# Patient Record
Sex: Female | Born: 1985 | Race: White | Hispanic: No | Marital: Married | Smoking: Never smoker
Health system: Southern US, Community
[De-identification: ages and names within clinical notes are randomized; demographics above are authoritative.]

## PROBLEM LIST (undated history)

## (undated) ENCOUNTER — Inpatient Hospital Stay (HOSPITAL_COMMUNITY): Payer: Self-pay

## (undated) ENCOUNTER — Inpatient Hospital Stay (HOSPITAL_COMMUNITY): Payer: Managed Care, Other (non HMO)

## (undated) DIAGNOSIS — IMO0002 Reserved for concepts with insufficient information to code with codable children: Secondary | ICD-10-CM

## (undated) DIAGNOSIS — O24419 Gestational diabetes mellitus in pregnancy, unspecified control: Secondary | ICD-10-CM

## (undated) DIAGNOSIS — R51 Headache: Secondary | ICD-10-CM

## (undated) DIAGNOSIS — F419 Anxiety disorder, unspecified: Secondary | ICD-10-CM

## (undated) DIAGNOSIS — T4145XA Adverse effect of unspecified anesthetic, initial encounter: Secondary | ICD-10-CM

## (undated) DIAGNOSIS — N83209 Unspecified ovarian cyst, unspecified side: Secondary | ICD-10-CM

## (undated) DIAGNOSIS — R569 Unspecified convulsions: Secondary | ICD-10-CM

## (undated) DIAGNOSIS — G4700 Insomnia, unspecified: Secondary | ICD-10-CM

## (undated) DIAGNOSIS — M5416 Radiculopathy, lumbar region: Secondary | ICD-10-CM

## (undated) DIAGNOSIS — T8859XA Other complications of anesthesia, initial encounter: Secondary | ICD-10-CM

## (undated) DIAGNOSIS — N390 Urinary tract infection, site not specified: Secondary | ICD-10-CM

## (undated) DIAGNOSIS — R87612 Low grade squamous intraepithelial lesion on cytologic smear of cervix (LGSIL): Secondary | ICD-10-CM

## (undated) HISTORY — PX: CYST EXCISION: SHX5701

## (undated) HISTORY — PX: OTHER SURGICAL HISTORY: SHX169

## (undated) HISTORY — PX: THERAPEUTIC ABORTION: SHX798

## (undated) HISTORY — DX: Anxiety disorder, unspecified: F41.9

---
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
## 2005-01-14 ENCOUNTER — Ambulatory Visit (HOSPITAL_COMMUNITY): Admission: RE | Admit: 2005-01-14 | Discharge: 2005-01-14 | Payer: Self-pay | Admitting: Gynecology

## 2005-01-26 ENCOUNTER — Emergency Department: Payer: Self-pay | Admitting: Emergency Medicine

## 2005-03-13 ENCOUNTER — Ambulatory Visit (HOSPITAL_COMMUNITY): Admission: RE | Admit: 2005-03-13 | Discharge: 2005-03-13 | Payer: Self-pay | Admitting: Gynecology

## 2005-04-04 ENCOUNTER — Observation Stay (HOSPITAL_COMMUNITY): Admission: AD | Admit: 2005-04-04 | Discharge: 2005-04-05 | Payer: Self-pay | Admitting: Gynecology

## 2005-04-14 ENCOUNTER — Inpatient Hospital Stay (HOSPITAL_COMMUNITY): Admission: AD | Admit: 2005-04-14 | Discharge: 2005-04-17 | Payer: Self-pay | Admitting: Gynecology

## 2005-04-14 ENCOUNTER — Observation Stay: Payer: Self-pay

## 2005-04-16 ENCOUNTER — Encounter (INDEPENDENT_AMBULATORY_CARE_PROVIDER_SITE_OTHER): Payer: Self-pay | Admitting: *Deleted

## 2005-12-19 ENCOUNTER — Ambulatory Visit: Payer: Self-pay | Admitting: Family Medicine

## 2005-12-19 ENCOUNTER — Inpatient Hospital Stay (HOSPITAL_COMMUNITY): Admission: AD | Admit: 2005-12-19 | Discharge: 2005-12-20 | Payer: Self-pay | Admitting: Family Medicine

## 2005-12-24 ENCOUNTER — Ambulatory Visit: Payer: Self-pay | Admitting: Family Medicine

## 2006-01-07 ENCOUNTER — Ambulatory Visit: Payer: Self-pay | Admitting: Family Medicine

## 2006-04-15 ENCOUNTER — Encounter: Payer: Self-pay | Admitting: Family Medicine

## 2006-04-15 ENCOUNTER — Ambulatory Visit: Payer: Self-pay | Admitting: Family Medicine

## 2006-07-07 ENCOUNTER — Emergency Department: Payer: Self-pay | Admitting: Emergency Medicine

## 2007-12-25 ENCOUNTER — Emergency Department: Payer: Self-pay | Admitting: Emergency Medicine

## 2009-07-10 ENCOUNTER — Emergency Department: Payer: Self-pay | Admitting: Emergency Medicine

## 2009-08-16 ENCOUNTER — Ambulatory Visit: Payer: Self-pay | Admitting: Podiatrist

## 2010-09-02 NOTE — L&D Delivery Note (Signed)
Delivery Note At  a viable female was delivered via NSVD (Presentation: ROA  ).  APGAR: , ; weight .   Placenta status:  intact, spontaneous.  Cord: 3-vessel with the following complications: none.   Anesthesia: epidural   Episiotomy: n/a Lacerations: none Est. Blood Loss (mL):  Mom to postpartum.  Baby to NICU.  BOOTH, Gerron Guidotti 04/21/2011, 4:09 PM

## 2010-09-23 ENCOUNTER — Encounter: Payer: Self-pay | Admitting: Gynecology

## 2010-09-27 ENCOUNTER — Ambulatory Visit
Admission: RE | Admit: 2010-09-27 | Discharge: 2010-09-27 | Payer: Self-pay | Source: Home / Self Care | Attending: Obstetrics & Gynecology | Admitting: Obstetrics & Gynecology

## 2010-09-27 ENCOUNTER — Encounter: Payer: Self-pay | Admitting: Obstetrics & Gynecology

## 2010-09-27 ENCOUNTER — Ambulatory Visit: Admit: 2010-09-27 | Payer: Self-pay | Admitting: Obstetrics & Gynecology

## 2010-09-27 LAB — CONVERTED CEMR LAB: hCG, Beta Chain, Quant, S: 1688 milliintl units/mL

## 2010-10-01 ENCOUNTER — Ambulatory Visit: Admit: 2010-10-01 | Payer: Self-pay | Admitting: Obstetrics & Gynecology

## 2010-10-02 ENCOUNTER — Encounter: Payer: Self-pay | Admitting: Obstetrics and Gynecology

## 2010-10-02 LAB — CONVERTED CEMR LAB: hCG, Beta Chain, Quant, S: 5128.7 milliintl units/mL

## 2010-10-18 ENCOUNTER — Encounter: Payer: Private Health Insurance - Indemnity | Admitting: Obstetrics & Gynecology

## 2010-10-18 ENCOUNTER — Other Ambulatory Visit: Payer: Self-pay | Admitting: Obstetrics and Gynecology

## 2010-10-18 DIAGNOSIS — Z3480 Encounter for supervision of other normal pregnancy, unspecified trimester: Secondary | ICD-10-CM

## 2010-10-18 DIAGNOSIS — Z113 Encounter for screening for infections with a predominantly sexual mode of transmission: Secondary | ICD-10-CM

## 2010-10-18 DIAGNOSIS — Z1272 Encounter for screening for malignant neoplasm of vagina: Secondary | ICD-10-CM

## 2010-10-19 ENCOUNTER — Encounter: Payer: Self-pay | Admitting: Obstetrics and Gynecology

## 2010-10-19 LAB — CONVERTED CEMR LAB
Antibody Screen: NEGATIVE
Anticardiolipin IgM: 1 (ref <11)
Basophils Absolute: 0 10*3/uL (ref 0.0–0.1)
Basophils Relative: 0 % (ref 0–1)
Eosinophils Relative: 0 % (ref 0–5)
Hemoglobin: 12.5 g/dL (ref 12.0–15.0)
Hepatitis B Surface Ag: NEGATIVE
Lymphs Abs: 1 10*3/uL (ref 0.7–4.0)
Monocytes Absolute: 0.5 10*3/uL (ref 0.1–1.0)
Monocytes Relative: 7 % (ref 3–12)
Neutro Abs: 5.2 10*3/uL (ref 1.7–7.7)

## 2010-10-30 ENCOUNTER — Other Ambulatory Visit: Payer: Self-pay | Admitting: Family Medicine

## 2010-10-30 DIAGNOSIS — O3680X0 Pregnancy with inconclusive fetal viability, not applicable or unspecified: Secondary | ICD-10-CM

## 2010-11-01 ENCOUNTER — Encounter (HOSPITAL_COMMUNITY): Payer: Self-pay

## 2010-11-01 ENCOUNTER — Ambulatory Visit (HOSPITAL_COMMUNITY)
Admission: RE | Admit: 2010-11-01 | Discharge: 2010-11-01 | Disposition: A | Discharge status: Home / Self Care | Payer: Private Health Insurance - Indemnity | Source: Ambulatory Visit | Attending: Family Medicine | Admitting: Family Medicine

## 2010-11-01 ENCOUNTER — Other Ambulatory Visit: Payer: Self-pay | Admitting: Family Medicine

## 2010-11-01 ENCOUNTER — Encounter: Payer: Private Health Insurance - Indemnity | Admitting: Obstetrics & Gynecology

## 2010-11-01 DIAGNOSIS — O3680X0 Pregnancy with inconclusive fetal viability, not applicable or unspecified: Secondary | ICD-10-CM

## 2010-11-01 DIAGNOSIS — O09219 Supervision of pregnancy with history of pre-term labor, unspecified trimester: Secondary | ICD-10-CM

## 2010-11-01 DIAGNOSIS — Z3689 Encounter for other specified antenatal screening: Secondary | ICD-10-CM | POA: Insufficient documentation

## 2010-11-07 ENCOUNTER — Other Ambulatory Visit: Payer: Self-pay | Admitting: Family Medicine

## 2010-11-07 ENCOUNTER — Ambulatory Visit (HOSPITAL_COMMUNITY): Admission: RE | Admit: 2010-11-07 | Payer: Private Health Insurance - Indemnity | Source: Ambulatory Visit

## 2010-11-07 ENCOUNTER — Ambulatory Visit (HOSPITAL_COMMUNITY)
Admission: RE | Admit: 2010-11-07 | Discharge: 2010-11-07 | Disposition: A | Discharge status: Home / Self Care | Payer: Private Health Insurance - Indemnity | Source: Ambulatory Visit | Attending: Family Medicine | Admitting: Family Medicine

## 2010-11-07 DIAGNOSIS — O3510X0 Maternal care for (suspected) chromosomal abnormality in fetus, unspecified, not applicable or unspecified: Secondary | ICD-10-CM | POA: Insufficient documentation

## 2010-11-07 DIAGNOSIS — O351XX0 Maternal care for (suspected) chromosomal abnormality in fetus, not applicable or unspecified: Secondary | ICD-10-CM | POA: Insufficient documentation

## 2010-11-07 DIAGNOSIS — O3680X0 Pregnancy with inconclusive fetal viability, not applicable or unspecified: Secondary | ICD-10-CM

## 2010-11-07 DIAGNOSIS — Z3689 Encounter for other specified antenatal screening: Secondary | ICD-10-CM | POA: Insufficient documentation

## 2010-11-20 ENCOUNTER — Encounter: Payer: Self-pay | Admitting: Obstetrics & Gynecology

## 2010-11-20 ENCOUNTER — Encounter: Payer: Private Health Insurance - Indemnity | Admitting: Nurse Practitioner

## 2010-11-20 DIAGNOSIS — R51 Headache: Secondary | ICD-10-CM

## 2010-11-20 DIAGNOSIS — O09219 Supervision of pregnancy with history of pre-term labor, unspecified trimester: Secondary | ICD-10-CM

## 2010-11-20 LAB — CONVERTED CEMR LAB
Albumin: 4 g/dL (ref 3.5–5.2)
Alkaline Phosphatase: 53 units/L (ref 39–117)
BUN: 8 mg/dL (ref 6–23)
Creatinine, Ser: 0.52 mg/dL (ref 0.40–1.20)
Glucose, Bld: 81 mg/dL (ref 70–99)
HCT: 36.9 % (ref 36.0–46.0)
Hemoglobin: 12.6 g/dL (ref 12.0–15.0)
MCV: 89.1 fL (ref 78.0–100.0)
Potassium: 4 meq/L (ref 3.5–5.3)
RBC: 4.14 M/uL (ref 3.87–5.11)
Sodium: 136 meq/L (ref 135–145)
Total Bilirubin: 0.4 mg/dL (ref 0.3–1.2)
WBC: 5.9 10*3/uL (ref 4.0–10.5)

## 2010-11-22 ENCOUNTER — Encounter: Payer: Private Health Insurance - Indemnity | Admitting: Obstetrics & Gynecology

## 2010-11-22 DIAGNOSIS — Z3480 Encounter for supervision of other normal pregnancy, unspecified trimester: Secondary | ICD-10-CM

## 2010-11-23 ENCOUNTER — Ambulatory Visit (HOSPITAL_COMMUNITY): Payer: Private Health Insurance - Indemnity

## 2010-11-23 ENCOUNTER — Other Ambulatory Visit (HOSPITAL_COMMUNITY): Payer: Private Health Insurance - Indemnity

## 2010-11-29 ENCOUNTER — Encounter: Payer: Private Health Insurance - Indemnity | Admitting: Obstetrics & Gynecology

## 2010-11-29 DIAGNOSIS — O09299 Supervision of pregnancy with other poor reproductive or obstetric history, unspecified trimester: Secondary | ICD-10-CM

## 2010-12-13 ENCOUNTER — Other Ambulatory Visit: Payer: Self-pay | Admitting: Family Medicine

## 2010-12-13 ENCOUNTER — Encounter: Payer: Private Health Insurance - Indemnity | Admitting: Obstetrics & Gynecology

## 2010-12-13 DIAGNOSIS — Z3689 Encounter for other specified antenatal screening: Secondary | ICD-10-CM

## 2010-12-13 DIAGNOSIS — O09219 Supervision of pregnancy with history of pre-term labor, unspecified trimester: Secondary | ICD-10-CM

## 2010-12-20 ENCOUNTER — Other Ambulatory Visit: Payer: Private Health Insurance - Indemnity

## 2010-12-20 DIAGNOSIS — O09219 Supervision of pregnancy with history of pre-term labor, unspecified trimester: Secondary | ICD-10-CM

## 2010-12-24 ENCOUNTER — Inpatient Hospital Stay (HOSPITAL_COMMUNITY): Payer: Private Health Insurance - Indemnity

## 2010-12-24 ENCOUNTER — Inpatient Hospital Stay (HOSPITAL_COMMUNITY)
Admission: AD | Admit: 2010-12-24 | Discharge: 2010-12-24 | Disposition: A | Discharge status: Home / Self Care | Payer: Private Health Insurance - Indemnity | Source: Ambulatory Visit | Attending: Obstetrics & Gynecology | Admitting: Obstetrics & Gynecology

## 2010-12-24 DIAGNOSIS — O209 Hemorrhage in early pregnancy, unspecified: Secondary | ICD-10-CM | POA: Insufficient documentation

## 2010-12-24 LAB — URINALYSIS, ROUTINE W REFLEX MICROSCOPIC
Bilirubin Urine: NEGATIVE
Leukocytes, UA: NEGATIVE
Specific Gravity, Urine: 1.015 (ref 1.005–1.030)
pH: 6 (ref 5.0–8.0)

## 2010-12-24 LAB — CBC
Hemoglobin: 11.3 g/dL — ABNORMAL LOW (ref 12.0–15.0)
MCHC: 35.3 g/dL (ref 30.0–36.0)
RBC: 3.61 MIL/uL — ABNORMAL LOW (ref 3.87–5.11)

## 2010-12-24 LAB — WET PREP, GENITAL
Clue Cells Wet Prep HPF POC: NONE SEEN
Trich, Wet Prep: NONE SEEN
Yeast Wet Prep HPF POC: NONE SEEN

## 2010-12-24 LAB — URINE MICROSCOPIC-ADD ON

## 2010-12-25 ENCOUNTER — Encounter: Payer: Private Health Insurance - Indemnity | Admitting: Obstetrics and Gynecology

## 2010-12-25 DIAGNOSIS — O09299 Supervision of pregnancy with other poor reproductive or obstetric history, unspecified trimester: Secondary | ICD-10-CM

## 2011-01-04 ENCOUNTER — Ambulatory Visit (HOSPITAL_COMMUNITY)
Admission: RE | Admit: 2011-01-04 | Discharge: 2011-01-04 | Disposition: A | Discharge status: Home / Self Care | Payer: Private Health Insurance - Indemnity | Source: Ambulatory Visit | Attending: Family Medicine | Admitting: Family Medicine

## 2011-01-04 ENCOUNTER — Ambulatory Visit: Payer: Private Health Insurance - Indemnity

## 2011-01-04 DIAGNOSIS — Z3689 Encounter for other specified antenatal screening: Secondary | ICD-10-CM

## 2011-01-04 DIAGNOSIS — O358XX0 Maternal care for other (suspected) fetal abnormality and damage, not applicable or unspecified: Secondary | ICD-10-CM | POA: Insufficient documentation

## 2011-01-04 DIAGNOSIS — Z363 Encounter for antenatal screening for malformations: Secondary | ICD-10-CM | POA: Insufficient documentation

## 2011-01-04 DIAGNOSIS — Z1389 Encounter for screening for other disorder: Secondary | ICD-10-CM | POA: Insufficient documentation

## 2011-01-08 ENCOUNTER — Encounter (INDEPENDENT_AMBULATORY_CARE_PROVIDER_SITE_OTHER): Payer: Private Health Insurance - Indemnity | Admitting: Obstetrics and Gynecology

## 2011-01-08 DIAGNOSIS — Z3480 Encounter for supervision of other normal pregnancy, unspecified trimester: Secondary | ICD-10-CM

## 2011-01-08 DIAGNOSIS — O09219 Supervision of pregnancy with history of pre-term labor, unspecified trimester: Secondary | ICD-10-CM

## 2011-01-17 ENCOUNTER — Ambulatory Visit (INDEPENDENT_AMBULATORY_CARE_PROVIDER_SITE_OTHER): Payer: Private Health Insurance - Indemnity

## 2011-01-17 DIAGNOSIS — O09219 Supervision of pregnancy with history of pre-term labor, unspecified trimester: Secondary | ICD-10-CM

## 2011-01-18 NOTE — Discharge Summary (Signed)
[name], [name]               ACCOUNT NO.:  [id number]   MEDICAL RECORD NO.:  [id number]          PATIENT TYPE:  INP   LOCATION:  9310                          [facility]ILITY[facility]  [facility]   PHYSICIAN:  Tanya S. Shawnie Pons, M.D.   DATE OF BIRTH:  10-Feb-1986   DATE OF ADMISSION:  12/19/2005  DATE OF DISCHARGE:  12/20/2005                                 DISCHARGE SUMMARY   FINAL DIAGNOSES:  1.  Acute abdominal pain, left lower quadrant.  2.  Status post SAB on December 16, 2005.   PERTINENT LABORATORY:  Include hemoglobin initially of 12.5.  A hemoglobin  on the second day of 11.5, after IV fluid hydration.  Beta HCG on date of  admission of 449.  Beta HCG on day of discharge 256.  Ultrasound that showed  some hypoechoic lesion in the left adnexa that was not consistent with  ectopic pregnancy.  A flat and upright of the abdomen was negative.   Additional information:  The abortion clinic was called and confirmed the  presence of an intrauterine gestational sack at the time of her procedure.   REASON FOR ADMISSION:  Briefly, the patient is a 25 year old para 1 who has  an 34-month-old child who had a TAB at [facility] on Monday, three days  prior to admission.  She developed acute left lower quadrant pain and came  into the hospital by EMS on the day of admission.  The patient had fairly  dramatic pain was in tears with a lot of crying, had pain on exam and  ultrasound, however, she had no significant elevation of her white blood  cell count.  She had no fever.  She had stable vital signs.  She was not  orthostatic.  But given the severity of her pain and the need to rule out a  more serious complication related to her procedure, it was felt she should  be admitted for observation.   HOSPITAL COURSE:  The patient was admitted for observation for 24 hours.  During that time she was kept NPO in case the need for surgery arose.  All  labs are as above and then her CBC and beta HCG were repeated on  her second  hospital day.  They were felt to be normal.  And, her beta HCG was dropping  appropriately in 24 hours.  The patient continued to have pain and had  required some morphine but was hungry and felt good.  She was instructed to  return with fevers, chills, nausea, vomiting, or increasing pain.  She  states she could get back to the hospital if need be.   The patient is also asked to return for followup at [facility] on December 24, 2005 at 3:15.   The patient was tolerating a regular diet prior to discharge.           ______________________________  Shelbie Proctor. Shawnie Pons, M.D.     TSP/MEDQ  D:  12/20/2005  T:  12/21/2005  Job:  [id number]

## 2011-01-18 NOTE — H&P (Signed)
[name], [name]               ACCOUNT NO.:  [id number]   MEDICAL RECORD NO.:  [id number]           PATIENT TYPE:   LOCATION:                                [facility]   PHYSICIAN:  Ginger Carne, MD  DATE OF BIRTH:  1986/01/30   DATE OF ADMISSION:  DATE OF DISCHARGE:                                HISTORY & PHYSICAL   REASON FOR ADMISSION:  Spotting and 90% effacement of cervix at 53 and 2/[redacted]  weeks gestation.   HISTORY OF PRESENT ILLNESS:  This patient is a 25 year old, gravida 1, para  53, Caucasian female, [facility] June 03, 2005 by last menstrual period of November 26, 2004 and three serial ultrasounds. The patient began cramping and  spotting approximately five days prior to her admission. The patient states  that she had continued to cramp and spot until today and noted that there  was increasing pressure in her pelvis.   The patient is A positive, nonimmune to rubella. At 15 weeks, she was  diagnosed with a low lying placenta which has since gravitated away by her  28 week scan. Her total weight gain for the pregnancy is 15 pounds.   OB/GYN HISTORY:  This is the first pregnancy for this patient.   CURRENT MEDICATIONS:  Prenatal vitamins.   ALLERGIES:  None.   PAST MEDICAL HISTORY:  Negative.   PAST SURGICAL HISTORY:  Negative.   SOCIAL HISTORY:  The patient smokes one pack of cigarettes a day. She states  that she had utilized cocaine within six months prior to her prior first  office visit on November 26, 2004. She denies current use. She does not abuse  alcohol.   FAMILY HISTORY:  Noncontributory.   REVIEW OF SYMPTOMS:  Negative.   PHYSICAL EXAMINATION:  VITAL SIGNS:  Height is 5 foot 7 inches, weight 168  pounds, blood pressure 90/60.  HEENT:  Grossly normal.  BREASTS:  Without masses, discharge, thickenings or tenderness.  CHEST:  Clear to percussion and auscultation.  CARDIOVASCULAR:  Without murmurs or enlargements, regular rate and rhythm.  EXTREMITIES/LYMPHATIC/SKIN/NEUROLOGICAL/MUSCULOSKELETAL SYSTEMS:  All within  normal limits.  ABDOMEN:  Measures to 32 cm and no palpable contractions noted.  PELVIC:  External genitalia, vulva and vaginal normal. Cervix is smooth  without erosions or lesions, 90% effaced and closed. Adnexa are nontender.   IMPRESSION:  Possible preterm labor with 90% effacement.   PLAN:  The patient will be put on bedrest, routine lab work obtained. She  will be started on Unasyn 3 g IV q.6 h and azithromycin 500 mg IV every 24 h  and be monitored for contractions and labor pattern. In addition, an  obstetrical sonogram will be obtained and possible consultation by high risk  obstetrical service.       SHB/MEDQ  D:  04/04/2005  T:  04/04/2005  Job:  [id number]

## 2011-01-18 NOTE — Group Therapy Note (Signed)
NAMEALANNAH, [name]               ACCOUNT NO.:  [id number]   MEDICAL RECORD NO.:  [id number]          PATIENT TYPE:  WOC   LOCATION:  [facility]                   FACILITY[facility]   PHYSICIAN:  Tinnie Gens, MD        DATE OF BIRTH:  1985-10-04   DATE OF SERVICE:  12/24/2005                                    CLINIC NOTE   The patient was seen at [facility].   CHIEF COMPLAINT:  Hostile thoughts.   HISTORY OF PRESENT ILLNESS:  This patient is a 25 year old para 1 who was  previously admitted to the hospital on December 19, 2005 and December 20, 2005 for  acute abdominal pain after a TAB.  The patient had serial exams, serial CBCs  and serial beta HCGs all of which indicated no significant pathology.  The  patient had a KUB that was normal and a pelvic ultrasound that was not  consistent with an ectopic pregnancy.  The patient did well but continues to  have left lower quadrant pain.  It is unclear exactly where her pain is  coming from although she has expressed a lot of guilt related to her TAB  previously.  She feels like she cannot go to work.  She cannot stand up very  long.   PHYSICAL EXAMINATION:  VITAL SIGNS:  Her vitals are as noted in the chart.  Blood pressure is up at141/97 .  Her pulse is 99.  Weight is 170 pounds.  PELVIC:  She has normal external female genitalia..  The cervix is tender,  but there is no significant discharge noted.  The uterus and pelvis are also  diffusely tender.  There is no significant abdominal rebound or pelvic  rebound noted.   IMPRESSION:  1.  Status post spontaneous abortion.  2.  Left lower quadrant pain, question related to guilt over the procedure      versus other, have ruled out critical issues for this patient.   PLAN:  1.  We will give her a trial of doxycycline for two weeks and have her      follow up at that point to see if her symptoms have improved.  2.  Order HCG today.           ______________________________  Tinnie Gens,  MD     TP/MEDQ  D:  12/24/2005  T:  12/25/2005  Job:  [id number]

## 2011-01-18 NOTE — Assessment & Plan Note (Signed)
[name], [name]               ACCOUNT NO.:  [id number]   MEDICAL RECORD NO.:  [id number]          PATIENT TYPE:  POB   LOCATION:  [facility]         FACILITY:  [facility]   PHYSICIAN:  Tinnie Gens, MD        DATE OF BIRTH:  1986-03-29   DATE OF SERVICE:  04/15/2006                                    CLINIC NOTE   CHIEF COMPLAINT:  Yearly exam.   HISTORY OF PRESENT ILLNESS:  The patient is a 25 year old, gravida 2, para 1-  0-1-1, who had a TAB earlier this year, and since that time came in for IUD  insertion at her last visit, on March 18, 2006.  She is here today for IUD  check, as well as yearly exam.  She has no significant complaints.  She has  had some dark brown spotting since IUD insertion, and she has noted some  clear discharge from her right nipple.  No bloody discharge noted.   PAST MEDICAL HISTORY:  Negative.   PAST SURGICAL HISTORY:  Negative.   ALLERGIES:  NONE KNOWN.   MEDICATIONS:  None.   OBSTETRICAL HISTORY:  Patient had one previous delivery, one TAB.   GYNECOLOGIC HISTORY:  Negative.   FAMILY HISTORY:  Negative.   SOCIAL HISTORY:  She has a long ago history of cocaine use, marijuana use,  tobacco use.   A 14-point review of systems is reviewed and is negative except as in HPI.  She denies headache, visual changes, difficulty swallowing, nausea,  vomiting, diarrhea, constipation, fever, chills, dysuria, lower extremity  swelling, change in vision, change in hearing, vaginal discharge except as  in HPI, or abnormal uterine bleeding.   EXAMINATION:  Her vitals are as noted in the chart.  Blood pressure 106/62,  weight is 167.  She is a well-developed, well-nourished female.  Neck supple with normal thyroid.  Lungs are clear bilaterally.  CV:  Regular rate and rhythm, no rubs, gallops or murmurs.  ABDOMEN:  Soft, nontender and nondistended, no masses.  Breasts are symmetric with everted nipples.  No masses or supraclavicular or  axillary  adenopathy.  GU:  Normal external female genitalia.  BUS normal.  Vaginal is pink and  rugated.  Cervix is parous and without lesion.  IUD strings are visualized.  The uterus is anteverted and small and firm.  The adnexa are without masses  or tenderness.   Urine pregnancy test is negative today.   IMPRESSION:  1. Yearly exam.  2. Pap smear today.  GC and Chlamydia test today.  3. IUD in place, continue for next year.  Follow up as needed.           ______________________________  Tinnie Gens, MD     TP/MEDQ  D:  04/15/2006  T:  04/16/2006  Job:  [id number]

## 2011-01-18 NOTE — Discharge Summary (Signed)
[name], SCALF               ACCOUNT NO.:  [id number]   MEDICAL RECORD NO.:  [id number]          PATIENT TYPE:  INP   LOCATION:  9129                          [facility]   PHYSICIAN:  Ginger Carne, MD  DATE OF BIRTH:  03-01-1986   DATE OF ADMISSION:  04/14/2005  DATE OF DISCHARGE:                                 DISCHARGE SUMMARY   REASON FOR HOSPITALIZATION:  Vaginal bleeding, back pain and uterine  contractions at 32-6/[redacted] weeks gestation.   FINAL DIAGNOSIS:  Preterm viable delivery of female infant at 33-1/[redacted] weeks  gestation and abruptio placenta.   IN HOSPITAL PROCEDURES:  Magnesium sulfate, tocolysis, betamethasone steroid  lung maturation protocol and antibiotic therapy.   HOSPITAL COURSE:  This patient is a 25 year old gravida 1, para 0, 32-6/[redacted]  weeks gestation, Caucasian female with estimated date of confinement June 03, 2005 based on serial ultrasonography and last menstrual period who  presented to the Maternity Admission Unit on April 14, 2005 with a vertex  presentation, vaginal bleeding with cervix approximately 4-5 cm. Membranes  were intact and the patient demonstrated uterine contractions approximately  3-5 minutes apart. The patient's gonorrhea and chlamydia cultures were  previously negative, group B strep culture was not performed until 35-36  weeks and was not available and therefore was not available.   The patient has had several episodes of hospitalization and observation at  the [facility] [place] because of uterine irritability, a cervix  that was less than 1 cm and a bulging lower uterine segment with spotting.  Although an abruption could not be identified on previous ultrasounds prior  to this admission, it was felt that a marginal abruption could have caused  said thinning of the cervix with spotting.   The patient was placed on strict bedrest, ampicillin for group B strep  prophylaxis. Group B strep culture was obtained at  the time of admission.  Ampicillin was changed to Unasyn and azithromycin intravenously. Magnesium  sulfate was utilized for tocolysis.  Discussion with the NICU also ensued.  Betamethasone over 24 hours was also prescribed to said patient for lung  maturation. The patient completed her last course of betamethasone on the  evening of April 15, 2005. An ultrasound obtained during her  hospitalization revealed no evidence of an abruption, vertex presentation,  intact membranes and an estimated fetal weight in the 90th percentile  between 1920 grams and 2070 grams.   The patient began spontaneous contractions in labor in the early afternoon  of April 16, 2005. She was taken to labor and delivery at which time she  was noted to be about 4-5 cm, 100% effaced. Magnesium sulfate was  terminated, artificial rupture of membranes followed and the patient  delivered a preterm female on April 16, 2005. No gross abnormalities, baby  cried spontaneously delivery. A marginal cord insertion with a placenta 20%  marginal abruption was noted. Three-vessel cord. NICU was present. Weight  2325 grams with Apgar scores of 7/8.   The patient's postpartum course was uneventful, she was afebrile, voided  well, perineum was without tenderness. Fundus  was firm, hemoglobin was 12.0  and hematocrit 33.7 postpartum. The patient was discharged with routine  instructions including to contact the office for temperature elevation above  100.4 degrees Fahrenheit, increasing vaginal bleeding or abdominal pain. The  patient is planning to breast feed. The patient will be seen back in the  office in 6 weeks time and advised to utilize ibuprofen 600 mg up to three  times a day for analgesia.      Ginger Carne, MD  Electronically Signed     SHB/MEDQ  D:  04/17/2005  T:  04/17/2005  Job:  [id number]

## 2011-01-18 NOTE — Discharge Summary (Signed)
[name], [name]               ACCOUNT NO.:  [id number]   MEDICAL RECORD NO.:  [id number]          PATIENT TYPE:  INP   LOCATION:  9155                          [facility]   PHYSICIAN:  Ginger Carne, MD  DATE OF BIRTH:  12/10/1985   DATE OF ADMISSION:  04/04/2005  DATE OF DISCHARGE:  04/05/2005                                 DISCHARGE SUMMARY   ADDENDUM   Urinalysis returned prior to discharge demonstrating large quantities of  white blood cells in the urine.  Therefore, the patient will be prescribed  Macrobid 100 mg b.i.d. for seven days.       SHB/MEDQ  D:  04/05/2005  T:  04/05/2005  Job:  [id number]

## 2011-01-18 NOTE — Discharge Summary (Signed)
NAMESORREL, [name]               ACCOUNT NO.:  [id number]   MEDICAL RECORD NO.:  [id number]          PATIENT TYPE:  INP   LOCATION:  9155                          [facility]   PHYSICIAN:  Ginger Carne, MD  DATE OF BIRTH:  15-Jan-1986   DATE OF ADMISSION:  04/04/2005  DATE OF DISCHARGE:                                 DISCHARGE SUMMARY   REASON FOR HOSPITALIZATION:  Pelvic pressure and 90% effaced cervix with  spotting.   IN HOSPITAL PROCEDURES:  1.  Bed rest.  2.  Antibiotics therapy with Unasyn and azithromycin.  3.  Ultrasound.   FINAL DIAGNOSES:  31 2/[redacted] weeks gestation with uterine irritability and  effacement.   HISTORY OF PRESENT ILLNESS:  This patient is a 25 year old gravida 1, para 60  Caucasian female, 17 2/[redacted] weeks gestation by LMP and ultrasounds, who  presented to the hospital on 04/04/05 with a five day history of worsening  pelvic pressure, spotting and uterine irritability.  The patient was seen in  the office and noted to have a cervix that was 90% effaced with spotting.  She had had two prior ultrasounds, at 15 weeks and 20 weeks, which had  demonstrated that a low lying placenta had moved at least 2 cm away from the  endocervical os.  This was again confirmed at 28 weeks.  The patient denies  increasing lower back pain, urinary tract symptomatology or other similar  complaints.  Her urinalysis was normal in the office.   The patient underwent an ultrasound examination on 04/04/05 at the [facility] at [place].  The cervix was noted to be 2.5 cm without evidence  of abruption or other abnormalities.  Monitoring revealed no evidence of  contractility of the uterus with an occasional episode of irritability.  Laboratory work revealed no evidence for abnormalities related to [facility] and on  the morning of said discharge, the same labs were within normal limits.   Drug screen was negative.  On the morning of her discharge, the patient  reported no  evidence for further spotting or bleeding.  The cervix appeared  to have lengthened to approximately 3 cm.  The abdomen was soft and the baby  was active. Baseline was 135, with 5-25 irritability.  It was deemed  appropriate not to place the patient on a course of betamethasone, because  the possibility at this time of labor seems to be remote.  Unasyn 3 grams IV  q.6h. and azithromycin 500 mg IV q.24h. was started on the patient's  admission.   The patient was discharged with instructions to have bed rest with  ambulation limited.  She has stopped working and was advised to contact the  office if further irritability of the uterus, bleeding, spotting, increasing  pelvic pressure or any concerns would warrant a telephone call to the  office.  She was also advised to contact the office for urinary tract  symptomatology.  She will continue her prenatal vitamins and be seen on  04/09/05.     SHB/MEDQ  D:  04/05/2005  T:  04/05/2005  Job:  [id number] 

## 2011-01-24 ENCOUNTER — Ambulatory Visit (INDEPENDENT_AMBULATORY_CARE_PROVIDER_SITE_OTHER): Payer: Private Health Insurance - Indemnity

## 2011-01-24 DIAGNOSIS — O09219 Supervision of pregnancy with history of pre-term labor, unspecified trimester: Secondary | ICD-10-CM

## 2011-02-01 ENCOUNTER — Other Ambulatory Visit: Payer: Private Health Insurance - Indemnity

## 2011-02-04 ENCOUNTER — Encounter (INDEPENDENT_AMBULATORY_CARE_PROVIDER_SITE_OTHER): Payer: Private Health Insurance - Indemnity | Admitting: Obstetrics and Gynecology

## 2011-02-04 DIAGNOSIS — Z3480 Encounter for supervision of other normal pregnancy, unspecified trimester: Secondary | ICD-10-CM

## 2011-02-05 ENCOUNTER — Encounter: Payer: Private Health Insurance - Indemnity | Admitting: Family Medicine

## 2011-02-07 ENCOUNTER — Other Ambulatory Visit: Payer: Private Health Insurance - Indemnity

## 2011-02-11 ENCOUNTER — Inpatient Hospital Stay (HOSPITAL_COMMUNITY)
Admission: AD | Admit: 2011-02-11 | Discharge: 2011-02-11 | Disposition: A | Discharge status: Home / Self Care | Payer: Private Health Insurance - Indemnity | Source: Ambulatory Visit | Attending: Obstetrics & Gynecology | Admitting: Obstetrics & Gynecology

## 2011-02-11 ENCOUNTER — Other Ambulatory Visit (INDEPENDENT_AMBULATORY_CARE_PROVIDER_SITE_OTHER): Payer: Private Health Insurance - Indemnity

## 2011-02-11 DIAGNOSIS — N390 Urinary tract infection, site not specified: Secondary | ICD-10-CM

## 2011-02-11 DIAGNOSIS — O2390 Unspecified genitourinary tract infection in pregnancy, unspecified trimester: Secondary | ICD-10-CM | POA: Insufficient documentation

## 2011-02-11 DIAGNOSIS — O09219 Supervision of pregnancy with history of pre-term labor, unspecified trimester: Secondary | ICD-10-CM

## 2011-02-11 DIAGNOSIS — O4700 False labor before 37 completed weeks of gestation, unspecified trimester: Secondary | ICD-10-CM | POA: Insufficient documentation

## 2011-02-11 LAB — URINALYSIS, ROUTINE W REFLEX MICROSCOPIC
Bilirubin Urine: NEGATIVE
Specific Gravity, Urine: 1.02 (ref 1.005–1.030)
pH: 6 (ref 5.0–8.0)

## 2011-02-11 LAB — WET PREP, GENITAL
Trich, Wet Prep: NONE SEEN
Yeast Wet Prep HPF POC: NONE SEEN

## 2011-02-11 LAB — URINE MICROSCOPIC-ADD ON

## 2011-02-12 LAB — URINE CULTURE
Colony Count: NO GROWTH
Culture  Setup Time: 201206111348
Culture: NO GROWTH

## 2011-02-13 ENCOUNTER — Encounter (INDEPENDENT_AMBULATORY_CARE_PROVIDER_SITE_OTHER): Payer: Private Health Insurance - Indemnity | Admitting: Obstetrics and Gynecology

## 2011-02-13 DIAGNOSIS — Z3480 Encounter for supervision of other normal pregnancy, unspecified trimester: Secondary | ICD-10-CM

## 2011-02-13 DIAGNOSIS — O4700 False labor before 37 completed weeks of gestation, unspecified trimester: Secondary | ICD-10-CM

## 2011-02-14 ENCOUNTER — Ambulatory Visit (INDEPENDENT_AMBULATORY_CARE_PROVIDER_SITE_OTHER): Payer: Private Health Insurance - Indemnity

## 2011-02-14 DIAGNOSIS — Z3041 Encounter for surveillance of contraceptive pills: Secondary | ICD-10-CM

## 2011-02-15 ENCOUNTER — Other Ambulatory Visit: Payer: Private Health Insurance - Indemnity

## 2011-02-21 ENCOUNTER — Encounter (INDEPENDENT_AMBULATORY_CARE_PROVIDER_SITE_OTHER): Payer: Private Health Insurance - Indemnity | Admitting: Obstetrics & Gynecology

## 2011-02-21 DIAGNOSIS — O09219 Supervision of pregnancy with history of pre-term labor, unspecified trimester: Secondary | ICD-10-CM

## 2011-02-28 ENCOUNTER — Ambulatory Visit (INDEPENDENT_AMBULATORY_CARE_PROVIDER_SITE_OTHER): Payer: Private Health Insurance - Indemnity

## 2011-02-28 ENCOUNTER — Ambulatory Visit: Payer: Private Health Insurance - Indemnity

## 2011-02-28 DIAGNOSIS — O09219 Supervision of pregnancy with history of pre-term labor, unspecified trimester: Secondary | ICD-10-CM

## 2011-03-04 ENCOUNTER — Encounter: Payer: Private Health Insurance - Indemnity | Admitting: Obstetrics and Gynecology

## 2011-03-07 ENCOUNTER — Encounter (INDEPENDENT_AMBULATORY_CARE_PROVIDER_SITE_OTHER): Payer: Private Health Insurance - Indemnity | Admitting: Family Medicine

## 2011-03-07 DIAGNOSIS — O09219 Supervision of pregnancy with history of pre-term labor, unspecified trimester: Secondary | ICD-10-CM

## 2011-03-13 ENCOUNTER — Ambulatory Visit (INDEPENDENT_AMBULATORY_CARE_PROVIDER_SITE_OTHER): Payer: Private Health Insurance - Indemnity | Admitting: Gynecology

## 2011-03-13 MED ORDER — HYDROXYPROGESTERONE CAPROATE 250 MG/ML IM OIL
250.0000 mg | TOPICAL_OIL | Freq: Once | INTRAMUSCULAR | Status: AC
  Administered 2011-03-13: 250 mg via INTRAMUSCULAR

## 2011-03-19 ENCOUNTER — Other Ambulatory Visit: Payer: Self-pay | Admitting: Family Medicine

## 2011-03-19 ENCOUNTER — Encounter (INDEPENDENT_AMBULATORY_CARE_PROVIDER_SITE_OTHER): Payer: Private Health Insurance - Indemnity | Admitting: Family Medicine

## 2011-03-19 DIAGNOSIS — Z113 Encounter for screening for infections with a predominantly sexual mode of transmission: Secondary | ICD-10-CM

## 2011-03-19 DIAGNOSIS — O09219 Supervision of pregnancy with history of pre-term labor, unspecified trimester: Secondary | ICD-10-CM

## 2011-03-20 ENCOUNTER — Inpatient Hospital Stay (HOSPITAL_COMMUNITY)
Admission: AD | Admit: 2011-03-20 | Discharge: 2011-03-20 | Disposition: A | Discharge status: Home / Self Care | Payer: Private Health Insurance - Indemnity | Source: Ambulatory Visit | Attending: Obstetrics and Gynecology | Admitting: Obstetrics and Gynecology

## 2011-03-20 ENCOUNTER — Encounter (HOSPITAL_COMMUNITY): Payer: Self-pay | Admitting: *Deleted

## 2011-03-20 DIAGNOSIS — O4700 False labor before 37 completed weeks of gestation, unspecified trimester: Secondary | ICD-10-CM | POA: Diagnosis present

## 2011-03-20 DIAGNOSIS — O09219 Supervision of pregnancy with history of pre-term labor, unspecified trimester: Secondary | ICD-10-CM

## 2011-03-20 DIAGNOSIS — O99810 Abnormal glucose complicating pregnancy: Secondary | ICD-10-CM | POA: Insufficient documentation

## 2011-03-20 HISTORY — DX: Reserved for concepts with insufficient information to code with codable children: IMO0002

## 2011-03-20 HISTORY — DX: Unspecified ovarian cyst, unspecified side: N83.209

## 2011-03-20 HISTORY — DX: Urinary tract infection, site not specified: N39.0

## 2011-03-20 HISTORY — DX: Gestational diabetes mellitus in pregnancy, unspecified control: O24.419

## 2011-03-20 HISTORY — DX: Unspecified convulsions: R56.9

## 2011-03-20 HISTORY — DX: Headache: R51

## 2011-03-20 LAB — URINALYSIS, ROUTINE W REFLEX MICROSCOPIC
Leukocytes, UA: NEGATIVE
Specific Gravity, Urine: <1.005 — ABNORMAL LOW (ref 1.005–1.030)
Urobilinogen, UA: 0.2 mg/dL (ref 0.0–1.0)

## 2011-03-20 LAB — URINE MICROSCOPIC-ADD ON

## 2011-03-20 LAB — WET PREP, GENITAL: Clue Cells Wet Prep HPF POC: NONE SEEN

## 2011-03-20 LAB — GC/CHLAMYDIA PROBE AMP, GENITAL
Chlamydia, DNA Probe: NEGATIVE
GC Probe Amp, Genital: NEGATIVE

## 2011-03-20 NOTE — Discharge Instructions (Signed)
Preterm Labor, Home Care      Preterm labor is when a pregnant woman has contractions that cause the cervix to open, shorten and thin before 37 weeks of pregnancy. You will have regular contractions (tightening) 2 to 3 minutes apart. This usually causes discomfort or pain.     HOME CARE      Eat a healthy diet.   Take your vitamins as told by your doctor.   Drink 6 to 8 glasses of water a day.   Get rest and sleep.   Do not have sex if you have or are at high risk for preterm labor.    Follow your doctor's advice about activity, medicines and tests.   Avoid stress.   Avoid hard labor or exercise that lasts for a long time.   Do not smoke.     GET HELP IF:      You are having contractions.   You have belly (abdominal) pain.   You have bleeding from your vagina.   You have pain when you pee (urinate).   You have abnormal discharge from your vagina.   You develop a fever over 102 F (38.9 C) or higher.           Document Released: 11/15/2008    [employer] Patient Information 2011 [employer].

## 2011-03-20 NOTE — ED Notes (Signed)
Wkly 17 P injections in office due to hx of preterm delivery.  Advised to stop sex, no stimulation.  Had neg fFN in office 07/17

## 2011-03-20 NOTE — Progress Notes (Signed)
In office yesterday, was started on Procardia.  Has taken it, but is still contracting

## 2011-03-20 NOTE — ED Provider Notes (Addendum)
History    25YO [id number]  At [redacted]w[redacted]d who presents today with contractions.  Pt reports she these are not too strong, but began on Monday. Contractions sometimes occur q15 minutes, sometimes less frequently than that.  She has a hx of preterm labor, is on 17p with last injection 1 day ago, had contraction complaints yesterday, was evaluated at [facility], had a negative FFN and wet prep, cervix was closed, and was given procardia. States she hasn't noticed much difference in contractions with procardia. No vaginal bleeding or big gush of fluid per pt.  Denies HA, vision changes.  Endorses good BG control for her GDM with diet  Pt reports drinking roughly 32oz of water daily.  Chief Complaint  Patient presents with  . Contractions   The history is provided by the patient.    OB History    Grav Para Term Preterm Abortions TAB SAB Ect Mult Living   3 1 0 1 1 1 0 0 0 1       Past Medical History  Diagnosis Date  . Preterm labor   . Headache   . Seizures   . Gestational diabetes   . Urinary tract infection   . Ovarian cyst   . Abnormal Pap smear     Past Surgical History  Procedure Date  . Therapeutic abortion   . Repair of fr left foot     No family history on file.  History  Substance Use Topics  . Smoking status: Never Smoker   . Smokeless tobacco: Not on file  . Alcohol Use: No    Allergies: No Known Allergies  No prescriptions prior to admission    Review of Systems  Constitutional: Negative.   HENT: Negative.   Eyes: Negative.   Respiratory: Negative.   Cardiovascular: Negative.   Gastrointestinal: Negative.   Genitourinary: Negative.   Musculoskeletal: Negative.   Skin: Negative.   Neurological: Negative.   Endo/Heme/Allergies: Negative.   Psychiatric/Behavioral: Negative.    Physical Exam   Blood pressure 121/51, pulse 88, temperature 97.3 F (36.3 C), temperature source Oral, resp. rate 20, height 5\' 8"  (1.727 m), weight 189 lb 6.4 oz (85.911  kg).  Physical Exam  Constitutional: She appears well-developed and well-nourished.  HENT:  Head: Normocephalic.  Eyes: Pupils are equal, round, and reactive to light.  Cardiovascular: Normal rate and regular rhythm.  Exam reveals no gallop and no friction rub.   No murmur heard. Respiratory: Effort normal and breath sounds normal. No respiratory distress. She has no wheezes. She has no rales.  GI: Soft. She exhibits no distension. There is no tenderness. There is no rebound.  Musculoskeletal: She exhibits no edema.  SVE: Closed/thick/high/posterior EFM: 130s, mod variability, + accels, no decels TOCO: rare UC  MAU Course  Procedures  Results for orders placed during the hospital encounter of 03/20/11 (from the past 24 hour(s))  URINALYSIS, ROUTINE W REFLEX MICROSCOPIC     Status: Abnormal   Collection Time   03/20/11  4:35 PM      Component Value Range   Color, Urine YELLOW  YELLOW    Appearance CLEAR  CLEAR    Specific Gravity, Urine <1.005 (*) 1.005 - 1.030    pH 6.5  5.0 - 8.0    Glucose, UA NEGATIVE  NEGATIVE (mg/dL)   Hgb urine dipstick TRACE (*) NEGATIVE    Bilirubin Urine NEGATIVE  NEGATIVE    Ketones, ur NEGATIVE  NEGATIVE (mg/dL)   Protein, ur NEGATIVE  NEGATIVE (mg/dL)  Urobilinogen, UA 0.2  0.0 - 1.0 (mg/dL)   Nitrite NEGATIVE  NEGATIVE    Leukocytes, UA NEGATIVE  NEGATIVE   URINE MICROSCOPIC-ADD ON     Status: Abnormal   Collection Time   03/20/11  4:35 PM      Component Value Range   Squamous Epithelial / LPF FEW (*) RARE    RBC / HPF 0-2  <3 (RBC/hpf)     Assessment and Plan  Discussed with pt how intercourse can cause contractions.  Also discussed importance of adequate hydration, particularly in hot summer months. Will check a urine for UTI, as well as check cervix to rule out preterm labor and monitor patient and baby.  Lurlean Leyden 03/20/2011, 4:16 PM   A/P: 25 you [id number] @ 29.[redacted] wks EGA with threatened preterm labor. Rev'd warning signs and  precautions. Follow up as scheduled or sooner if needed.

## 2011-03-26 ENCOUNTER — Encounter (INDEPENDENT_AMBULATORY_CARE_PROVIDER_SITE_OTHER): Payer: Private Health Insurance - Indemnity | Admitting: Obstetrics & Gynecology

## 2011-03-26 DIAGNOSIS — O09219 Supervision of pregnancy with history of pre-term labor, unspecified trimester: Secondary | ICD-10-CM

## 2011-04-03 ENCOUNTER — Ambulatory Visit: Payer: Private Health Insurance - Indemnity

## 2011-04-04 ENCOUNTER — Ambulatory Visit: Payer: Private Health Insurance - Indemnity | Admitting: Obstetrics & Gynecology

## 2011-04-11 ENCOUNTER — Encounter (INDEPENDENT_AMBULATORY_CARE_PROVIDER_SITE_OTHER): Payer: Private Health Insurance - Indemnity | Admitting: Obstetrics & Gynecology

## 2011-04-11 DIAGNOSIS — O09219 Supervision of pregnancy with history of pre-term labor, unspecified trimester: Secondary | ICD-10-CM

## 2011-04-11 DIAGNOSIS — G4700 Insomnia, unspecified: Secondary | ICD-10-CM

## 2011-04-11 MED ORDER — ZOLPIDEM TARTRATE 10 MG PO TABS: 10.0000 mg | ORAL_TABLET | Freq: Every evening | ORAL | Status: DC | PRN

## 2011-04-12 NOTE — Progress Notes (Signed)
This encounter was created in error - please disregard.

## 2011-04-17 ENCOUNTER — Ambulatory Visit (INDEPENDENT_AMBULATORY_CARE_PROVIDER_SITE_OTHER): Payer: Private Health Insurance - Indemnity

## 2011-04-17 DIAGNOSIS — O09219 Supervision of pregnancy with history of pre-term labor, unspecified trimester: Secondary | ICD-10-CM

## 2011-04-20 ENCOUNTER — Inpatient Hospital Stay (HOSPITAL_COMMUNITY)
Admission: AD | Admit: 2011-04-20 | Discharge: 2011-04-23 | DRG: 767 | Disposition: A | Discharge status: Home / Self Care | Payer: Private Health Insurance - Indemnity | Source: Ambulatory Visit | Attending: Obstetrics & Gynecology | Admitting: Obstetrics & Gynecology

## 2011-04-20 ENCOUNTER — Encounter (HOSPITAL_COMMUNITY): Payer: Self-pay | Admitting: *Deleted

## 2011-04-20 DIAGNOSIS — O4290 Premature rupture of membranes, unspecified as to length of time between rupture and onset of labor, unspecified weeks of gestation: Secondary | ICD-10-CM | POA: Diagnosis present

## 2011-04-20 DIAGNOSIS — O42913 Preterm premature rupture of membranes, unspecified as to length of time between rupture and onset of labor, third trimester: Secondary | ICD-10-CM

## 2011-04-20 DIAGNOSIS — Z302 Encounter for sterilization: Secondary | ICD-10-CM

## 2011-04-20 DIAGNOSIS — R21 Rash and other nonspecific skin eruption: Secondary | ICD-10-CM

## 2011-04-20 DIAGNOSIS — O99814 Abnormal glucose complicating childbirth: Principal | ICD-10-CM | POA: Diagnosis present

## 2011-04-20 DIAGNOSIS — O09219 Supervision of pregnancy with history of pre-term labor, unspecified trimester: Secondary | ICD-10-CM

## 2011-04-20 LAB — CBC
HCT: 34.4 % — ABNORMAL LOW (ref 36.0–46.0)
Hemoglobin: 12.1 g/dL (ref 12.0–15.0)
MCH: 31.8 pg (ref 26.0–34.0)
MCV: 90.5 fL (ref 78.0–100.0)
RBC: 3.8 MIL/uL — ABNORMAL LOW (ref 3.87–5.11)
WBC: 7.1 10*3/uL (ref 4.0–10.5)

## 2011-04-20 MED ORDER — CITRIC ACID-SODIUM CITRATE 334-500 MG/5ML PO SOLN: 30.0000 mL | ORAL | Status: DC | PRN

## 2011-04-20 MED ORDER — PENICILLIN G POTASSIUM 5000000 UNITS IJ SOLR
2.5000 10*6.[IU] | INTRAVENOUS | Status: DC
  Administered 2011-04-20 – 2011-04-21 (×5): 2.5 10*6.[IU] via INTRAVENOUS
  Filled 2011-04-20 (×7): qty 2.5

## 2011-04-20 MED ORDER — FLEET ENEMA 7-19 GM/118ML RE ENEM: 1.0000 | ENEMA | RECTAL | Status: DC | PRN

## 2011-04-20 MED ORDER — ACETAMINOPHEN 325 MG PO TABS: 650.0000 mg | ORAL_TABLET | ORAL | Status: DC | PRN

## 2011-04-20 MED ORDER — CITRIC ACID-SODIUM CITRATE 334-500 MG/5ML PO SOLN
30.0000 mL | ORAL | Status: DC | PRN
  Administered 2011-04-21: 30 mL via ORAL
  Filled 2011-04-20: qty 15

## 2011-04-20 MED ORDER — OXYCODONE-ACETAMINOPHEN 5-325 MG PO TABS: 2.0000 | ORAL_TABLET | ORAL | Status: DC | PRN

## 2011-04-20 MED ORDER — LIDOCAINE HCL (PF) 1 % IJ SOLN: 30.0000 mL | INTRAMUSCULAR | Status: DC | PRN

## 2011-04-20 MED ORDER — OXYTOCIN 20 UNITS IN LACTATED RINGERS INFUSION - SIMPLE: 125.0000 mL/h | INTRAVENOUS | Status: AC

## 2011-04-20 MED ORDER — LACTATED RINGERS IV SOLN: 500.0000 mL | INTRAVENOUS | Status: DC | PRN

## 2011-04-20 MED ORDER — LIDOCAINE HCL (PF) 1 % IJ SOLN
30.0000 mL | INTRAMUSCULAR | Status: DC | PRN
  Filled 2011-04-20: qty 30

## 2011-04-20 MED ORDER — OXYTOCIN BOLUS FROM INFUSION
500.0000 mL | Freq: Once | INTRAVENOUS | Status: DC
  Filled 2011-04-20: qty 500

## 2011-04-20 MED ORDER — LACTATED RINGERS IV SOLN
INTRAVENOUS | Status: DC
  Administered 2011-04-20: 14:00:00 via INTRAVENOUS

## 2011-04-20 MED ORDER — ONDANSETRON HCL 4 MG/2ML IJ SOLN: 4.0000 mg | Freq: Four times a day (QID) | INTRAMUSCULAR | Status: DC | PRN

## 2011-04-20 MED ORDER — MISOPROSTOL 25 MCG QUARTER TABLET
25.0000 ug | ORAL_TABLET | ORAL | Status: DC | PRN
  Administered 2011-04-20 (×2): 25 ug via VAGINAL
  Filled 2011-04-20 (×2): qty 0.25
  Filled 2011-04-20: qty 1
  Filled 2011-04-20 (×2): qty 0.25

## 2011-04-20 MED ORDER — PENICILLIN G POTASSIUM 5000000 UNITS IJ SOLR
5.0000 10*6.[IU] | Freq: Once | INTRAVENOUS | Status: DC
  Administered 2011-04-20: 5 10*6.[IU] via INTRAVENOUS
  Filled 2011-04-20: qty 5

## 2011-04-20 MED ORDER — IBUPROFEN 600 MG PO TABS: 600.0000 mg | ORAL_TABLET | Freq: Four times a day (QID) | ORAL | Status: DC | PRN

## 2011-04-20 MED ORDER — NALBUPHINE SYRINGE 5 MG/0.5 ML
10.0000 mg | INJECTION | INTRAMUSCULAR | Status: DC | PRN
  Administered 2011-04-20 – 2011-04-21 (×5): 10 mg via INTRAVENOUS
  Filled 2011-04-20 (×5): qty 1

## 2011-04-20 MED ORDER — TERBUTALINE SULFATE 1 MG/ML IJ SOLN: 0.2500 mg | Freq: Once | INTRAMUSCULAR | Status: AC | PRN

## 2011-04-20 MED ORDER — PENICILLIN G POTASSIUM 5000000 UNITS IJ SOLR
5.0000 10*6.[IU] | Freq: Once | INTRAVENOUS | Status: AC
  Filled 2011-04-20: qty 5

## 2011-04-20 MED ORDER — PENICILLIN G POTASSIUM 5000000 UNITS IJ SOLR
2.5000 10*6.[IU] | INTRAVENOUS | Status: DC
  Administered 2011-04-20: 2.5 10*6.[IU] via INTRAVENOUS
  Filled 2011-04-20 (×3): qty 2.5

## 2011-04-20 MED ORDER — OXYTOCIN 20 UNITS IN LACTATED RINGERS INFUSION - SIMPLE: 125.0000 mL/h | INTRAVENOUS | Status: DC

## 2011-04-20 MED ORDER — NALBUPHINE SYRINGE 5 MG/0.5 ML
INJECTION | INTRAMUSCULAR | Status: AC
  Administered 2011-04-20: 10 mg via INTRAVENOUS
  Filled 2011-04-20: qty 1

## 2011-04-20 MED ORDER — LACTATED RINGERS IV SOLN
INTRAVENOUS | Status: DC
  Administered 2011-04-20 – 2011-04-21 (×4): via INTRAVENOUS

## 2011-04-20 NOTE — Progress Notes (Signed)
[name] is a 25 y.o. [phone] at [redacted]w[redacted]d by LMP admitted for augmentation of labor due to Spontaneous rupture of BOW at 34.1 weeks.  Subjective: Pt doing well.  Walking in hallway and intermittent monitoring helping with pain relief.  Objective: BP 102/57   Pulse 81   Temp(Src) 98 F (36.7 C) (Oral)   Resp 18   Ht 5\' 8"  (1.727 m)   Wt 85.73 kg (189 lb)   BMI 28.74 kg/m2   LMP 08/10/2010      FHT:  FHR: 135 bpm, variability: moderate,  accelerations:  Present,  decelerations:  Absent UC:   irregular, every 2-8 minutes SVE:   Dilation: Closed Effacement (%): Thick Station: -3 Exam by:: Leftwich-Kirby, SNM  Labs: Lab Results  Component Value Date   WBC 7.1 04/20/2011   HGB 12.1 04/20/2011   HCT 34.4* 04/20/2011   MCV 90.5 04/20/2011   PLT 196 04/20/2011    Assessment / Plan: Augmentation of labor, progressing well Continue Cytotec  Labor: Progressing normally Preeclampsia:  n/a Fetal Wellbeing:  Category I Pain Control:  Nubain I/D:  n/a Anticipated MOD:  NSVD  Sharen Counter 04/20/2011, 11:32 PM

## 2011-04-20 NOTE — H&P (Signed)
[name] is a 25 y.o. female presenting for LOF and contractions. Maternal Medical History:  Reason for admission: Reason for admission: rupture of membranes and contractions.  Contractions: Onset was 3-5 hours ago.   Frequency: regular.   Duration is approximately 60 seconds.   Perceived severity is moderate.    Fetal activity: Perceived fetal activity is normal.   Last perceived fetal movement was within the past hour.    Prenatal complications: Preterm labor.   Prenatal Complications - Diabetes: gestational. Diabetes is managed by diet.      OB History    Grav Para Term Preterm Abortions TAB SAB Ect Mult Living   3 1 0 1 1 1 0 0 0 1      Past Medical History  Diagnosis Date   Preterm labor    Headache    Seizures    Gestational diabetes    Urinary tract infection    Ovarian cyst    Abnormal Pap smear    Past Surgical History  Procedure Date   Therapeutic abortion    Repair of fr left foot    Family History: family history includes Cancer in her paternal grandfather; Hyperthyroidism in her father; Hypothyroidism in her mother; and Multiple sclerosis in her paternal aunt. Social History:  reports that she has never smoked. She has never used smokeless tobacco. She reports that she does not drink alcohol or use illicit drugs.  ROS Dilation: Closed Effacement (%): 90 Exam by:: L.Kirby,SNM Blood pressure 112/73, pulse 113, temperature 98 F (36.7 C), temperature source Oral, resp. rate 18, last menstrual period 08/10/2010.  Exam Physical Exam  Prenatal labs: ABO, Rh: --/--/A POS (04/23 1610) Antibody: NEG (02/17 2232) Rubella:   RPR: NON REAC (02/17 2232)  HBsAg: NEGATIVE (02/17 2232)  HIV: NON REACTIVE (02/17 2232)  GBS:     Assessment/Plan: Admit to L&D GBS prophylaxis for GBS unknown  Sharen Counter 04/20/2011, 1:10 PM

## 2011-04-20 NOTE — Progress Notes (Signed)
updated on pt status, orders received for intermittent monitoring and pt may walk with reactive FHR tracing

## 2011-04-20 NOTE — Progress Notes (Signed)
[name] is a 25 y.o. [phone] at [redacted]w[redacted]d by ultrasound admitted for augmentation of labor due to Spontaneous rupture of BOW at 34.1 weeks.  Subjective: Pt pain well controlled with Nubain.  Husband at bedside for support.  Objective: BP 102/48   Pulse 81   Temp(Src) 98.7 F (37.1 C) (Oral)   Resp 18   Ht 5\' 8"  (1.727 m)   Wt 85.73 kg (189 lb)   BMI 28.74 kg/m2   LMP 08/10/2010      FHT:  FHR: 135 bpm, variability: moderate,  accelerations:  Present,  decelerations:  Absent UC:   irregular, every 4-10 minutes SVE:   Dilation: Closed Effacement (%): Thick Station: -3 Exam by:: j.cox,rn Cervix unchanged at this time Cytotec dose placed  Labs: Lab Results  Component Value Date   WBC 7.1 04/20/2011   HGB 12.1 04/20/2011   HCT 34.4* 04/20/2011   MCV 90.5 04/20/2011   PLT 196 04/20/2011    Assessment / Plan: Augmentation of labor, progressing well Continue GBS prophylaxis Continue Cytotec Q4 hours  Labor: Progressing normally Preeclampsia:  N/A Fetal Wellbeing:  Category I Pain Control:  Nubain I/D:  n/a Anticipated MOD:  NSVD  Sharen Counter 04/20/2011, 7:48 PM

## 2011-04-20 NOTE — Progress Notes (Signed)
Pt reports having fluid leaking out since she got up this morning. Reports ctx on and off as well.

## 2011-04-21 ENCOUNTER — Encounter (HOSPITAL_COMMUNITY): Payer: Self-pay | Admitting: *Deleted

## 2011-04-21 ENCOUNTER — Encounter (HOSPITAL_COMMUNITY): Payer: Self-pay | Admitting: Anesthesiology

## 2011-04-21 ENCOUNTER — Encounter (HOSPITAL_COMMUNITY)
Admission: AD | Disposition: A | Discharge status: Home / Self Care | Payer: Self-pay | Source: Ambulatory Visit | Attending: Obstetrics & Gynecology

## 2011-04-21 ENCOUNTER — Other Ambulatory Visit (HOSPITAL_COMMUNITY): Payer: Self-pay | Admitting: Obstetrics and Gynecology

## 2011-04-21 ENCOUNTER — Inpatient Hospital Stay (HOSPITAL_COMMUNITY): Payer: Private Health Insurance - Indemnity | Admitting: Anesthesiology

## 2011-04-21 DIAGNOSIS — O4290 Premature rupture of membranes, unspecified as to length of time between rupture and onset of labor, unspecified weeks of gestation: Secondary | ICD-10-CM

## 2011-04-21 DIAGNOSIS — O99814 Abnormal glucose complicating childbirth: Secondary | ICD-10-CM

## 2011-04-21 DIAGNOSIS — O0940 Supervision of pregnancy with grand multiparity, unspecified trimester: Secondary | ICD-10-CM

## 2011-04-21 HISTORY — PX: TUBAL LIGATION: SHX77

## 2011-04-21 LAB — GLUCOSE, CAPILLARY

## 2011-04-21 SURGERY — LIGATION, FALLOPIAN TUBE, POSTPARTUM
Anesthesia: Epidural | Site: Abdomen | Laterality: Bilateral | Wound class: Clean

## 2011-04-21 MED ORDER — DIPHENHYDRAMINE HCL 50 MG/ML IJ SOLN: 12.5000 mg | INTRAMUSCULAR | Status: DC | PRN

## 2011-04-21 MED ORDER — MEPERIDINE HCL 25 MG/ML IJ SOLN
INTRAMUSCULAR | Status: DC | PRN
  Administered 2011-04-21 (×2): 12.5 mg via INTRAVENOUS

## 2011-04-21 MED ORDER — EPHEDRINE 5 MG/ML INJ
10.0000 mg | INTRAVENOUS | Status: DC | PRN
  Administered 2011-04-21: 20 mg via INTRAVENOUS
  Filled 2011-04-21 (×2): qty 4

## 2011-04-21 MED ORDER — TETANUS-DIPHTH-ACELL PERTUSSIS 5-2.5-18.5 LF-MCG/0.5 IM SUSP
0.5000 mL | Freq: Once | INTRAMUSCULAR | Status: AC
  Administered 2011-04-22: 0.5 mL via INTRAMUSCULAR
  Filled 2011-04-21: qty 0.5

## 2011-04-21 MED ORDER — ONDANSETRON HCL 4 MG PO TABS: 4.0000 mg | ORAL_TABLET | ORAL | Status: DC | PRN

## 2011-04-21 MED ORDER — PHENYLEPHRINE HCL 10 MG/ML IJ SOLN
INTRAMUSCULAR | Status: DC | PRN
  Administered 2011-04-21 (×2): 80 ug via INTRAVENOUS
  Administered 2011-04-21: 120 ug via INTRAVENOUS

## 2011-04-21 MED ORDER — HYDROMORPHONE HCL 1 MG/ML IJ SOLN: 0.2500 mg | INTRAMUSCULAR | Status: DC | PRN

## 2011-04-21 MED ORDER — PHENYLEPHRINE 40 MCG/ML (10ML) SYRINGE FOR IV PUSH (FOR BLOOD PRESSURE SUPPORT)
80.0000 ug | PREFILLED_SYRINGE | INTRAVENOUS | Status: DC | PRN
  Filled 2011-04-21 (×2): qty 5

## 2011-04-21 MED ORDER — MORPHINE SULFATE 10 MG/ML IJ SOLN
INTRAMUSCULAR | Status: DC | PRN
  Administered 2011-04-21 (×5): 2 mg via INTRAVENOUS

## 2011-04-21 MED ORDER — OXYCODONE-ACETAMINOPHEN 5-325 MG PO TABS
1.0000 | ORAL_TABLET | ORAL | Status: DC | PRN
  Administered 2011-04-22 (×3): 2 via ORAL
  Filled 2011-04-21 (×3): qty 2

## 2011-04-21 MED ORDER — FENTANYL 2.5 MCG/ML BUPIVACAINE 1/10 % EPIDURAL INFUSION (WH - ANES)
14.0000 mL/h | INTRAMUSCULAR | Status: DC
  Administered 2011-04-21: 14 mL/h via EPIDURAL
  Filled 2011-04-21 (×2): qty 60

## 2011-04-21 MED ORDER — IBUPROFEN 600 MG PO TABS
600.0000 mg | ORAL_TABLET | Freq: Four times a day (QID) | ORAL | Status: DC
  Administered 2011-04-21 – 2011-04-23 (×6): 600 mg via ORAL
  Filled 2011-04-21 (×6): qty 1

## 2011-04-21 MED ORDER — PRENATAL PLUS 27-1 MG PO TABS
1.0000 | ORAL_TABLET | Freq: Every day | ORAL | Status: DC
  Administered 2011-04-22 – 2011-04-23 (×2): 1 via ORAL
  Filled 2011-04-21 (×2): qty 1

## 2011-04-21 MED ORDER — MIDAZOLAM HCL 2 MG/2ML IJ SOLN
INTRAMUSCULAR | Status: AC
  Filled 2011-04-21: qty 2

## 2011-04-21 MED ORDER — LIDOCAINE HCL 1.5 % IJ SOLN
INTRAMUSCULAR | Status: DC | PRN
  Administered 2011-04-21 (×2): 5 mL via EPIDURAL

## 2011-04-21 MED ORDER — MEPERIDINE HCL 25 MG/ML IJ SOLN
6.2500 mg | INTRAMUSCULAR | Status: DC | PRN
  Administered 2011-04-21: 12.5 mg via INTRAVENOUS

## 2011-04-21 MED ORDER — SODIUM BICARBONATE 8.4 % IV SOLN
INTRAVENOUS | Status: DC | PRN
  Administered 2011-04-21: 5 mL via EPIDURAL

## 2011-04-21 MED ORDER — PHENYLEPHRINE 40 MCG/ML (10ML) SYRINGE FOR IV PUSH (FOR BLOOD PRESSURE SUPPORT)
PREFILLED_SYRINGE | INTRAVENOUS | Status: AC
  Filled 2011-04-21: qty 5

## 2011-04-21 MED ORDER — LACTATED RINGERS IV SOLN: 500.0000 mL | Freq: Once | INTRAVENOUS | Status: DC

## 2011-04-21 MED ORDER — ONDANSETRON HCL 4 MG/2ML IJ SOLN
INTRAMUSCULAR | Status: AC
  Filled 2011-04-21: qty 2

## 2011-04-21 MED ORDER — TERBUTALINE SULFATE 1 MG/ML IJ SOLN: 0.2500 mg | Freq: Once | INTRAMUSCULAR | Status: DC | PRN

## 2011-04-21 MED ORDER — DEXAMETHASONE SODIUM PHOSPHATE 4 MG/ML IJ SOLN: 8.0000 mg | Freq: Once | INTRAMUSCULAR | Status: DC | PRN

## 2011-04-21 MED ORDER — PHENYLEPHRINE 40 MCG/ML (10ML) SYRINGE FOR IV PUSH (FOR BLOOD PRESSURE SUPPORT)
80.0000 ug | PREFILLED_SYRINGE | INTRAVENOUS | Status: DC | PRN
  Filled 2011-04-21: qty 5

## 2011-04-21 MED ORDER — ONDANSETRON HCL 4 MG/2ML IJ SOLN
INTRAMUSCULAR | Status: DC | PRN
  Administered 2011-04-21: 4 mg via INTRAVENOUS

## 2011-04-21 MED ORDER — MIDAZOLAM HCL 5 MG/5ML IJ SOLN
INTRAMUSCULAR | Status: DC | PRN
  Administered 2011-04-21 (×2): 2 mg via INTRAVENOUS

## 2011-04-21 MED ORDER — SODIUM CHLORIDE 0.9 % IR SOLN
Status: DC | PRN
  Administered 2011-04-21: 1000 mL

## 2011-04-21 MED ORDER — DIPHENHYDRAMINE HCL 25 MG PO CAPS
25.0000 mg | ORAL_CAPSULE | Freq: Four times a day (QID) | ORAL | Status: DC | PRN
  Administered 2011-04-22 – 2011-04-23 (×3): 25 mg via ORAL
  Filled 2011-04-21 (×4): qty 1

## 2011-04-21 MED ORDER — OXYTOCIN 20 UNITS IN LACTATED RINGERS INFUSION - SIMPLE
1.0000 m[IU]/min | INTRAVENOUS | Status: DC
  Administered 2011-04-21: 12 m[IU]/min via INTRAVENOUS
  Administered 2011-04-21: 8 m[IU]/min via INTRAVENOUS
  Administered 2011-04-21: 2 m[IU]/min via INTRAVENOUS
  Filled 2011-04-21: qty 1000

## 2011-04-21 MED ORDER — LIDOCAINE HCL (CARDIAC) 20 MG/ML IV SOLN
INTRAVENOUS | Status: AC
Start: 2011-04-21 — End: 2011-04-21
  Filled 2011-04-21: qty 5

## 2011-04-21 MED ORDER — MEPERIDINE HCL 25 MG/ML IJ SOLN
INTRAMUSCULAR | Status: AC
  Filled 2011-04-21: qty 1

## 2011-04-21 MED ORDER — BUPIVACAINE HCL (PF) 0.25 % IJ SOLN
INTRAMUSCULAR | Status: DC | PRN
  Administered 2011-04-21: 10 mL

## 2011-04-21 MED ORDER — ONDANSETRON HCL 4 MG/2ML IJ SOLN: 4.0000 mg | INTRAMUSCULAR | Status: DC | PRN

## 2011-04-21 MED ORDER — MORPHINE SULFATE 10 MG/ML IJ SOLN
INTRAMUSCULAR | Status: AC
  Filled 2011-04-21: qty 1

## 2011-04-21 MED ORDER — ZOLPIDEM TARTRATE 5 MG PO TABS: 5.0000 mg | ORAL_TABLET | Freq: Every evening | ORAL | Status: DC | PRN

## 2011-04-21 MED ORDER — PROPOFOL 10 MG/ML IV EMUL
INTRAVENOUS | Status: DC | PRN
  Administered 2011-04-21 (×5): 10 mg via INTRAVENOUS

## 2011-04-21 MED ORDER — WITCH HAZEL-GLYCERIN EX PADS: 1.0000 "application " | MEDICATED_PAD | CUTANEOUS | Status: DC | PRN

## 2011-04-21 MED ORDER — FENTANYL 2.5 MCG/ML BUPIVACAINE 1/10 % EPIDURAL INFUSION (WH - ANES)
INTRAMUSCULAR | Status: DC | PRN
  Administered 2011-04-21: 14 mL/h via EPIDURAL

## 2011-04-21 MED ORDER — LANOLIN HYDROUS EX OINT: TOPICAL_OINTMENT | CUTANEOUS | Status: DC | PRN

## 2011-04-21 MED ORDER — KETOROLAC TROMETHAMINE 30 MG/ML IJ SOLN: 15.0000 mg | Freq: Once | INTRAMUSCULAR | Status: DC | PRN

## 2011-04-21 MED ORDER — SENNOSIDES-DOCUSATE SODIUM 8.6-50 MG PO TABS
2.0000 | ORAL_TABLET | Freq: Every day | ORAL | Status: DC
  Administered 2011-04-21: 2 via ORAL

## 2011-04-21 MED ORDER — DIBUCAINE 1 % RE OINT: 1.0000 "application " | TOPICAL_OINTMENT | RECTAL | Status: DC | PRN

## 2011-04-21 MED ORDER — SIMETHICONE 80 MG PO CHEW: 80.0000 mg | CHEWABLE_TABLET | ORAL | Status: DC | PRN

## 2011-04-21 MED ORDER — BENZOCAINE-MENTHOL 20-0.5 % EX AERO: 1.0000 "application " | INHALATION_SPRAY | CUTANEOUS | Status: DC | PRN

## 2011-04-21 MED ORDER — EPHEDRINE 5 MG/ML INJ
10.0000 mg | INTRAVENOUS | Status: DC | PRN
  Filled 2011-04-21: qty 4

## 2011-04-21 SURGICAL SUPPLY — 26 items
BENZOIN TINCTURE PRP APPL 2/3 (GAUZE/BANDAGES/DRESSINGS) IMPLANT
CHLORAPREP W/TINT 26ML (MISCELLANEOUS) ×2 IMPLANT
CLIP FILSHIE TUBAL LIGA STRL (Clip) ×2 IMPLANT
CLOTH BEACON ORANGE TIMEOUT ST (SAFETY) ×2 IMPLANT
DRSG COVADERM PLUS 2X2 (GAUZE/BANDAGES/DRESSINGS) ×2 IMPLANT
ELECT REM PT RETURN 9FT ADLT (ELECTROSURGICAL) ×2
ELECTRODE REM PT RTRN 9FT ADLT (ELECTROSURGICAL) ×1 IMPLANT
GLOVE BIO SURGEON STRL SZ7 (GLOVE) ×2 IMPLANT
GLOVE BIOGEL PI IND STRL 7.0 (GLOVE) ×2 IMPLANT
GLOVE BIOGEL PI INDICATOR 7.0 (GLOVE) ×2
GOWN PREVENTION PLUS LG XLONG (DISPOSABLE) ×2 IMPLANT
GOWN STRL REIN XL XLG (GOWN DISPOSABLE) ×2 IMPLANT
NEEDLE HYPO 25X1 1.5 SAFETY (NEEDLE) ×2 IMPLANT
NS IRRIG 1000ML POUR BTL (IV SOLUTION) ×2 IMPLANT
PACK ABDOMINAL MINOR (CUSTOM PROCEDURE TRAY) ×2 IMPLANT
PENCIL BUTTON HOLSTER BLD 10FT (ELECTRODE) ×2 IMPLANT
SPONGE LAP 4X18 X RAY DECT (DISPOSABLE) ×2 IMPLANT
STRIP CLOSURE SKIN 1/2X4 (GAUZE/BANDAGES/DRESSINGS) IMPLANT
SUT CHROMIC 2 0 TIES 18 (SUTURE) IMPLANT
SUT VIC AB 0 CT1 27 (SUTURE) ×1
SUT VIC AB 0 CT1 27XBRD ANBCTR (SUTURE) ×1 IMPLANT
SUT VICRYL 4-0 PS2 18IN ABS (SUTURE) ×2 IMPLANT
SYR CONTROL 10ML LL (SYRINGE) ×2 IMPLANT
TOWEL OR 17X24 6PK STRL BLUE (TOWEL DISPOSABLE) ×4 IMPLANT
TRAY FOLEY CATH 14FR (SET/KITS/TRAYS/PACK) ×2 IMPLANT
WATER STERILE IRR 1000ML POUR (IV SOLUTION) IMPLANT

## 2011-04-21 NOTE — Progress Notes (Signed)
  [name] is a 25 y.o. [phone] at [redacted]w[redacted]d admitted for induction of labor due to PPROM.  Subjective: Pt had epidural placed; comfortable, wants to rest  Objective: BP 115/51  Pulse 107  Temp(Src) 97.7 F (36.5 C) (Oral)  Resp 22  Ht 5\' 8"  (1.727 m)  Wt 189 lb (85.73 kg)  BMI 28.74 kg/m2  SpO2 100%  LMP 08/10/2010      FHT:  FHR: 145 bpm, variability: moderate,  accelerations:  Present,  decelerations:  Absent UC:   regular, every 3 minutes SVE:  deferred Labs: Lab Results  Component Value Date   WBC 7.1 04/20/2011   HGB 12.1 04/20/2011   HCT 34.4* 04/20/2011   MCV 90.5 04/20/2011   PLT 196 04/20/2011    Assessment / Plan: Induction of labor due to PROM,  progressing well on pitocin  Labor: progressing on pitocin Fetal Wellbeing:  Category I Pain Control:  Epidural I/D:  PCN G for unknown GBS status Anticipated MOD:  NSVD  BOOTH, Annabel Gibeau 04/21/2011, 12:11 PM

## 2011-04-21 NOTE — Anesthesia Preprocedure Evaluation (Addendum)
Anesthesia Evaluation  Name, MR# and DOB Patient awake  General Assessment Comment  Reviewed: Allergy & Precautions, H&P , NPO status , Patient's Chart, lab work & pertinent test results  Airway Mallampati: II TM Distance: >3 FB Neck ROM: full    Dental No notable dental hx. (+) Teeth Intact   Pulmonary  clear to auscultation  pulmonary exam normalPulmonary Exam Normal breath sounds clear to auscultation none    Cardiovascular     Neuro/Psych Negative Neurological ROS  Negative Psych ROS  GI/Hepatic/Renal negative GI ROS, negative Liver ROS, and negative Renal ROS (+)       Endo/Other    Abdominal Normal abdominal exam  (+)   Musculoskeletal negative musculoskeletal ROS (+)   Hematology negative hematology ROS (+)   Peds  Reproductive/Obstetrics (+) Pregnancy    Anesthesia Other Findings             Anesthesia Physical Anesthesia Plan  ASA: II  Anesthesia Plan: Epidural   Post-op Pain Management:    Induction:   Airway Management Planned:   Additional Equipment:   Intra-op Plan:   Post-operative Plan:   Informed Consent: I have reviewed the patients History and Physical, chart, labs and discussed the procedure including the risks, benefits and alternatives for the proposed anesthesia with the patient or authorized representative who has indicated his/her understanding and acceptance.     Plan Discussed with:   Anesthesia Plan Comments:         Anesthesia Quick Evaluation

## 2011-04-21 NOTE — Progress Notes (Signed)
BARRY CULVERHOUSE is a 25 y.o. [phone] at [redacted]w[redacted]d by ultrasound admitted for rupture of membranes  Subjective:   Objective: BP 103/53  Pulse 76  Temp(Src) 98.1 F (36.7 C) (Oral)  Resp 20  Ht 5\' 8"  (1.727 m)  Wt 85.73 kg (189 lb)  BMI 28.74 kg/m2  SpO2 100%  LMP 08/10/2010      FHT:  FHR: 145 bpm, variability: moderate,  accelerations:  Present,  decelerations:  Absent UC:    SVE:   Dilation: 5 Effacement (%): 50 Station: -2 Exam by:: j.cox,rn  Labs: Lab Results  Component Value Date   WBC 7.1 04/20/2011   HGB 12.1 04/20/2011   HCT 34.4* 04/20/2011   MCV 90.5 04/20/2011   PLT 196 04/20/2011    Assessment / Plan: Induction of labor due to PROM,  progressing well on pitocin  Labor: Progressing on Pitocin, will continue to increase then AROM Preeclampsia:  no signs or symptoms of toxicity Fetal Wellbeing:  Category I Pain Control:  Epidural I/D:  n/a Anticipated MOD:  NSVD  [name] 04/21/2011, 3:15 PM

## 2011-04-21 NOTE — Progress Notes (Signed)
foley bulb placed by Leftwich-Kirby, SNM, filled with 60cc of LR, pt tolerated procedure well

## 2011-04-21 NOTE — Progress Notes (Signed)
Post Partum Day 0 Subjective: [name] is s/p vaginal delivery of Nichols 106 week female infant after PPROM.  She is doing well, no complaints.  Infant is in NICU secondary to prematurity.  She plans to breasfeed infant.  Patient desires permanent sterilization and is interested in proceeding with this procedure as soon as possible.  Objective: Blood pressure 99/42, pulse 76, temperature 98.1 F (36.7 C), temperature source Oral, resp. rate 18, height 5\' 8"  (1.727 m), weight 85.73 kg (189 lb), last menstrual period 08/10/2010, SpO2 100.00%.  Physical Exam:  General: alert and no distress Lochia: appropriate Uterine Fundus: firm DVT Evaluation: No evidence of DVT seen on physical exam. No significant calf/ankle edema.  Basename 04/20/11 1405  HGB 12.1  HCT 34.4*    Assessment/Plan: [name] is stable after vaginal delivery and desires permanent sterilization.  Other reversible forms of contraception were discussed with patient; she declines all other modalities. Risks and benefits of procedure discussed with patient including but not limited to: risk of regret, permanence of method, bleeding, infection, injury to surrounding organs and need for additional procedures, risk of regret.  Failure risk of 0.5-1% with increased risk of ectopic gestation if pregnancy occurs was also discussed with patient.  She has been NPO since yesterday.  Dr. Arby Barrette, Anesthesiologist on call, agrees with plan.  To OR when ready.  After procedure, patient will receive routine postpartum care.      LOS: 1 day   [name] 04/21/2011, 4:27 PM

## 2011-04-21 NOTE — Transfer of Care (Signed)
Immediate Anesthesia Transfer of Care Note  Patient: [name]  Procedure(s) Performed:  POST PARTUM TUBAL LIGATION - Bilateral post partum tubal ligation with filshie clips.  Patient Location: PACU  Anesthesia Type: Epidural  Level of Consciousness: awake, alert  and oriented  Airway & Oxygen Therapy: Patient Spontanous Breathing  Post-op Assessment: Report given to PACU RN  Post vital signs: stable  Complications: No apparent anesthesia complications

## 2011-04-21 NOTE — Op Note (Signed)
[name] 04/21/2011  PREOPERATIVE DIAGNOSIS:  Multiparity, undesired fertility  POSTOPERATIVE DIAGNOSIS:  Multiparity, undesired fertility  PROCEDURE:  Postpartum Bilateral Tubal Sterilization using Filshie Clips   ANESTHESIA:  Epidural and local analgesia using 0.25% Marcaine  COMPLICATIONS:  None immediate.  ESTIMATED BLOOD LOSS: 10 ml.  FLUIDS: 850 ml LR.  URINE OUTPUT:  250 ml of clear urine.  INDICATIONS: 25 y.o. [id number]  with undesired fertility,status post vaginal delivery, desires permanent sterilization.  Other reversible forms of contraception were discussed with patient; she declines all other modalities. Risks and benefits of procedure discussed with patient including but not limited to: risk of regret, permanence of method, bleeding, infection, injury to surrounding organs and need for additional procedures, risk of regret.  Failure risk of 0.5-1% with increased risk of ectopic gestation if pregnancy occurs was also discussed with patient.     FINDINGS:  Normal uterus, tubes, and ovaries.  PROCEDURE DETAILS: The patient was taken to the operating room where her epidural anesthesia was dosed up to surgical level and found to be adequate.  She was then placed in the dorsal supine position and prepped and draped in sterile fashion.  After an adequate timeout was performed, attention was turned to the patient's abdomen where a small transverse skin incision was made under the umbilical fold. The incision was taken down to the layer of fascia using the scalpel, and fascia was incised, and extended bilaterally using Mayo scissors. The peritoneum was entered in a sharp fashion. Attention was then turned to the patient's uterus, which was noted to be significantly below the umbilical level.  The left fallopian tube was identified and followed out to the fimbriated end.  A Filshie clip was placed on the left fallopian tube about 3 cm from the cornual attachment, with care given to  incorporate the underlying mesosalpinx.  A similar process was carried out on the right side allowing for bilateral tubal sterilization.  Good hemostasis was noted overall.  Local analgesia was injected into both Filshie application sites.The instruments were then removed from the patient's abdomen and the fascial incision was repaired with 0 Vicryl, and the skin was closed with a 4-0 Vicryl subcuticular stitch. The patient tolerated the procedure well.  Sponge, lap, and needle counts were correct times two.  The patient was then taken to the recovery room awake, extubated and in stable condition.  [name],[name] 04/21/2011 5:48 PM

## 2011-04-21 NOTE — Progress Notes (Signed)
[name] is a 25 y.o. [phone] at [redacted]w[redacted]d by ultrasound admitted for rupture of membranes  Subjective:   Objective: BP 100/43  Pulse 79  Temp(Src) 97.3 F (36.3 C) (Oral)  Resp 22  Ht 5\' 8"  (1.727 m)  Wt 85.73 kg (189 lb)  BMI 28.74 kg/m2  LMP 08/10/2010      FHT:  FHR: 140 bpm, variability: moderate,  accelerations:  Present,  decelerations:  Absent UC:   irregular, every 1-3 minutes SVE:   Dilation: 2.5 Effacement (%): 80 Station: -2 Exam by:: dlawson,cnm  Labs: Lab Results  Component Value Date   WBC 7.1 04/20/2011   HGB 12.1 04/20/2011   HCT 34.4* 04/20/2011   MCV 90.5 04/20/2011   PLT 196 04/20/2011    Assessment / Plan: Induction of labor due to PROM,  progressing well on pitocin  Labor: needs pit augmentation Preeclampsia:  no signs or symptoms of toxicity Fetal Wellbeing:  Category I Pain Control:  Epidural I/D:  n/a Anticipated MOD:  NSVD  [name] 04/21/2011, 9:42 AM

## 2011-04-21 NOTE — Addendum Note (Signed)
Addendum  created 04/21/11 1859 by Len Blalock   Modules edited:Notes Section

## 2011-04-21 NOTE — OR Nursing (Signed)
Foley cathter in on arrival to OR and secured to left thigh. Urine is slightly concentrated in color.

## 2011-04-21 NOTE — Anesthesia Procedure Notes (Addendum)
Epidural Patient location during procedure: OB Start time: 04/21/2011 10:19 AM End time: 04/21/2011 10:26 AM Reason for block: procedure for pain  Staffing Anesthesiologist: Sandrea Hughs  Preanesthetic Checklist Completed: patient identified, site marked, surgical consent, pre-op evaluation, timeout performed, IV checked, risks and benefits discussed and monitors and equipment checked  Epidural Patient position: sitting Prep: site prepped and draped and DuraPrep Patient monitoring: continuous pulse ox and blood pressure Approach: midline Injection technique: LOR air  Needle:  Needle type: Tuohy  Needle gauge: 17 G Needle length: 9 cm Needle insertion depth: 6 cm Catheter type: closed end flexible Catheter size: 19 Gauge Catheter at skin depth: 11 cm Test dose: negative and 1.5% lidocaine  Assessment Events: blood not aspirated, injection not painful, no injection resistance, negative IV test and no paresthesia

## 2011-04-21 NOTE — Transfer of Care (Signed)
Immediate Anesthesia Transfer of Care Note  Patient: [name]  Procedure(s) Performed:  POST PARTUM TUBAL LIGATION - Bilateral post partum tubal ligation with filshie clips.  Patient Location: PACU  Anesthesia Type: Epidural  Level of Consciousness: awake, alert  and oriented  Airway & Oxygen Therapy: Patient Spontanous Breathing  Post-op Assessment: Report given to PACU RN and Post -op Vital signs reviewed and stable  Post vital signs: stable  Complications: No apparent anesthesia complications

## 2011-04-21 NOTE — Anesthesia Postprocedure Evaluation (Signed)
Anesthesia Post Note  Patient: [name]  Procedure(s) Performed:  POST PARTUM TUBAL LIGATION - Bilateral post partum tubal ligation with filshie clips.  Anesthesia type: Epidural  Patient location: Mother/Baby  Post pain: Pain level controlled  Post assessment: Post-op Vital signs reviewed  Last Vitals:  Filed Vitals:   04/21/11 1755  BP: 142/113  Pulse: 124  Temp: 98.4 F (36.9 C)  Resp: 20    Post vital signs: Reviewed  Level of consciousness: awake  Complications: No apparent anesthesia complications

## 2011-04-22 NOTE — Progress Notes (Signed)
Post Partum Day 1 Subjective: no complaints, up ad lib, voiding, tolerating PO and + flatus.  Patient is POD#1 s/p BTL.  Objective: Blood pressure 91/54, pulse 83, temperature 97.6 F (36.4 C), temperature source Oral, resp. rate 16, height 5\' 8"  (1.727 m), weight 85.73 kg (189 lb), last menstrual period 08/10/2010, SpO2 100.00%, unknown if currently breastfeeding.  Physical Exam:  General: alert and no distress Lochia: appropriate Uterine Fundus: firm Incision: no significant drainage, no significant erythema DVT Evaluation: No evidence of DVT seen on physical exam.   Basename 04/20/11 1405  HGB 12.1  HCT 34.4*    Assessment/Plan: Plan for discharge tomorrow, Breastfeeding, Lactation consult and Contraception : Underwent PPBTL yesterday   LOS: 2 days   Harper Smoker A 04/22/2011, 6:36 AM

## 2011-04-22 NOTE — Progress Notes (Signed)
SW received referral from NICU staff for NICU admission on 04/22/11.  SW met with parents in MOB's third floor room to introduce myself, complete assessment and evaluate how they are coping with baby's admission to NICU.  Parents were very friendly and were glad that SW came to visit, as they stated that they were about to ask to speak to a SW.  They stated that it will be hard for them to get back and forth to visit baby since they live 70 miles round trip from the hospital.  SW offered gas cards and they were very appreciative.  SW gave $20 in gas cards.  They state they have good supports and everything they need for baby at home.  Baby will see Dr. Bailey at [facility] for follow up.  They state no further questions or needs at this time.   

## 2011-04-23 ENCOUNTER — Encounter (HOSPITAL_COMMUNITY): Payer: Self-pay | Admitting: Obstetrics & Gynecology

## 2011-04-23 MED ORDER — HYDROCORTISONE 1 % EX CREA
TOPICAL_CREAM | Freq: Three times a day (TID) | CUTANEOUS | Status: DC
  Administered 2011-04-23: 08:00:00 via TOPICAL
  Filled 2011-04-23: qty 28

## 2011-04-23 MED ORDER — DOCUSATE SODIUM 100 MG PO CAPS: 100.0000 mg | ORAL_CAPSULE | Freq: Two times a day (BID) | ORAL | Status: AC

## 2011-04-23 MED ORDER — OXYCODONE-ACETAMINOPHEN 5-325 MG PO TABS: 1.0000 | ORAL_TABLET | ORAL | Status: AC | PRN

## 2011-04-23 MED ORDER — IBUPROFEN 600 MG PO TABS: 600.0000 mg | ORAL_TABLET | Freq: Four times a day (QID) | ORAL | Status: AC

## 2011-04-23 NOTE — Progress Notes (Signed)
UR Chart review completed.  

## 2011-04-23 NOTE — Progress Notes (Signed)
Post Partum Day 2, Post Op Day 2 BTL Subjective: no complaints, up ad lib, voiding, tolerating PO, + flatus and BM.  No lightheadedness or vision changes, Pain is well controlled with medication  Objective: Blood pressure 94/53, pulse 69, temperature 98 F (36.7 C), temperature source Oral, resp. rate 18, height 5\' 8"  (1.727 m), weight 85.73 kg (189 lb), last menstrual period 08/10/2010, SpO2 95.00%, unknown if currently breastfeeding.  Physical Exam:  General: alert, cooperative and no distress Lochia: appropriate Uterine Fundus: firm, below umbilicus Abd: Soft, Appropriately tender Resp: CTA Cardio: RRR, no murmurs Incision: healing well, no significant drainage, no dehiscence, no significant erythema DVT Evaluation: No evidence of DVT seen on physical exam. Negative Homan's sign. No cords or calf tenderness.   Basename 04/20/11 1405  HGB 12.1  HCT 34.4*    Assessment/Plan: Discharge home, Breastfeeding Social Work consult from NICU by patient request.   LOS: 3 days   Deniece Portela 04/23/2011, 6:48 AM    Addendum: I have seen patient and agree with assessment and plan.  Rash present on abdomen likely from tape, will use benadryl and hydrocortisone cream.  Delton See MD

## 2011-04-23 NOTE — Discharge Summary (Signed)
Obstetric Discharge Summary Reason for Admission: onset of labor Prenatal Procedures: ultrasound Intrapartum Procedures: spontaneous vaginal delivery Postpartum Procedures: BTL Complications-Operative and Postpartum: none Hemoglobin  Date Value Range Status  04/20/2011 12.1  12.0-15.0 (g/dL) Final     HCT  Date Value Range Status  04/20/2011 34.4* 36.0-46.0 (%) Final    Discharge Diagnoses: Premature labor  Discharge Information: Date: 04/23/2011 Activity: pelvic rest Diet: routine Medications: Ibuprophen, Colace and Percocet Condition: stable Instructions: refer to practice specific booklet Discharge to: home Follow-up Information    Follow up with [facility] [place]. Make an appointment in 6 weeks. (postpartum visit)    Contact information:   [address] [place] [address]          Newborn Data: Live born female  Birth Weight: 5 lb 4.7 oz (2401 g) APGAR: 7, 8  Home with NICU.  Lindaann Slough MD 04/23/2011, 7:30 AM

## 2011-04-23 NOTE — Progress Notes (Signed)
Pt discharged to home with husband.  Condition stable.  Pt ambulated to car with NT.   No equipment at discharge - plans to get loaner electric breastpump from NICU.

## 2011-04-25 ENCOUNTER — Encounter: Payer: Private Health Insurance - Indemnity | Admitting: Family Medicine

## 2011-05-09 ENCOUNTER — Telehealth: Payer: Self-pay | Admitting: *Deleted

## 2011-05-09 DIAGNOSIS — F53 Postpartum depression: Secondary | ICD-10-CM

## 2011-05-09 MED ORDER — CITALOPRAM HYDROBROMIDE 10 MG PO TABS: 10.0000 mg | ORAL_TABLET | Freq: Every day | ORAL | Status: DC

## 2011-05-09 NOTE — Telephone Encounter (Signed)
Patient is feeling very upset and frustrated.  She feels depressed.  Her baby girl is still in the NICU and her home date continues to get pushed out due to feeding issues.  Her husband is out of town in [place] and her young son is asking a lot of questions and wanting mommy to be home.  She wishes to start something to help her.    We will call in Celexa 10mg .  She will follow up for her already scheduled appointment on September 19 with Dr. Jolayne Panther.  She does not want to harm herself or anyone else.  She is just feeling very overwhelmed.  She promised to call the office if anything worsens or changes.

## 2011-05-21 ENCOUNTER — Encounter: Payer: Private Health Insurance - Indemnity | Admitting: Obstetrics & Gynecology

## 2011-05-22 ENCOUNTER — Ambulatory Visit (INDEPENDENT_AMBULATORY_CARE_PROVIDER_SITE_OTHER): Payer: Private Health Insurance - Indemnity | Admitting: Obstetrics and Gynecology

## 2011-05-22 ENCOUNTER — Other Ambulatory Visit: Payer: Self-pay | Admitting: *Deleted

## 2011-05-22 ENCOUNTER — Encounter: Payer: Self-pay | Admitting: Obstetrics and Gynecology

## 2011-05-22 DIAGNOSIS — O99345 Other mental disorders complicating the puerperium: Secondary | ICD-10-CM

## 2011-05-22 MED ORDER — ZOLPIDEM TARTRATE 10 MG PO TABS: 10.0000 mg | ORAL_TABLET | Freq: Every evening | ORAL | Status: DC | PRN

## 2011-05-22 MED ORDER — FLUOXETINE HCL 20 MG PO CAPS: 20.0000 mg | ORAL_CAPSULE | ORAL | Status: DC

## 2011-05-22 NOTE — Patient Instructions (Signed)
Postpartum Depression Depression During and After Pregnancy After delivery, your body is going through a drastic change in hormone levels. You may find yourself crying for no apparent reason and unable to cope with all the changes a new baby brings. This is a common response following a pregnancy. Seek support from your partner and/or friends and just give yourself time to recover. If these feelings persist and you feel you are getting worse, contact your caregiver or other professionals who can help you. WHAT IS DEPRESSION? Depression can be described as feeling sad, blue, unhappy, miserable, or down in the dumps. Most of us feel this way at one time or another for short periods. But true clinical depression is a mood disorder in which feelings of sadness, loss, anger, fear, or frustration interfere with everyday life for an extended time. Depression can be mild, moderate, or severe. The degree of depression, which your caregiver can determine, influences your treatment. Postpartum depression occurs within a couple days to months after delivering your baby. HOW COMMON IS DEPRESSION DURING AND AFTER PREGNANCY? Depression that occurs during pregnancy or within a year after delivery is called perinatal depression. Depression after pregnancy is also called postpartum depression or peripartum depression. The exact number of women with depression during this time is unknown, but it occurs in between 10-15% of women. Researchers believe that depression is one of the most common complications during and after pregnancy. The depression is often not recognized or treated, because some normal pregnancy changes cause similar symptoms and are happening at the same time. Tiredness, problems sleeping, stronger emotional reactions, and changes in body weight may occur during and after pregnancy. But these symptoms may also be signs of depression.  WHAT CAUSES DEPRESSION? There may be a number of reasons why a woman gets  depressed.  Rapid hormone changes. Estrogen and progesterone usually decrease immediately after delivering your baby. Researchers think the fast change in hormone levels may lead to depression, just as smaller changes in hormones can affect a woman's moods before she gets her menstrual period.   Decrease in thyroid hormone. Thyroid hormone regulates how your body uses and stores energy from food (metabolism). A simple blood test can tell if this condition is causing a woman's depression. If so, thyroid medicine can be prescribed by your caregiver.   A stressful life event, such as a death in the family. This can cause chemical changes in the brain that lead to depression.   Feeling overwhelmed by caring for and raising a new baby.   Depression is also an illness that runs in some families. It is not always clear what causes depression.  FACTORS THAT MAY INCREASE A WOMAN'S CHANCE OF DEPRESSION DURING PREGNANCY:  History of depression.  Substance abuse, alcohol, or drugs.   Little support from family and friends.   Problems with previous pregnancy or birth.   Young age for motherhood.   Living alone.   Little or no social support.   Family history of mental illness.   Anxiety about the fetus.   Marital or financial problems.   Postpartum depression in a previous pregnancy.   Having a psychiatric illness (schizophrenia, bipolar disorder).   Going through a difficult or stressful pregnancy.   Going through a difficult labor and delivery.   Moving to another city or state during your pregnancy, or just after delivering your baby.   OTHER FACTORS THAT MAY CONTRIBUTE TO POSTPARTUM DEPRESSION INCLUDE:   Feeling tired after delivery, broken sleep patterns, and not   getting enough rest. This often keeps a new mother from regaining her full strength for weeks.   Feeling overwhelmed with a new baby to take care of and doubting your ability to be a good mother.   Feeling stress from  changes in work and home routines. Women sometimes think they need to be "super mom" or perfect. This is not realistic and can add stress.   Having feelings of loss. This can include loss of the identity of who you are, or were, before having the baby, loss of control, loss of your pre-pregnancy figure, and feeling less attractive.   Having less free time and less control over your time. Needing to stay home, indoors, for longer periods of time and having less time to spend with your partner and loved ones can contribute to depression.   Having trouble doing your daily activities at home or at work.   Fears about not knowing how to take of the baby correctly and about harming the baby.   Feelings of guilt that you are not taking care of the baby properly.  SYMPTOMS Any of these symptoms, during and after pregnancy, that last longer than 2 weeks are signs of depression:  Feeling restless or irritable.   Feeling sad, hopeless, and overwhelmed.   Crying a lot.   Having no energy or motivation.   Eating too little or too much.   Sleeping too little or too much.   Trouble focusing, remembering, or making decisions.   Feeling worthless and guilty.   Loss of interest or pleasure in activities.   Withdrawal from friends and family.   Having headaches, chest pains, rapid or irregular heartbeat (palpitations), or fast and shallow breathing (hyperventilation).   After pregnancy, being afraid of hurting the baby or oneself, and not having any interest in the baby.   Not being able to care for yourself or the baby.   Loss of interest in caring for the baby.   Anxiety and panic attacks.   Thoughts of harming yourself, the baby, or someone else.   Feelings of guilt because you feel you are not taking care of the baby well enough.  WHAT IS THE DIFFERENCE BETWEEN "BABY BLUES," POSTPARTUM DEPRESSION, AND POSTPARTUM PSYCHOSIS?  The "baby blues" occurs 70 to 80% of the time, and it can  happen in the days right after childbirth. It normally goes away within a few days to a week. A new mother can have sudden mood swings, sadness, crying spells, loss of appetite, sleeping problems, and feel irritable, restless, anxious, and lonely. Symptoms are not severe and treatment usually is not needed. But there are things you can do to feel better. Nap when the baby does. Ask for help from your spouse, family members, and friends. Join a support group of new moms or talk with other moms. If the "baby blues" does not go away in a week to 10 days or gets worse, you may have postpartum depression.   Postpartum depression can happen anytime within the first year after childbirth. A woman may have a number of symptoms, such as sadness, lack of energy, trouble concentrating, anxiety, and feelings of guilt and worthlessness. The difference between postpartum depression and the "baby blues" is that the feelings in postpartum depression are much stronger and often affects a woman's well-being. It keeps her from functioning well for a longer period of time. Postpartum depression needs to be treated by a caregiver. Counseling, support groups, and medicines can help.     Postpartum psychosis is rare. It occurs in 1 or 2 out of every 1000 births. It usually begins in the first 6 weeks after delivery. Women who have bipolar disorder, schizoaffective disorder, or family history of psychotic disease have a higher risk for developing postpartum psychosis. Symptoms may include delusions, hallucinations, sleep disturbances, and obsessive thoughts about the baby. A woman may have rapid mood swings, from depression, to irritability, to euphoria. This is a serious condition and needs professional care and treatment.  WHAT STEPS CAN I TAKE IF I HAVE SYMPTOMS OF DEPRESSION DURING PREGNANCY OR AFTER CHILDBIRTH?  Some women do not tell anyone about their symptoms, because they feel embarrassed, ashamed, or guilty about feeling  depressed when they are supposed to be happy. They worry that they will be viewed as unfit parents. Perinatal depression can happen to any woman. It does not mean you are a bad or a "not together" mom. You and your baby do not need to suffer. There is help. You should discuss these feelings with your spouse or partner, family, and caregiver.   There are different types of individual and group "talk therapies" that can help a woman with perinatal depression feel better and do better as a mom and as a person. Limited research suggests that many women with perinatal depression improve when treated with antidepressant medicine. Your caregiver can help you learn more about these options and decide which approach is best for you and your baby.   Speak to your caregiver if you are having symptoms of depression while you are pregnant or after you deliver your baby. Your caregiver can give you a questionnaire to test for depression. You can also be referred to a mental health professional who specializes in treating depression.  HOME CARE INSTRUCTIONS  Try to get as much rest as you can. Try to nap when the baby naps.   Stop putting pressure on yourself to do everything. Do as much as you can and leave the rest.   Ask for help with household chores and nighttime feedings. Ask your husband or partner to bring the baby to you, so you can breast-feed. If you can, have a friend, family member, or professional support person help you in the home for part of the day.   Talk to your husband, partner, family, and friends about how you are feeling.   Do not spend a lot of time alone. Get dressed and leave the house. Run an errand or take a short walk.   Spend time alone with your husband or partner.   Talk with other mothers, so you can learn from their experiences.   Join a support group for women with depression. Call a local hotline or look in your telephone book for information and services.   Do not make  any major life changes during pregnancy. Major changes can cause unneeded stress. However, sometimes big changes cannot be avoided. Arrange support and help in your new situation ahead of time.   Exercise regularly.   Eat a balanced and nourishing diet.   Seek help if there are marital or financial problems.   Take the medicine your caregiver gives, as directed.   Keep all your postpartum appointments.  HOW IS DEPRESSION TREATED? There are 2 common types of treatment for depression.  Talk therapy. This involves talking to a therapist, psychologist, clergyperson, or social worker, in order to learn to change how depression makes you think, feel, and act.   Medicine. Your caregiver can give   you an antidepressant medicine to help you. These medicines can help relieve the symptoms of depression.   Women who are pregnant or breast-feeding should talk with their caregivers about the advantages and risks of taking antidepressant medicines. Some women are concerned that taking these medicines may harm the baby. A mother's depression can affect her baby's development. Getting treatment is important for both mother and baby. The risks of taking medicine must be weighed against the risks of depression. It is a decision that women need to discuss carefully with their caregivers. Women who decide to take antidepressant medicines should talk to their caregivers about which antidepressant medicines are safer to take while pregnant or breast-feeding.  What effects can untreated depression have?  Depression not only hurts the mother, but it also affects her family. Some researchers have found that depression during pregnancy can raise the risk of delivering an underweight baby or a premature infant. Some women with depression have difficulty caring for themselves during pregnancy. They may have trouble eating and do not gain enough weight during the pregnancy. They may also have trouble sleeping, may miss  prenatal visits, may not follow medical instructions, have a poor diet, or may use harmful substances, like tobacco, alcohol, or illegal drugs.   Postpartum depression can affect a mother's ability to parent. She may lack energy, have trouble concentrating, be irritable, and not be able to meet her child's needs for love and affection. As a result, she may feel guilty and lose confidence in herself as a mother. This can make the depression worse. Researchers believe that postpartum depression can affect the infant by causing delays in language development, problems with emotional bonding to others, behavioral problems, lower activity levels, sleep problems, and distress. It helps if the father or another caregiver can assist in meeting the needs of the baby, and other children in the family, while the mother is depressed.   All children deserve the chance to have a healthy mom. All moms deserve the chance to enjoy their life and their children. Do not suffer alone. If you are experiencing symptoms of depression during pregnancy or after having a baby, tell a loved one and call your caregiver right away.  SEEK MEDICAL CARE IF:  You think you have postpartum depression.   You want medicine to treat your postpartum depression.   You want a referral to a psychiatrist or psychologist.   You are having a reaction or problems with your medicine.  SEEK IMMEDIATE MEDICAL CARE IF:  You have suicidal feelings.   You feel you may harm the baby.   You feel you may harm your spouse/partner, or someone else.   You feel you need to be admitted to a hospital now.   You feel you are losing control and need treatment immediately.  FOR MORE INFORMATION You can find out more about depression during and after pregnancy by contacting the National Women's Health Information Center (NWHIC) at 1-800-994-9662 or the following organizations:  National Institute of Mental Health, NIH, HHS  Phone: (301) 496-9576    Internet Address: http://www.nimh.nih.gov   American Psychological Association  Phone: (800) 374-2721  Internet Address: http://www.apa.org   Postpartum Education for Parents  Phone: (805) 564-3888  Internet Address: http://www.sbpep.org   National Mental Health Information Center, SAMHSA, HHS Phone: (800) 789-2647 Internet Address: http://www.mentalhealth.org   National Mental Health Association Phone: (800) 969-NMHA Internet Address: http://www.nmha.org   Postpartum Support International Phone: (800) 773-6667 Internet Address: http://www.postpartum.net   Document Released: 05/23/2004 Document Re-Released:   02/06/2010 [employer] Patient Information 2011 [employer]. 

## 2011-05-22 NOTE — Progress Notes (Signed)
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  She has started the Celexa and says that it is helping but it is also giving her a daily headache.  She is feeling a little better since her daughter got to come home this past Saturday.  She would also like to have a refill of Ambien as she is out and it works well for her.

## 2011-05-22 NOTE — Progress Notes (Signed)
  Subjective:    Patient ID: [name], female    DOB: 1986-05-17, 25 y.o.   MRN: [id number]  HPI 25yo [id number] s/p SVD on 04/21/2011 at 34 wks secondary to PPROM presenting today for postpartum exam. Patient reports feeling better. She was recently diagnosed with postpartum depression and started on Celexa and states that the medications has been working well for her but she still feels very stressed and overwhelmed. 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 Patient reports having ample of support. She is breastfeeding and the infant is doing well. Patient is using BTL for birth control and has had intercourse without any complications.   Review of Systems Negative    Objective:   Physical Exam  GENERAL: Well-developed, well-nourished female in no acute distress.  HEENT: Normocephalic, atraumatic. Sclerae anicteric.  NECK: Supple. Normal thyroid.  LUNGS: Clear to auscultation bilaterally.  HEART: Regular rate and rhythm. BREASTS: Symmetric in size. No palpable masses or lymphadenopathy, skin changes, or nipple drainage. ABDOMEN: Soft, nontender, nondistended. No organomegaly. PELVIC: Normal external female genitalia. Vagina is pink and rugated.  Normal discharge. Normal appearing cervix. Uterus is normal in size.  No adnexal mass or tenderness. EXTREMITIES: No cyanosis, clubbing, or edema, 2+ distal pulses.       Assessment & Plan:  25 yo [id number] s/p SVD at 34 weeks secondary to PPROM here for postpartum exam - Postpartum depression is improving - Patient has had ppBTL for birthcontrol - Will change prescription to Prozac 20mg  daily - RTC in 1 month for follow-up on depression - Will give a note to return to work in 1 month secondary to her overwhelming feelings. - Patient is to return in February for annual exam

## 2011-05-22 NOTE — Telephone Encounter (Signed)
Doctor wishes to change Celexa to Prozac and refill Ambien.  She was not able to do this on her pp navigator so I am sending the prescriptions.

## 2011-06-18 ENCOUNTER — Ambulatory Visit (INDEPENDENT_AMBULATORY_CARE_PROVIDER_SITE_OTHER): Payer: Private Health Insurance - Indemnity | Admitting: Obstetrics and Gynecology

## 2011-06-18 VITALS — BP 129/65 | HR 92 | Wt 181.0 lb

## 2011-06-18 DIAGNOSIS — IMO0002 Reserved for concepts with insufficient information to code with codable children: Secondary | ICD-10-CM

## 2011-06-18 DIAGNOSIS — F53 Postpartum depression: Secondary | ICD-10-CM

## 2011-06-18 MED ORDER — ZOLPIDEM TARTRATE 10 MG PO TABS: 5.0000 mg | ORAL_TABLET | Freq: Every evening | ORAL | Status: AC | PRN

## 2011-06-18 NOTE — Progress Notes (Signed)
Patient presents today to obtain medical clearance to return to work. Patient is doing well without any complaints. Patient is still breastfeeding and has had her first period in early September and bled for 3 weeks with the last week being mostly spotting. Patient was asked to keep a menstrual calendar as this was her first period. It was explain to the patient that menses are irregular to absent while breastfeeding and further evaluation will be warranted when a pattern has been established.  Patient to return in February for annual exam

## 2011-08-06 ENCOUNTER — Emergency Department: Payer: Self-pay | Admitting: Emergency Medicine

## 2012-05-20 ENCOUNTER — Telehealth: Payer: Self-pay | Admitting: *Deleted

## 2012-05-20 DIAGNOSIS — B379 Candidiasis, unspecified: Secondary | ICD-10-CM

## 2012-05-20 MED ORDER — FLUCONAZOLE 150 MG PO TABS: 150.0000 mg | ORAL_TABLET | Freq: Once | ORAL | Status: DC

## 2012-05-20 NOTE — Telephone Encounter (Signed)
Patient called to set up appointment for yearly exam and would like for [place] to call something in for a yeast infection.

## 2012-05-29 ENCOUNTER — Ambulatory Visit (INDEPENDENT_AMBULATORY_CARE_PROVIDER_SITE_OTHER): Payer: Private Health Insurance - Indemnity | Admitting: Obstetrics & Gynecology

## 2012-05-29 ENCOUNTER — Encounter: Payer: Self-pay | Admitting: Obstetrics & Gynecology

## 2012-05-29 VITALS — BP 104/65 | HR 79 | Ht 67.0 in | Wt 192.0 lb

## 2012-05-29 DIAGNOSIS — Z113 Encounter for screening for infections with a predominantly sexual mode of transmission: Secondary | ICD-10-CM

## 2012-05-29 DIAGNOSIS — Z01419 Encounter for gynecological examination (general) (routine) without abnormal findings: Secondary | ICD-10-CM

## 2012-05-29 DIAGNOSIS — Z124 Encounter for screening for malignant neoplasm of cervix: Secondary | ICD-10-CM

## 2012-05-29 NOTE — Progress Notes (Signed)
  Subjective:     [name] is a 26 y.o. [phone] female and is here for a comprehensive physical gynecologic exam. The patient reports infidelity of her husband, desires STI screen. No GYN concerns. S/p BTL.  History   Social History  . Marital Status: Married    Spouse Name: N/A    Number of Children: N/A  . Years of Education: N/A   Occupational History  . Not on file.   Social History Main Topics  . Smoking status: Never Smoker   . Smokeless tobacco: Never Used  . Alcohol Use: Yes     occasion  . Drug Use: No  . Sexually Active: Yes -- Female partner(s)    Birth Control/ Protection: Surgical     tubaligation.   Other Topics Concern  . Not on file   Social History Narrative  . No narrative on file   Health Maintenance  Topic Date Due  . Influenza Vaccine  05/03/2012  . Pap Smear  10/18/2013  . Tetanus/tdap  04/21/2021   The following portions of the patient's history were reviewed and updated as appropriate: allergies, current medications, past family history, past medical history, past social history, past surgical history and problem list.  Review of Systems Pertinent items are noted in HPI.   Objective:    Blood pressure 104/65, pulse 79, height 5\' 7"  (1.702 m), weight 192 lb (87.091 kg), last menstrual period 05/22/2012, not currently breastfeeding. GENERAL: Well-developed, well-nourished female in no acute distress.  HEENT: Normocephalic, atraumatic. Sclerae anicteric.  NECK: Supple. Normal thyroid.  LUNGS: Clear to auscultation bilaterally.  HEART: Regular rate and rhythm. BREASTS: Symmetric with everted nipples. No masses, skin changes, nipple drainage, or lymphadenopathy. ABDOMEN: Soft, nontender, nondistended. No organomegaly. PELVIC: Normal external female genitalia. Vagina is pink and rugated.  Normal discharge. Normal cervix contour, prominent ectropion noted. Pap smear obtained. Uterus is normal in size. No adnexal mass or tenderness.    EXTREMITIES: No cyanosis, clubbing, or edema, 2+ distal pulses.   Assessment:    Healthy female exam.  Desires STI screen     Plan:    Pap and STI screen done, follow up results and manage accordingly. Routine preventative health maintenance measures emphasized See After Visit Summary for othereling Recommendations

## 2012-05-29 NOTE — Patient Instructions (Signed)
Preventive Care for Adults, Female A healthy lifestyle and preventive care can promote health and wellness. Preventive health guidelines for women include the following key practices.  A routine yearly physical is a good way to check with your caregiver about your health and preventive screening. It is a chance to share any concerns and updates on your health, and to receive a thorough exam.   Visit your dentist for a routine exam and preventive care every 6 months. Brush your teeth twice a day and floss once a day. Good oral hygiene prevents tooth decay and gum disease.   The frequency of eye exams is based on your age, health, family medical history, use of contact lenses, and other factors. Follow your caregiver's recommendations for frequency of eye exams.   Eat a healthy diet. Foods like vegetables, fruits, whole grains, low-fat dairy products, and lean protein foods contain the nutrients you need without too many calories. Decrease your intake of foods high in solid fats, added sugars, and salt. Eat the right amount of calories for you.Get information about a proper diet from your caregiver, if necessary.   Regular physical exercise is one of the most important things you can do for your health. Most adults should get at least 150 minutes of moderate-intensity exercise (any activity that increases your heart rate and causes you to sweat) each week. In addition, most adults need muscle-strengthening exercises on 2 or more days a week.   Maintain a healthy weight. The body mass index (BMI) is a screening tool to identify possible weight problems. It provides an estimate of body fat based on height and weight. Your caregiver can help determine your BMI, and can help you achieve or maintain a healthy weight.For adults 20 years and older:   A BMI below 18.5 is considered underweight.   A BMI of 18.5 to 24.9 is normal.   A BMI of 25 to 29.9 is considered overweight.   A BMI of 30 and above  is considered obese.   Maintain normal blood lipids and cholesterol levels by exercising and minimizing your intake of saturated fat. Eat a balanced diet with plenty of fruit and vegetables. Blood tests for lipids and cholesterol should begin at age 54 and be repeated every 5 years. If your lipid or cholesterol levels are high, you are over 50, or you are at high risk for heart disease, you may need your cholesterol levels checked more frequently.Ongoing high lipid and cholesterol levels should be treated with medicines if diet and exercise are not effective.   If you smoke, find out from your caregiver how to quit. If you do not use tobacco, do not start.   If you are pregnant, do not drink alcohol. If you are breastfeeding, be very cautious about drinking alcohol. If you are not pregnant and choose to drink alcohol, do not exceed 1 drink per day. One drink is considered to be 12 ounces (355 mL) of beer, 5 ounces (148 mL) of wine, or 1.5 ounces (44 mL) of liquor.   Avoid use of street drugs. Do not share needles with anyone. Ask for help if you need support or instructions about stopping the use of drugs.   High blood pressure causes heart disease and increases the risk of stroke. Your blood pressure should be checked at least every 1 to 2 years. Ongoing high blood pressure should be treated with medicines if weight loss and exercise are not effective.   If you are 55 to 26  years old, ask your caregiver if you should take aspirin to prevent strokes.   Diabetes screening involves taking a blood sample to check your fasting blood sugar level. This should be done once every 3 years, after age 39, if you are within normal weight and without risk factors for diabetes. Testing should be considered at a younger age or be carried out more frequently if you are overweight and have at least 1 risk factor for diabetes.   Breast cancer screening is essential preventive care for women. You should practice  "breast self-awareness." This means understanding the normal appearance and feel of your breasts and may include breast self-examination. Any changes detected, no matter how small, should be reported to a caregiver. Women in their 56s and 30s should have a clinical breast exam (CBE) by a caregiver as part of a regular health exam every 1 to 3 years. After age 16, women should have a CBE every year. Starting at age 51, women should consider having a mammography (breast X-ray test) every year. Women who have a family history of breast cancer should talk to their caregiver about genetic screening. Women at a high risk of breast cancer should talk to their caregivers about having magnetic resonance imaging (MRI) and a mammography every year.   The Pap test is a screening test for cervical cancer. A Pap test can show cell changes on the cervix that might become cervical cancer if left untreated. A Pap test is a procedure in which cells are obtained and examined from the lower end of the uterus (cervix).   Women should have a Pap test starting at age 30.   Between ages 79 and 67, Pap tests should be repeated every 2 years.   Beginning at age 54, you should have a Pap test every 3 years as long as the past 3 Pap tests have been normal.   Some women have medical problems that increase the chance of getting cervical cancer. Talk to your caregiver about these problems. It is especially important to talk to your caregiver if a new problem develops soon after your last Pap test. In these cases, your caregiver may recommend more frequent screening and Pap tests.   The above recommendations are the same for women who have or have not gotten the vaccine for human papillomavirus (HPV).   If you had a hysterectomy for a problem that was not cancer or a condition that could lead to cancer, then you no longer need Pap tests. Even if you no longer need a Pap test, a regular exam is a good idea to make sure no other  problems are starting.   If you are between ages 63 and 40, and you have had normal Pap tests going back 10 years, you no longer need Pap tests. Even if you no longer need a Pap test, a regular exam is a good idea to make sure no other problems are starting.   If you have had past treatment for cervical cancer or a condition that could lead to cancer, you need Pap tests and screening for cancer for at least 20 years after your treatment.   If Pap tests have been discontinued, risk factors (such as a new sexual partner) need to be reassessed to determine if screening should be resumed.   The HPV test is an additional test that may be used for cervical cancer screening. The HPV test looks for the virus that can cause the cell changes on the cervix.  The cells collected during the Pap test can be tested for HPV. The HPV test could be used to screen women aged 64 years and older, and should be used in women of any age who have unclear Pap test results. After the age of 38, women should have HPV testing at the same frequency as a Pap test.   Colorectal cancer can be detected and often prevented. Most routine colorectal cancer screening begins at the age of 62 and continues through age 35. However, your caregiver may recommend screening at an earlier age if you have risk factors for colon cancer. On a yearly basis, your caregiver may provide home test kits to check for hidden blood in the stool. Use of a small camera at the end of a tube, to directly examine the colon (sigmoidoscopy or colonoscopy), can detect the earliest forms of colorectal cancer. Talk to your caregiver about this at age 11, when routine screening begins. Direct examination of the colon should be repeated every 5 to 10 years through age 47, unless early forms of pre-cancerous polyps or small growths are found.   Hepatitis C blood testing is recommended for all people born from [birth year] through 1965 and any individual with known risks for  hepatitis C.   Practice safe sex. Use condoms and avoid high-risk sexual practices to reduce the spread of sexually transmitted infections (STIs). STIs include gonorrhea, chlamydia, syphilis, trichomonas, herpes, HPV, and human immunodeficiency virus (HIV). Herpes, HIV, and HPV are viral illnesses that have no cure. They can result in disability, cancer, and death. Sexually active women aged 36 and younger should be checked for chlamydia. Older women with new or multiple partners should also be tested for chlamydia. Testing for other STIs is recommended if you are sexually active and at increased risk.   Osteoporosis is a disease in which the bones lose minerals and strength with aging. This can result in serious bone fractures. The risk of osteoporosis can be identified using a bone density scan. Women ages 96 and over and women at risk for fractures or osteoporosis should discuss screening with their caregivers. Ask your caregiver whether you should take a calcium supplement or vitamin D to reduce the rate of osteoporosis.   Menopause can be associated with physical symptoms and risks. Hormone replacement therapy is available to decrease symptoms and risks. You should talk to your caregiver about whether hormone replacement therapy is right for you.   Use sunscreen with sun protection factor (SPF) of 30 or more. Apply sunscreen liberally and repeatedly throughout the day. You should seek shade when your shadow is shorter than you. Protect yourself by wearing long sleeves, pants, a wide-brimmed hat, and sunglasses year round, whenever you are outdoors.   Once a month, do a whole body skin exam, using a mirror to look at the skin on your back. Notify your caregiver of new moles, moles that have irregular borders, moles that are larger than a pencil eraser, or moles that have changed in shape or color.   Stay current with required immunizations.   Influenza. You need a dose every fall (or winter). The  composition of the flu vaccine changes each year, so being vaccinated once is not enough.   Pneumococcal polysaccharide. You need 1 to 2 doses if you smoke cigarettes or if you have certain chronic medical conditions. You need 1 dose at age 67 (or older) if you have never been vaccinated.   Tetanus, diphtheria, pertussis (Tdap, Td). Get 1 dose of  Tdap vaccine if you are younger than age 65, are over 65 and have contact with an infant, are a healthcare worker, are pregnant, or simply want to be protected from whooping cough. After that, you need a Td booster dose every 10 years. Consult your caregiver if you have not had at least 3 tetanus and diphtheria-containing shots sometime in your life or have a deep or dirty wound.   HPV. You need this vaccine if you are a woman age 26 or younger. The vaccine is given in 3 doses over 6 months.   Measles, mumps, rubella (MMR). You need at least 1 dose of MMR if you were born in 1957 or later. You may also need a second dose.   Meningococcal. If you are age 19 to 21 and a first-year college student living in a residence hall, or have one of several medical conditions, you need to get vaccinated against meningococcal disease. You may also need additional booster doses.   Zoster (shingles). If you are age 60 or older, you should get this vaccine.   Varicella (chickenpox). If you have never had chickenpox or you were vaccinated but received only 1 dose, talk to your caregiver to find out if you need this vaccine.   Hepatitis A. You need this vaccine if you have a specific risk factor for hepatitis A virus infection or you simply wish to be protected from this disease. The vaccine is usually given as 2 doses, 6 to 18 months apart.   Hepatitis B. You need this vaccine if you have a specific risk factor for hepatitis B virus infection or you simply wish to be protected from this disease. The vaccine is given in 3 doses, usually over 6 months.  Preventive Services /  Frequency Ages 19 to 39  Blood pressure check.** / Every 1 to 2 years.   Lipid and cholesterol check.** / Every 5 years beginning at age 20.   Clinical breast exam.** / Every 3 years for women in their 20s and 30s.   Pap test.** / Every 2 years from ages 21 through 29. Every 3 years starting at age 30 through age 65 or 70 with a history of 3 consecutive normal Pap tests.   HPV screening.** / Every 3 years from ages 30 through ages 65 to 70 with a history of 3 consecutive normal Pap tests.   Hepatitis C blood test.** / For any individual with known risks for hepatitis C.   Skin self-exam. / Monthly.   Influenza immunization.** / Every year.   Pneumococcal polysaccharide immunization.** / 1 to 2 doses if you smoke cigarettes or if you have certain chronic medical conditions.   Tetanus, diphtheria, pertussis (Tdap, Td) immunization. / A one-time dose of Tdap vaccine. After that, you need a Td booster dose every 10 years.   HPV immunization. / 3 doses over 6 months, if you are 26 and younger.   Measles, mumps, rubella (MMR) immunization. / You need at least 1 dose of MMR if you were born in 1957 or later. You may also need a second dose.   Meningococcal immunization. / 1 dose if you are age 19 to 21 and a first-year college student living in a residence hall, or have one of several medical conditions, you need to get vaccinated against meningococcal disease. You may also need additional booster doses.   Varicella immunization.** / Consult your caregiver.   Hepatitis A immunization.** / Consult your caregiver. 2 doses, 6 to 18 months   apart.   Hepatitis B immunization.** / Consult your caregiver. 3 doses usually over 6 months.  Ages 40 to 64  Blood pressure check.** / Every 1 to 2 years.   Lipid and cholesterol check.** / Every 5 years beginning at age 20.   Clinical breast exam.** / Every year after age 40.   Mammogram.** / Every year beginning at age 40 and continuing for as  long as you are in good health. Consult with your caregiver.   Pap test.** / Every 3 years starting at age 30 through age 65 or 70 with a history of 3 consecutive normal Pap tests.   HPV screening.** / Every 3 years from ages 30 through ages 65 to 70 with a history of 3 consecutive normal Pap tests.   Fecal occult blood test (FOBT) of stool. / Every year beginning at age 50 and continuing until age 75. You may not need to do this test if you get a colonoscopy every 10 years.   Flexible sigmoidoscopy or colonoscopy.** / Every 5 years for a flexible sigmoidoscopy or every 10 years for a colonoscopy beginning at age 50 and continuing until age 75.   Hepatitis C blood test.** / For all people born from 1945 through 1965 and any individual with known risks for hepatitis C.   Skin self-exam. / Monthly.   Influenza immunization.** / Every year.   Pneumococcal polysaccharide immunization.** / 1 to 2 doses if you smoke cigarettes or if you have certain chronic medical conditions.   Tetanus, diphtheria, pertussis (Tdap, Td) immunization.** / A one-time dose of Tdap vaccine. After that, you need a Td booster dose every 10 years.   Measles, mumps, rubella (MMR) immunization. / You need at least 1 dose of MMR if you were born in 1957 or later. You may also need a second dose.   Varicella immunization.** / Consult your caregiver.   Meningococcal immunization.** / Consult your caregiver.   Hepatitis A immunization.** / Consult your caregiver. 2 doses, 6 to 18 months apart.   Hepatitis B immunization.** / Consult your caregiver. 3 doses, usually over 6 months.  Ages 65 and over  Blood pressure check.** / Every 1 to 2 years.   Lipid and cholesterol check.** / Every 5 years beginning at age 20.   Clinical breast exam.** / Every year after age 40.   Mammogram.** / Every year beginning at age 40 and continuing for as long as you are in good health. Consult with your caregiver.   Pap test.** /  Every 3 years starting at age 30 through age 65 or 70 with a 3 consecutive normal Pap tests. Testing can be stopped between 65 and 70 with 3 consecutive normal Pap tests and no abnormal Pap or HPV tests in the past 10 years.   HPV screening.** / Every 3 years from ages 30 through ages 65 or 70 with a history of 3 consecutive normal Pap tests. Testing can be stopped between 65 and 70 with 3 consecutive normal Pap tests and no abnormal Pap or HPV tests in the past 10 years.   Fecal occult blood test (FOBT) of stool. / Every year beginning at age 50 and continuing until age 75. You may not need to do this test if you get a colonoscopy every 10 years.   Flexible sigmoidoscopy or colonoscopy.** / Every 5 years for a flexible sigmoidoscopy or every 10 years for a colonoscopy beginning at age 50 and continuing until age 75.   Hepatitis   C blood test.** / For all people born from [birth year] through 1965 and any individual with known risks for hepatitis C.   Osteoporosis screening.** / A one-time screening for women ages 29 and over and women at risk for fractures or osteoporosis.   Skin self-exam. / Monthly.   Influenza immunization.** / Every year.   Pneumococcal polysaccharide immunization.** / 1 dose at age 70 (or older) if you have never been vaccinated.   Tetanus, diphtheria, pertussis (Tdap, Td) immunization. / A one-time dose of Tdap vaccine if you are over 65 and have contact with an infant, are a Research scientist (physical sciences), or simply want to be protected from whooping cough. After that, you need a Td booster dose every 10 years.   Varicella immunization.** / Consult your caregiver.   Meningococcal immunization.** / Consult your caregiver.   Hepatitis A immunization.** / Consult your caregiver. 2 doses, 6 to 18 months apart.   Hepatitis B immunization.** / Check with your caregiver. 3 doses, usually over 6 months.  ** Family history and personal history of risk and conditions may change your caregiver's  recommendations. Document Released: 10/15/2001 Document Revised: 08/08/2011 Document Reviewed: 01/14/2011 [facility] Patient Information 2012 [place], [place]. Thank you for enrolling in MyChart. Please follow the instructions below to securely access your online medical record. MyChart allows you to send messages to your doctor, view your test results, manage appointments, and more.   How Do I Sign Up? 1. In your Internet browser, go to [employer] and enter [email or web address]. 2. Click on the Sign Up Now link in the Sign In box. You will see the New Member Sign Up page. 3. Enter your MyChart Access Code exactly as it appears below. You will not need to use this code after you've completed the sign-up process. If you do not sign up before the expiration date, you must request a new code. MyChart Access Code: [email or web address] Expires: 06/28/2012 10:45 AM  4. Enter your Social Security Number ([id number]) and Date of Birth (mm/dd/yyyy) as indicated and click Submit. You will be taken to the next sign-up page. 5. Create a MyChart ID. This will be your MyChart login ID and cannot be changed, so think of one that is secure and easy to remember. 6. Create a MyChart password. You can change your password at any time. 7. Enter your Password Reset Question and Answer. This can be used at a later time if you forget your password.  8. Enter your e-mail address. You will receive e-mail notification when new information is available in MyChart. 9. Click Sign Up. You can now view your medical record.   Additional Information Remember, MyChart is NOT to be used for urgent needs. For medical emergencies, dial 911.

## 2012-05-30 LAB — HEPATITIS C ANTIBODY: HCV Ab: NEGATIVE

## 2012-05-30 LAB — SYPHILIS: RPR W/REFLEX TO RPR TITER AND TREPONEMAL ANTIBODIES, TRADITIONAL SCREENING AND DIAGNOSIS ALGORITHM

## 2012-05-30 LAB — HIV ANTIBODY (ROUTINE TESTING W REFLEX): HIV: NONREACTIVE

## 2012-05-30 LAB — HEPATITIS B SURFACE ANTIGEN: Hepatitis B Surface Ag: NEGATIVE

## 2012-07-23 ENCOUNTER — Telehealth: Payer: Self-pay | Admitting: *Deleted

## 2012-07-23 DIAGNOSIS — B9689 Other specified bacterial agents as the cause of diseases classified elsewhere: Secondary | ICD-10-CM

## 2012-07-23 MED ORDER — METRONIDAZOLE 500 MG PO TABS: 500.0000 mg | ORAL_TABLET | Freq: Two times a day (BID) | ORAL | Status: DC

## 2012-07-23 NOTE — Telephone Encounter (Signed)
Patient has a lotion like yellow discharge that is significantly heavier than her usual discharge, she has had bv in the past and feels this is the same.  She would like meds called in for her.

## 2012-07-27 ENCOUNTER — Telehealth: Payer: Self-pay | Admitting: *Deleted

## 2012-07-27 NOTE — Telephone Encounter (Signed)
Patient went to [facility] and not [facility] to pick up her Diflucan, I let patient know that it was called in, but to [facility].  She will pick up the med there.

## 2012-09-02 HISTORY — PX: OTHER SURGICAL HISTORY: SHX169

## 2012-09-25 ENCOUNTER — Telehealth: Payer: Self-pay | Admitting: *Deleted

## 2012-09-25 DIAGNOSIS — B9689 Other specified bacterial agents as the cause of diseases classified elsewhere: Secondary | ICD-10-CM

## 2012-09-25 MED ORDER — METRONIDAZOLE 500 MG PO TABS: 500.0000 mg | ORAL_TABLET | Freq: Two times a day (BID) | ORAL | Status: DC

## 2012-09-25 NOTE — Telephone Encounter (Signed)
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  We will call in a refill of metronidazole

## 2012-10-17 ENCOUNTER — Other Ambulatory Visit: Payer: Self-pay

## 2012-12-14 ENCOUNTER — Inpatient Hospital Stay (HOSPITAL_COMMUNITY)
Admission: AD | Admit: 2012-12-14 | Discharge: 2012-12-14 | Disposition: A | Discharge status: Home / Self Care | Payer: Managed Care, Other (non HMO) | Source: Ambulatory Visit | Attending: Obstetrics & Gynecology | Admitting: Obstetrics & Gynecology

## 2012-12-14 ENCOUNTER — Ambulatory Visit (INDEPENDENT_AMBULATORY_CARE_PROVIDER_SITE_OTHER): Payer: Private Health Insurance - Indemnity | Admitting: Obstetrics & Gynecology

## 2012-12-14 ENCOUNTER — Inpatient Hospital Stay (HOSPITAL_COMMUNITY): Payer: Managed Care, Other (non HMO)

## 2012-12-14 ENCOUNTER — Encounter (HOSPITAL_COMMUNITY): Payer: Self-pay | Admitting: *Deleted

## 2012-12-14 DIAGNOSIS — Z3009 Encounter for other general counseling and advice on contraception: Secondary | ICD-10-CM

## 2012-12-14 DIAGNOSIS — R110 Nausea: Secondary | ICD-10-CM | POA: Insufficient documentation

## 2012-12-14 DIAGNOSIS — Z9851 Tubal ligation status: Secondary | ICD-10-CM

## 2012-12-14 DIAGNOSIS — O99891 Other specified diseases and conditions complicating pregnancy: Secondary | ICD-10-CM | POA: Insufficient documentation

## 2012-12-14 DIAGNOSIS — M545 Low back pain, unspecified: Secondary | ICD-10-CM | POA: Insufficient documentation

## 2012-12-14 DIAGNOSIS — N949 Unspecified condition associated with female genital organs and menstrual cycle: Secondary | ICD-10-CM | POA: Insufficient documentation

## 2012-12-14 DIAGNOSIS — Z3201 Encounter for pregnancy test, result positive: Secondary | ICD-10-CM

## 2012-12-14 HISTORY — DX: Adverse effect of unspecified anesthetic, initial encounter: T41.45XA

## 2012-12-14 HISTORY — DX: Other complications of anesthesia, initial encounter: T88.59XA

## 2012-12-14 LAB — POCT PREGNANCY, URINE: Preg Test, Ur: POSITIVE — AB

## 2012-12-14 LAB — WET PREP, GENITAL: Clue Cells Wet Prep HPF POC: NONE SEEN

## 2012-12-14 LAB — URINE MICROSCOPIC-ADD ON

## 2012-12-14 LAB — URINALYSIS, ROUTINE W REFLEX MICROSCOPIC
Bilirubin Urine: NEGATIVE
Glucose, UA: NEGATIVE mg/dL
Specific Gravity, Urine: 1.02 (ref 1.005–1.030)
Urobilinogen, UA: 0.2 mg/dL (ref 0.0–1.0)

## 2012-12-14 LAB — CBC
MCV: 88.8 fL (ref 78.0–100.0)
Platelets: 205 10*3/uL (ref 150–400)
RBC: 4.11 MIL/uL (ref 3.87–5.11)
RDW: 12.7 % (ref 11.5–15.5)
WBC: 6.2 10*3/uL (ref 4.0–10.5)

## 2012-12-14 LAB — HCG, QUANTITATIVE, PREGNANCY: hCG, Beta Chain, Quant, S: 14360 m[IU]/mL — ABNORMAL HIGH (ref <5)

## 2012-12-14 NOTE — Progress Notes (Signed)
  Subjective:    Patient ID: [name], female    DOB: [birth year], 27 y.o.   MRN: [id number]  HPI  Ms. Wynona Neat is a 27 yo separated W P2 who had a Filsche clip steriliztaion 8/12. She had a positive home pregnancy test last week and an u/s today that shows a 6 week IUP. She has already contacted an office about a termination procedure that she plans to have this Friday. She is here today because she would like to schedule a bilateral salpingectomy.  Review of Systems     Objective:   Physical Exam        Assessment & Plan:  As above

## 2012-12-14 NOTE — MAU Provider Note (Signed)
Attestation of Attending Supervision of Advanced Practitioner (CNM/NP): Evaluation and management procedures were performed by the Advanced Practitioner under my supervision and collaboration. I have reviewed the Advanced Practitioner's note and chart, and I agree with the management and plan.  Roselind Klus H. 9:49 PM

## 2012-12-14 NOTE — MAU Note (Addendum)
Had tubal ligation 20months ago.  Had 2 positive HPT yesterday.   Went to [facility]- was sent here for eval.  Skipped period in Feb, bled Mar 2-12.  Pain in back past 3 wks.

## 2012-12-14 NOTE — [facility] Provider Note (Signed)
History     CSN: [id number]  Arrival date and time: 12/14/12 0909   First Provider Initiated Contact with Patient 12/14/12 1033      Chief Complaint  Patient presents with  . Possible Pregnancy   HPI Ms. LORELEE Nichols is a 27 y.o. [phone] at [redacted]w[redacted]d who presents to [facility]U today with complaint of +HPT s/p BTL. The patient states that LMP was 11/01/12 and lasted 10 days. She has been having low back pain and right sided pelvic pain for the last few days. She has some nausea without vomiting. She denies fever. She has noticed some pink discharge when she wipes. She is having lower abdominal cramping on occasion. Her pain right now is rated at 6/10. She states occasional dizziness with change or position only. Patient had BTL with Filchie clips in August 2012.   OB History   Grav Para Term Preterm Abortions TAB SAB Ect Mult Living   4 2 0 2 1 1 0 0 0 2       Past Medical History  Diagnosis Date  . Preterm labor   . Headache   . Seizures   . Gestational diabetes   . Urinary tract infection   . Ovarian cyst   . Abnormal Pap smear   . Complication of anesthesia     N/V, passing out with epidural    Past Surgical History  Procedure Laterality Date  . Therapeutic abortion    . Repair of fr left foot    . Tubal ligation  04/21/2011    Procedure: POST PARTUM TUBAL LIGATION;  Surgeon: Tereso Newcomer, MD;  Location: [facility];  Service: Gynecology;  Laterality: Bilateral;  Bilateral post partum tubal ligation with filshie clips.    Family History  Problem Relation Age of Onset  . Cancer Paternal Grandfather     leukemia  . Hyperthyroidism Father   . Hypothyroidism Mother   . Multiple sclerosis Paternal Aunt     History  Substance Use Topics  . Smoking status: Never Smoker   . Smokeless tobacco: Never Used  . Alcohol Use: Yes     Comment: occasion    Allergies: No Known Allergies  No prescriptions prior to admission    Review of Systems  Constitutional: Negative for  fever and malaise/fatigue.  Gastrointestinal: Positive for nausea, abdominal pain and diarrhea. Negative for vomiting and constipation.  Genitourinary: Negative for dysuria, urgency and frequency.       Neg - vaginal discharge, bleeding  Neurological: Negative for dizziness and loss of consciousness.   Physical Exam   Blood pressure 128/72, pulse 84, temperature 98.3 F (36.8 C), temperature source Oral, resp. rate 18, height 5\' 5"  (1.651 m), weight 198 lb (89.812 kg), last menstrual period 11/01/2012.  Physical Exam  Constitutional: She is oriented to person, place, and time. She appears well-developed and well-nourished. No distress.  HENT:  Head: Normocephalic and atraumatic.  Cardiovascular: Normal rate.   Respiratory: Effort normal and breath sounds normal. No respiratory distress.  GI: Soft. Bowel sounds are normal. She exhibits no distension and no mass. There is tenderness (RLQ tenderness to palpation). There is no rebound and no guarding.  Genitourinary: Vagina normal. Uterus is not enlarged and not tender. Cervix exhibits no motion tenderness, no discharge and no friability. Right adnexum displays tenderness. Right adnexum displays no mass. Left adnexum displays no mass and no tenderness.  Neurological: She is alert and oriented to person, place, and time.  Skin: Skin is warm and dry. No  erythema.   Results for orders placed during the hospital encounter of 12/14/12 (from the past 24 hour(s))  URINALYSIS, ROUTINE W REFLEX MICROSCOPIC     Status: Abnormal   Collection Time    12/14/12  9:25 AM      Result Value Range   Color, Urine YELLOW  YELLOW   APPearance CLEAR  CLEAR   Specific Gravity, Urine 1.020  1.005 - 1.030   pH 6.0  5.0 - 8.0   Glucose, UA NEGATIVE  NEGATIVE mg/dL   Hgb urine dipstick MODERATE (*) NEGATIVE   Bilirubin Urine NEGATIVE  NEGATIVE   Ketones, ur NEGATIVE  NEGATIVE mg/dL   Protein, ur NEGATIVE  NEGATIVE mg/dL   Urobilinogen, UA 0.2  0.0 - 1.0 mg/dL     Nitrite NEGATIVE  NEGATIVE   Leukocytes, UA NEGATIVE  NEGATIVE  URINE MICROSCOPIC-ADD ON     Status: Abnormal   Collection Time    12/14/12  9:25 AM      Result Value Range   Squamous Epithelial / LPF FEW (*) RARE   RBC / HPF 0-2  <3 RBC/hpf   Bacteria, UA FEW (*) RARE   Urine-Other MUCOUS PRESENT    POCT PREGNANCY, URINE     Status: Abnormal   Collection Time    12/14/12  9:52 AM      Result Value Range   Preg Test, Ur POSITIVE (*) NEGATIVE  WET PREP, GENITAL     Status: Abnormal   Collection Time    12/14/12 10:40 AM      Result Value Range   Yeast Wet Prep HPF POC NONE SEEN  NONE SEEN   Trich, Wet Prep NONE SEEN  NONE SEEN   Clue Cells Wet Prep HPF POC NONE SEEN  NONE SEEN   WBC, Wet Prep HPF POC MANY (*) NONE SEEN  CBC     Status: None   Collection Time    12/14/12 10:45 AM      Result Value Range   WBC 6.2  4.0 - 10.5 K/uL   RBC 4.11  3.87 - 5.11 MIL/uL   Hemoglobin 12.9  12.0 - 15.0 g/dL   HCT 19.1  47.8 - 29.5 %   MCV 88.8  78.0 - 100.0 fL   MCH 31.4  26.0 - 34.0 pg   MCHC 35.3  30.0 - 36.0 g/dL   RDW 62.1  30.8 - 65.7 %   Platelets 205  150 - 400 K/uL  HCG, QUANTITATIVE, PREGNANCY     Status: Abnormal   Collection Time    12/14/12 10:45 AM      Result Value Range   hCG, Beta Chain, Quant, S 14360 (*) <5 mIU/mL    MAU Course  Procedures None  MDM Wet prep, GC/Chlamydia, CBC, quant hCG and [place] today Patient may want to terminate, not a desired pregnancy. Patient is visibly upset. Assessment and Plan  A: IUP at 5w 3d with cardiac activity  P: Discharge home Patient advised to follow-up with [facility] either for prenatal care or after TAB for birth control management Patient may return to [facility]U as needed or if her condition were to change or worsen  Freddi Starr, PA-C  12/14/2012, 1:37 PM

## 2012-12-14 NOTE — MAU Note (Signed)
Name DOB verified, pt confirms spelling is correct on armband.  Explained plan of care to pt.

## 2012-12-15 LAB — GC/CHLAMYDIA PROBE AMP
CT Probe RNA: NEGATIVE
GC Probe RNA: NEGATIVE

## 2012-12-17 ENCOUNTER — Encounter: Payer: Self-pay | Admitting: Obstetrics & Gynecology

## 2013-01-12 ENCOUNTER — Telehealth: Payer: Self-pay | Admitting: *Deleted

## 2013-01-12 DIAGNOSIS — B379 Candidiasis, unspecified: Secondary | ICD-10-CM

## 2013-01-12 MED ORDER — FLUCONAZOLE 150 MG PO TABS: 150.0000 mg | ORAL_TABLET | Freq: Once | ORAL | Status: DC

## 2013-01-12 NOTE — Telephone Encounter (Signed)
Patient is itchy and irritated with clumpy white discharge.  She would like something called in for a yeast infection.

## 2013-01-18 ENCOUNTER — Ambulatory Visit (HOSPITAL_COMMUNITY)
Admission: RE | Admit: 2013-01-18 | Payer: Managed Care, Other (non HMO) | Source: Ambulatory Visit | Admitting: Obstetrics & Gynecology

## 2013-01-18 ENCOUNTER — Encounter (HOSPITAL_COMMUNITY): Admission: RE | Payer: Self-pay | Source: Ambulatory Visit

## 2013-01-18 SURGERY — LAPAROSCOPY OPERATIVE
Anesthesia: General

## 2013-02-15 ENCOUNTER — Telehealth: Payer: Self-pay | Admitting: *Deleted

## 2013-02-15 DIAGNOSIS — B379 Candidiasis, unspecified: Secondary | ICD-10-CM

## 2013-02-15 MED ORDER — FLUCONAZOLE 150 MG PO TABS: 150.0000 mg | ORAL_TABLET | Freq: Once | ORAL | Status: DC

## 2013-02-15 NOTE — Telephone Encounter (Signed)
Patient has been getting a yeast infection prior to her period the last few cycles.  She has a thick white discharge with a lot of itching.  She would like [place] to call in Diflucan for her.

## 2013-03-23 ENCOUNTER — Emergency Department: Payer: Self-pay | Admitting: Emergency Medicine

## 2013-03-23 LAB — CBC
HCT: 35.1 % (ref 35.0–47.0)
MCH: 31.6 pg (ref 26.0–34.0)
MCHC: 35.6 g/dL (ref 32.0–36.0)
MCV: 89 fL (ref 80–100)

## 2013-03-23 LAB — COMPREHENSIVE METABOLIC PANEL
Anion Gap: 5 — ABNORMAL LOW (ref 7–16)
BUN: 13 mg/dL (ref 7–18)
Bilirubin,Total: 0.4 mg/dL (ref 0.2–1.0)
Co2: 26 mmol/L (ref 21–32)
Osmolality: 275 (ref 275–301)
SGOT(AST): 13 U/L — ABNORMAL LOW (ref 15–37)
SGPT (ALT): 18 U/L (ref 12–78)
Sodium: 137 mmol/L (ref 136–145)

## 2013-06-17 ENCOUNTER — Encounter: Payer: Self-pay | Admitting: Obstetrics and Gynecology

## 2013-06-17 ENCOUNTER — Ambulatory Visit (INDEPENDENT_AMBULATORY_CARE_PROVIDER_SITE_OTHER): Payer: Managed Care, Other (non HMO) | Admitting: Obstetrics and Gynecology

## 2013-06-17 VITALS — BP 124/69 | HR 83 | Ht 67.0 in | Wt 191.0 lb

## 2013-06-17 DIAGNOSIS — Z124 Encounter for screening for malignant neoplasm of cervix: Secondary | ICD-10-CM

## 2013-06-17 DIAGNOSIS — Z01419 Encounter for gynecological examination (general) (routine) without abnormal findings: Secondary | ICD-10-CM

## 2013-06-17 NOTE — Patient Instructions (Signed)
Preventive Care for Adults, Female A healthy lifestyle and preventive care can promote health and wellness. Preventive health guidelines for women include the following key practices.  A routine yearly physical is a good way to check with your caregiver about your health and preventive screening. It is a chance to share any concerns and updates on your health, and to receive a thorough exam.  Visit your dentist for a routine exam and preventive care every 6 months. Brush your teeth twice a day and floss once a day. Good oral hygiene prevents tooth decay and gum disease.  The frequency of eye exams is based on your age, health, family medical history, use of contact lenses, and other factors. Follow your caregiver's recommendations for frequency of eye exams.  Eat a healthy diet. Foods like vegetables, fruits, whole grains, low-fat dairy products, and lean protein foods contain the nutrients you need without too many calories. Decrease your intake of foods high in solid fats, added sugars, and salt. Eat the right amount of calories for you.Get information about a proper diet from your caregiver, if necessary.  Regular physical exercise is one of the most important things you can do for your health. Most adults should get at least 150 minutes of moderate-intensity exercise (any activity that increases your heart rate and causes you to sweat) each week. In addition, most adults need muscle-strengthening exercises on 2 or more days a week.  Maintain a healthy weight. The body mass index (BMI) is a screening tool to identify possible weight problems. It provides an estimate of body fat based on height and weight. Your caregiver can help determine your BMI, and can help you achieve or maintain a healthy weight.For adults 20 years and older:  A BMI below 18.5 is considered underweight.  A BMI of 18.5 to 24.9 is normal.  A BMI of 25 to 29.9 is considered overweight.  A BMI of 30 and above is  considered obese.  Maintain normal blood lipids and cholesterol levels by exercising and minimizing your intake of saturated fat. Eat a balanced diet with plenty of fruit and vegetables. Blood tests for lipids and cholesterol should begin at age 20 and be repeated every 5 years. If your lipid or cholesterol levels are high, you are over 50, or you are at high risk for heart disease, you may need your cholesterol levels checked more frequently.Ongoing high lipid and cholesterol levels should be treated with medicines if diet and exercise are not effective.  If you smoke, find out from your caregiver how to quit. If you do not use tobacco, do not start.  If you are pregnant, do not drink alcohol. If you are breastfeeding, be very cautious about drinking alcohol. If you are not pregnant and choose to drink alcohol, do not exceed 1 drink per day. One drink is considered to be 12 ounces (355 mL) of beer, 5 ounces (148 mL) of wine, or 1.5 ounces (44 mL) of liquor.  Avoid use of street drugs. Do not share needles with anyone. Ask for help if you need support or instructions about stopping the use of drugs.  High blood pressure causes heart disease and increases the risk of stroke. Your blood pressure should be checked at least every 1 to 2 years. Ongoing high blood pressure should be treated with medicines if weight loss and exercise are not effective.  If you are 55 to 27 years old, ask your caregiver if you should take aspirin to prevent strokes.  Diabetes   screening involves taking a blood sample to check your fasting blood sugar level. This should be done once every 3 years, after age 45, if you are within normal weight and without risk factors for diabetes. Testing should be considered at a younger age or be carried out more frequently if you are overweight and have at least 1 risk factor for diabetes.  Breast cancer screening is essential preventive care for women. You should practice "breast  self-awareness." This means understanding the normal appearance and feel of your breasts and may include breast self-examination. Any changes detected, no matter how small, should be reported to a caregiver. Women in their 20s and 30s should have a clinical breast exam (CBE) by a caregiver as part of a regular health exam every 1 to 3 years. After age 40, women should have a CBE every year. Starting at age 40, women should consider having a mammography (breast X-ray test) every year. Women who have a family history of breast cancer should talk to their caregiver about genetic screening. Women at a high risk of breast cancer should talk to their caregivers about having magnetic resonance imaging (MRI) and a mammography every year.  The Pap test is a screening test for cervical cancer. A Pap test can show cell changes on the cervix that might become cervical cancer if left untreated. A Pap test is a procedure in which cells are obtained and examined from the lower end of the uterus (cervix).  Women should have a Pap test starting at age 21.  Between ages 21 and 29, Pap tests should be repeated every 2 years.  Beginning at age 30, you should have a Pap test every 3 years as long as the past 3 Pap tests have been normal.  Some women have medical problems that increase the chance of getting cervical cancer. Talk to your caregiver about these problems. It is especially important to talk to your caregiver if a new problem develops soon after your last Pap test. In these cases, your caregiver may recommend more frequent screening and Pap tests.  The above recommendations are the same for women who have or have not gotten the vaccine for human papillomavirus (HPV).  If you had a hysterectomy for a problem that was not cancer or a condition that could lead to cancer, then you no longer need Pap tests. Even if you no longer need a Pap test, a regular exam is a good idea to make sure no other problems are  starting.  If you are between ages 65 and 70, and you have had normal Pap tests going back 10 years, you no longer need Pap tests. Even if you no longer need a Pap test, a regular exam is a good idea to make sure no other problems are starting.  If you have had past treatment for cervical cancer or a condition that could lead to cancer, you need Pap tests and screening for cancer for at least 20 years after your treatment.  If Pap tests have been discontinued, risk factors (such as a new sexual partner) need to be reassessed to determine if screening should be resumed.  The HPV test is an additional test that may be used for cervical cancer screening. The HPV test looks for the virus that can cause the cell changes on the cervix. The cells collected during the Pap test can be tested for HPV. The HPV test could be used to screen women aged 30 years and older, and should   be used in women of any age who have unclear Pap test results. After the age of 30, women should have HPV testing at the same frequency as a Pap test.  Colorectal cancer can be detected and often prevented. Most routine colorectal cancer screening begins at the age of 50 and continues through age 75. However, your caregiver may recommend screening at an earlier age if you have risk factors for colon cancer. On a yearly basis, your caregiver may provide home test kits to check for hidden blood in the stool. Use of a small camera at the end of a tube, to directly examine the colon (sigmoidoscopy or colonoscopy), can detect the earliest forms of colorectal cancer. Talk to your caregiver about this at age 50, when routine screening begins. Direct examination of the colon should be repeated every 5 to 10 years through age 75, unless early forms of pre-cancerous polyps or small growths are found.  Hepatitis C blood testing is recommended for all people born from 1945 through 1965 and any individual with known risks for hepatitis C.  Practice  safe sex. Use condoms and avoid high-risk sexual practices to reduce the spread of sexually transmitted infections (STIs). STIs include gonorrhea, chlamydia, syphilis, trichomonas, herpes, HPV, and human immunodeficiency virus (HIV). Herpes, HIV, and HPV are viral illnesses that have no cure. They can result in disability, cancer, and death. Sexually active women aged 25 and younger should be checked for chlamydia. Older women with new or multiple partners should also be tested for chlamydia. Testing for other STIs is recommended if you are sexually active and at increased risk.  Osteoporosis is a disease in which the bones lose minerals and strength with aging. This can result in serious bone fractures. The risk of osteoporosis can be identified using a bone density scan. Women ages 65 and over and women at risk for fractures or osteoporosis should discuss screening with their caregivers. Ask your caregiver whether you should take a calcium supplement or vitamin D to reduce the rate of osteoporosis.  Menopause can be associated with physical symptoms and risks. Hormone replacement therapy is available to decrease symptoms and risks. You should talk to your caregiver about whether hormone replacement therapy is right for you.  Use sunscreen with sun protection factor (SPF) of 30 or more. Apply sunscreen liberally and repeatedly throughout the day. You should seek shade when your shadow is shorter than you. Protect yourself by wearing long sleeves, pants, a wide-brimmed hat, and sunglasses year round, whenever you are outdoors.  Once a month, do a whole body skin exam, using a mirror to look at the skin on your back. Notify your caregiver of new moles, moles that have irregular borders, moles that are larger than a pencil eraser, or moles that have changed in shape or color.  Stay current with required immunizations.  Influenza. You need a dose every fall (or winter). The composition of the flu vaccine  changes each year, so being vaccinated once is not enough.  Pneumococcal polysaccharide. You need 1 to 2 doses if you smoke cigarettes or if you have certain chronic medical conditions. You need 1 dose at age 65 (or older) if you have never been vaccinated.  Tetanus, diphtheria, pertussis (Tdap, Td). Get 1 dose of Tdap vaccine if you are younger than age 65, are over 65 and have contact with an infant, are a healthcare worker, are pregnant, or simply want to be protected from whooping cough. After that, you need a Td   booster dose every 10 years. Consult your caregiver if you have not had at least 3 tetanus and diphtheria-containing shots sometime in your life or have a deep or dirty wound.  HPV. You need this vaccine if you are a woman age 26 or younger. The vaccine is given in 3 doses over 6 months.  Measles, mumps, rubella (MMR). You need at least 1 dose of MMR if you were born in 1957 or later. You may also need a second dose.  Meningococcal. If you are age 19 to 21 and a first-year college student living in a residence hall, or have one of several medical conditions, you need to get vaccinated against meningococcal disease. You may also need additional booster doses.  Zoster (shingles). If you are age 60 or older, you should get this vaccine.  Varicella (chickenpox). If you have never had chickenpox or you were vaccinated but received only 1 dose, talk to your caregiver to find out if you need this vaccine.  Hepatitis A. You need this vaccine if you have a specific risk factor for hepatitis A virus infection or you simply wish to be protected from this disease. The vaccine is usually given as 2 doses, 6 to 18 months apart.  Hepatitis B. You need this vaccine if you have a specific risk factor for hepatitis B virus infection or you simply wish to be protected from this disease. The vaccine is given in 3 doses, usually over 6 months. Preventive Services / Frequency Ages 19 to 39  Blood  pressure check.** / Every 1 to 2 years.  Lipid and cholesterol check.** / Every 5 years beginning at age 20.  Clinical breast exam.** / Every 3 years for women in their 20s and 30s.  Pap test.** / Every 2 years from ages 21 through 29. Every 3 years starting at age 30 through age 65 or 70 with a history of 3 consecutive normal Pap tests.  HPV screening.** / Every 3 years from ages 30 through ages 65 to 70 with a history of 3 consecutive normal Pap tests.  Hepatitis C blood test.** / For any individual with known risks for hepatitis C.  Skin self-exam. / Monthly.  Influenza immunization.** / Every year.  Pneumococcal polysaccharide immunization.** / 1 to 2 doses if you smoke cigarettes or if you have certain chronic medical conditions.  Tetanus, diphtheria, pertussis (Tdap, Td) immunization. / A one-time dose of Tdap vaccine. After that, you need a Td booster dose every 10 years.  HPV immunization. / 3 doses over 6 months, if you are 26 and younger.  Measles, mumps, rubella (MMR) immunization. / You need at least 1 dose of MMR if you were born in 1957 or later. You may also need a second dose.  Meningococcal immunization. / 1 dose if you are age 19 to 21 and a first-year college student living in a residence hall, or have one of several medical conditions, you need to get vaccinated against meningococcal disease. You may also need additional booster doses.  Varicella immunization.** / Consult your caregiver.  Hepatitis A immunization.** / Consult your caregiver. 2 doses, 6 to 18 months apart.  Hepatitis B immunization.** / Consult your caregiver. 3 doses usually over 6 months. Ages 40 to 64  Blood pressure check.** / Every 1 to 2 years.  Lipid and cholesterol check.** / Every 5 years beginning at age 20.  Clinical breast exam.** / Every year after age 40.  Mammogram.** / Every year beginning at age 40   and continuing for as long as you are in good health. Consult with your  caregiver.  Pap test.** / Every 3 years starting at age 30 through age 65 or 70 with a history of 3 consecutive normal Pap tests.  HPV screening.** / Every 3 years from ages 30 through ages 65 to 70 with a history of 3 consecutive normal Pap tests.  Fecal occult blood test (FOBT) of stool. / Every year beginning at age 50 and continuing until age 75. You may not need to do this test if you get a colonoscopy every 10 years.  Flexible sigmoidoscopy or colonoscopy.** / Every 5 years for a flexible sigmoidoscopy or every 10 years for a colonoscopy beginning at age 50 and continuing until age 75.  Hepatitis C blood test.** / For all people born from 1945 through 1965 and any individual with known risks for hepatitis C.  Skin self-exam. / Monthly.  Influenza immunization.** / Every year.  Pneumococcal polysaccharide immunization.** / 1 to 2 doses if you smoke cigarettes or if you have certain chronic medical conditions.  Tetanus, diphtheria, pertussis (Tdap, Td) immunization.** / A one-time dose of Tdap vaccine. After that, you need a Td booster dose every 10 years.  Measles, mumps, rubella (MMR) immunization. / You need at least 1 dose of MMR if you were born in 1957 or later. You may also need a second dose.  Varicella immunization.** / Consult your caregiver.  Meningococcal immunization.** / Consult your caregiver.  Hepatitis A immunization.** / Consult your caregiver. 2 doses, 6 to 18 months apart.  Hepatitis B immunization.** / Consult your caregiver. 3 doses, usually over 6 months. Ages 65 and over  Blood pressure check.** / Every 1 to 2 years.  Lipid and cholesterol check.** / Every 5 years beginning at age 20.  Clinical breast exam.** / Every year after age 40.  Mammogram.** / Every year beginning at age 40 and continuing for as long as you are in good health. Consult with your caregiver.  Pap test.** / Every 3 years starting at age 30 through age 65 or 70 with a 3  consecutive normal Pap tests. Testing can be stopped between 65 and 70 with 3 consecutive normal Pap tests and no abnormal Pap or HPV tests in the past 10 years.  HPV screening.** / Every 3 years from ages 30 through ages 65 or 70 with a history of 3 consecutive normal Pap tests. Testing can be stopped between 65 and 70 with 3 consecutive normal Pap tests and no abnormal Pap or HPV tests in the past 10 years.  Fecal occult blood test (FOBT) of stool. / Every year beginning at age 50 and continuing until age 75. You may not need to do this test if you get a colonoscopy every 10 years.  Flexible sigmoidoscopy or colonoscopy.** / Every 5 years for a flexible sigmoidoscopy or every 10 years for a colonoscopy beginning at age 50 and continuing until age 75.  Hepatitis C blood test.** / For all people born from 1945 through 1965 and any individual with known risks for hepatitis C.  Osteoporosis screening.** / A one-time screening for women ages 65 and over and women at risk for fractures or osteoporosis.  Skin self-exam. / Monthly.  Influenza immunization.** / Every year.  Pneumococcal polysaccharide immunization.** / 1 dose at age 65 (or older) if you have never been vaccinated.  Tetanus, diphtheria, pertussis (Tdap, Td) immunization. / A one-time dose of Tdap vaccine if you are over   65 and have contact with an infant, are a healthcare worker, or simply want to be protected from whooping cough. After that, you need a Td booster dose every 10 years.  Varicella immunization.** / Consult your caregiver.  Meningococcal immunization.** / Consult your caregiver.  Hepatitis A immunization.** / Consult your caregiver. 2 doses, 6 to 18 months apart.  Hepatitis B immunization.** / Check with your caregiver. 3 doses, usually over 6 months. ** Family history and personal history of risk and conditions may change your caregiver's recommendations. Document Released: 10/15/2001 Document Revised: 11/11/2011  Document Reviewed: 01/14/2011 [employer] Patient Information 2014 [employer].  

## 2013-06-17 NOTE — Progress Notes (Signed)
  Subjective:     [name] is a 27 y.o. female [phone] with LMP 05/21/2013 and BMI 29 who is here for a comprehensive physical exam. The patient reports no problems. Patient sexually active using BTL for contraception. Patient had a failed BTL in 04/2011 and had salpingectomy performed in 01/2013 in a different hospital  History   Social History  . Marital Status: Married    Spouse Name: N/A    Number of Children: N/A  . Years of Education: N/A   Occupational History  . Not on file.   Social History Main Topics  . Smoking status: Never Smoker   . Smokeless tobacco: Never Used  . Alcohol Use: Yes     Comment: occasion  . Drug Use: No  . Sexual Activity: Yes    Partners: Male    Birth Control/ Protection: Surgical     Comment: tubaligation.   Other Topics Concern  . Not on file   Social History Narrative  . No narrative on file   Health Maintenance  Topic Date Due  . Influenza Vaccine  04/02/2013  . Pap Smear  05/30/2015  . Tetanus/tdap  04/21/2021       Review of Systems A comprehensive review of systems was negative.   Objective:      GENERAL: Well-developed, well-nourished female in no acute distress.  HEENT: Normocephalic, atraumatic. Sclerae anicteric.  NECK: Supple. Normal thyroid.  LUNGS: Clear to auscultation bilaterally.  HEART: Regular rate and rhythm. BREASTS: Symmetric in size. No palpable masses or lymphadenopathy, skin changes, or nipple drainage. ABDOMEN: Soft, nontender, nondistended. No organomegaly. PELVIC: Normal external female genitalia. Vagina is pink and rugated.  Normal discharge. Normal appearing cervix. Uterus is normal in size. No adnexal mass or tenderness. EXTREMITIES: No cyanosis, clubbing, or edema, 2+ distal pulses.    Assessment:    Healthy female exam.      Plan:    Pap smear collected Patient advised to perform monthly self breast exams Patient will be contacted with any abnormal results See After Visit Summary  for Counseling Recommendations

## 2013-07-08 ENCOUNTER — Other Ambulatory Visit: Payer: Self-pay

## 2013-07-16 ENCOUNTER — Telehealth: Payer: Self-pay | Admitting: *Deleted

## 2013-07-16 DIAGNOSIS — B379 Candidiasis, unspecified: Secondary | ICD-10-CM

## 2013-07-16 MED ORDER — FLUCONAZOLE 150 MG PO TABS: 150.0000 mg | ORAL_TABLET | Freq: Every day | ORAL | Status: DC

## 2013-07-16 NOTE — Telephone Encounter (Signed)
Patient has a yeast infection and would like a Diflucan called in.

## 2013-09-20 ENCOUNTER — Encounter: Payer: Self-pay | Admitting: Family Medicine

## 2013-09-20 ENCOUNTER — Ambulatory Visit (INDEPENDENT_AMBULATORY_CARE_PROVIDER_SITE_OTHER): Payer: Managed Care, Other (non HMO) | Admitting: Family Medicine

## 2013-09-20 VITALS — BP 120/76 | HR 93 | Ht 67.0 in | Wt 191.5 lb

## 2013-09-20 DIAGNOSIS — Z01812 Encounter for preprocedural laboratory examination: Secondary | ICD-10-CM

## 2013-09-20 DIAGNOSIS — R87619 Unspecified abnormal cytological findings in specimens from cervix uteri: Secondary | ICD-10-CM

## 2013-09-20 DIAGNOSIS — N899 Noninflammatory disorder of vagina, unspecified: Secondary | ICD-10-CM

## 2013-09-20 DIAGNOSIS — N898 Other specified noninflammatory disorders of vagina: Secondary | ICD-10-CM

## 2013-09-20 LAB — WET PREP, GENITAL
Trich, Wet Prep: NONE SEEN
Yeast Wet Prep HPF POC: NONE SEEN

## 2013-09-20 LAB — POCT PREGNANCY, URINE: PREG TEST UR: NEGATIVE

## 2013-09-20 NOTE — Progress Notes (Signed)
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 No HPV Colpo by ASCCP guidelines Colposcopy Procedure Note  Procedure: Colposcopy Consent: The risks and benefits of the procedure were discussed with the patient. Risks include acute and chronic abdominal and/or pelvic pain, infection (minimized by sterile technique), bleeding (internal and external), and need for subsequent procedure to correct a complication or further treat symptoms. Rarely, damage to internal structures may occur requiring subsequent procedures or, exceedingly rare, repair or removal of the uterus. Benefits may include further evaluation and diagnosis of cervical pathology. The alternatives were explained to the patient including: not doing procedure or postponing procedure. The disadvantages to not doing the procedure were discussed with the patent, including missed diagnosis of cervical cancer The written consent form has been signed and placed in the patient's medical record The patient voiced understanding of the procedure and agreed to proceed. Indication: Cervical dysplasia work up per ASCCP Guidelines Physicians: Dr. Jolyn LentMichael Kavish Lafitte and Dr. Sharmon Revereonstan tDescription in detail:   Chaperone present for exam Lyla SonCarrie A timeout was completed before the start of the procedure - site verified and documented in the chart.  External genitalia w/o visible lesions. Speculum exam revealed normal appearing cervix.  The cervix was examined using high powered microscopy  Acetic acid applied to cervix - vascularity at 3pm, and hyperproliferation at [facility]9ocklock and 6oclock with green filter  The entire transformation zone / squamocolumnar junction was visualized.  Biopsies completed - 3, 6, 9  ECC completed EBL: Minimal, <243mL; hemostasis achieved prior to removing speculum Complications: None; Pt. tolerated procedure well. Instructions to patient: Return to clinic vs. ER, depending on severity, with fevers,   increasing pain, or difficulty walking. Expect mild  soreness and spotting for one day.  Call clinic with other questions. Post-procedure handout given to patient. Nothing in vagina for two weeks. Follow up plan: LSIL on PAP, now s/p colposcopy. Will call pt with results and follow ASCCP guidelines. NOTE: Bx and ECC pending

## 2013-09-21 ENCOUNTER — Other Ambulatory Visit (HOSPITAL_COMMUNITY)
Admission: RE | Admit: 2013-09-21 | Discharge: 2013-09-21 | Disposition: A | Discharge status: Home / Self Care | Payer: Managed Care, Other (non HMO) | Source: Ambulatory Visit | Attending: Family Medicine | Admitting: Family Medicine

## 2013-09-21 DIAGNOSIS — N879 Dysplasia of cervix uteri, unspecified: Secondary | ICD-10-CM | POA: Insufficient documentation

## 2013-09-21 DIAGNOSIS — N72 Inflammatory disease of cervix uteri: Secondary | ICD-10-CM | POA: Insufficient documentation

## 2013-09-21 LAB — GC/CHLAMYDIA PROBE AMP
CT Probe RNA: NEGATIVE
GC PROBE AMP APTIMA: NEGATIVE

## 2013-09-21 NOTE — Addendum Note (Signed)
Addended by: Kathee DeltonHILLMAN, Randall Rampersad L on: 09/21/2013 09:11 AM   Modules accepted: Orders

## 2013-09-21 NOTE — Addendum Note (Signed)
Addended by: Kathee DeltonHILLMAN, CARRIE L on: 09/21/2013 09:07 AM   Modules accepted: Orders

## 2013-09-22 ENCOUNTER — Encounter: Payer: Self-pay | Admitting: *Deleted

## 2013-09-29 ENCOUNTER — Telehealth: Payer: Self-pay | Admitting: General Practice

## 2013-09-29 ENCOUNTER — Encounter: Payer: Self-pay | Admitting: Family Medicine

## 2013-09-29 DIAGNOSIS — N760 Acute vaginitis: Principal | ICD-10-CM

## 2013-09-29 DIAGNOSIS — B9689 Other specified bacterial agents as the cause of diseases classified elsewhere: Secondary | ICD-10-CM

## 2013-09-29 DIAGNOSIS — R87612 Low grade squamous intraepithelial lesion on cytologic smear of cervix (LGSIL): Secondary | ICD-10-CM | POA: Insufficient documentation

## 2013-09-29 MED ORDER — METRONIDAZOLE 500 MG PO TABS: 500.0000 mg | ORAL_TABLET | Freq: Two times a day (BID) | ORAL | Status: DC

## 2013-09-29 NOTE — Telephone Encounter (Signed)
Message copied by Kathee DeltonHILLMAN, Robecca Fulgham L on Wed Sep 29, 2013  3:39 PM ------      Message from: Jolyn LentDOM, MICHAEL R      Created: Wed Sep 29, 2013  3:26 PM       Please call and inform no evidence of malignancy colpo and needs a repeat pap smear in 12 month with cotesting ------

## 2013-09-29 NOTE — Telephone Encounter (Signed)
Called patient and informed her of results & recommendations. Patient verbalized understanding and had no further questions

## 2013-09-29 NOTE — Telephone Encounter (Signed)
Per chart review patient had many clue cells on wet prep. Called patient to see if she was having d/c. No answer- left message that we are trying to get in touch with you, please call us back.

## 2013-09-29 NOTE — Telephone Encounter (Signed)
Patient called back in to front office and I asked patient if she was having any abnormal d/c. Patient stated that it had a slight odor to it and was discolored and yucky looking. Told patient her wet prep did come back positive BV so we would be sending in a Rx to treat that to her [facility] pharmacy and to avoid alcohol while on the medication. Patient verbalized understanding and had no further questions

## 2013-11-09 ENCOUNTER — Telehealth: Payer: Self-pay

## 2013-11-09 NOTE — Telephone Encounter (Signed)
Patient called has a yeast infections, symptoms are white clumpy discharge and itchy. Called in Dyflucan 150 #2 0 refills to [facility] spoke with Rosanne AshingJim!

## 2013-11-16 ENCOUNTER — Other Ambulatory Visit: Payer: Self-pay | Admitting: *Deleted

## 2013-11-16 DIAGNOSIS — B9689 Other specified bacterial agents as the cause of diseases classified elsewhere: Secondary | ICD-10-CM

## 2013-11-16 DIAGNOSIS — N760 Acute vaginitis: Principal | ICD-10-CM

## 2013-11-16 MED ORDER — METRONIDAZOLE 500 MG PO TABS: 500.0000 mg | ORAL_TABLET | Freq: Two times a day (BID) | ORAL | Status: DC

## 2013-11-16 NOTE — Telephone Encounter (Signed)
Pt calling and is having symptoms of bacterial vaginosis.  I have sent in Flagyl to patient pharmacy.  Pt aware.

## 2014-04-29 ENCOUNTER — Telehealth: Payer: Self-pay | Admitting: *Deleted

## 2014-04-29 DIAGNOSIS — B9689 Other specified bacterial agents as the cause of diseases classified elsewhere: Secondary | ICD-10-CM

## 2014-04-29 DIAGNOSIS — N760 Acute vaginitis: Principal | ICD-10-CM

## 2014-04-29 MED ORDER — METRONIDAZOLE 500 MG PO TABS: 500.0000 mg | ORAL_TABLET | Freq: Two times a day (BID) | ORAL | Status: DC

## 2014-04-29 NOTE — Telephone Encounter (Signed)
Message copied by Barbara Cower on Fri Apr 29, 2014 11:44 AM ------      Message from: Pennie Banter      Created: Fri Apr 29, 2014 11:11 AM      Regarding: rx request       [name] has white discharge with odor and wanted to know if we could call in something for bacterial vaginosis.  She uses [facility].        ------

## 2014-07-04 ENCOUNTER — Encounter: Payer: Self-pay | Admitting: Family Medicine

## 2014-09-05 ENCOUNTER — Telehealth: Payer: Self-pay | Admitting: *Deleted

## 2014-09-05 DIAGNOSIS — B9689 Other specified bacterial agents as the cause of diseases classified elsewhere: Secondary | ICD-10-CM

## 2014-09-05 DIAGNOSIS — N760 Acute vaginitis: Principal | ICD-10-CM

## 2014-09-05 MED ORDER — METRONIDAZOLE 500 MG PO TABS: 500.0000 mg | ORAL_TABLET | Freq: Two times a day (BID) | ORAL | Status: DC

## 2014-09-05 NOTE — Telephone Encounter (Signed)
Patient called and is having symptoms of bacterial vaginosis.  I have sent in Flagyl to patients pharmacy.

## 2014-09-30 ENCOUNTER — Ambulatory Visit: Payer: Managed Care, Other (non HMO) | Admitting: Family Medicine

## 2014-10-07 ENCOUNTER — Ambulatory Visit: Payer: Managed Care, Other (non HMO) | Admitting: Family Medicine

## 2014-10-07 DIAGNOSIS — Z01419 Encounter for gynecological examination (general) (routine) without abnormal findings: Secondary | ICD-10-CM

## 2014-10-13 ENCOUNTER — Ambulatory Visit: Payer: Managed Care, Other (non HMO) | Admitting: Internal Medicine

## 2015-03-03 ENCOUNTER — Ambulatory Visit: Payer: Self-pay | Admitting: Family Medicine

## 2015-03-29 ENCOUNTER — Ambulatory Visit (INDEPENDENT_AMBULATORY_CARE_PROVIDER_SITE_OTHER): Payer: Managed Care, Other (non HMO) | Admitting: Family Medicine

## 2015-03-29 ENCOUNTER — Encounter: Payer: Self-pay | Admitting: Family Medicine

## 2015-03-29 VITALS — BP 121/84 | HR 69 | Temp 98.2°F | Wt 201.0 lb

## 2015-03-29 DIAGNOSIS — R5382 Chronic fatigue, unspecified: Secondary | ICD-10-CM | POA: Diagnosis not present

## 2015-03-29 DIAGNOSIS — R87612 Low grade squamous intraepithelial lesion on cytologic smear of cervix (LGSIL): Secondary | ICD-10-CM

## 2015-03-29 DIAGNOSIS — R5383 Other fatigue: Secondary | ICD-10-CM | POA: Insufficient documentation

## 2015-03-29 DIAGNOSIS — N921 Excessive and frequent menstruation with irregular cycle: Secondary | ICD-10-CM

## 2015-03-29 DIAGNOSIS — K921 Melena: Secondary | ICD-10-CM

## 2015-03-29 DIAGNOSIS — N920 Excessive and frequent menstruation with regular cycle: Secondary | ICD-10-CM | POA: Insufficient documentation

## 2015-03-29 NOTE — Assessment & Plan Note (Signed)
Will check labs and have her call back to gynecologist for the birth control pill and follow-up; may need ultrasound; check thyroid labs and ferritin and CBC to make sure she' s not anemic

## 2015-03-29 NOTE — Progress Notes (Signed)
BP 121/84 mmHg  Pulse 69  Temp(Src) 98.2 F (36.8 C)  Wt 201 lb (91.173 kg)  SpO2 100%  LMP 03/14/2015 (Approximate)   Subjective:    Patient ID: [name], female    DOB: 1985-10-23, 29 y.o.   MRN: [id number]  HPI: [name] is a 29 y.o. female  Chief Complaint  Patient presents with  . Menorrhagia   The patient gives a very confusing and circuitous history regarding her irregular menstrual bleeding... She says that her periods have been long and irregular ever since she stopped the birth control pills that I put her on; I explained no one from here has prescribed birth control pills for since 2005 so I have no idea what she is talking about; I asked how long she's been taking birth control pill; she says her had her tubes "cut out" when he daughter was born; she is 62 years old now; then she got pregnant, then lost that baby, then went back to the doctor and then took tubes out and periods were crazy after that; since her "periods were all crazy" after that,  they put her on OCPs We clarified that she has been seeing gynecology, then, for OCPs and I asked why she hadn't called her gynecologist about this She says she tried but can't get an appointment to see her gynecologist;[facility]ons at [facility]  Getting back to her original problem, I asked when and why she stopped the birth control pills that led to her current complaint and visit reason She stopped the birth control pills about 4 months ago on her own and then periods have been "whacked out" since then She was able to tell me her days of bleeding since stopping the pills: March 29th to April 10th April 16th to April 29th May 13th to May 26th June 2nd to June 16th June 25th to July 9th July 14th and still bleeding today Menstrual periods are very heavy and has to wear a pad and super tampon She does change them often enough, aware of TSS risk No fevers, no weight loss She she is not sure if she  should start taking birth control pills again or not; then said that she is not sure if maybe she is going through menopause  Relevant past medical, surgical, family and social history reviewed and updated as indicated. Interim medical history since our last visit reviewed.  Her mother had full hysterectomy; used to bleed really really bad Father has thyroid disease; takes pills every day No colon cancer in the family  Allergies and medications reviewed and updated.  Review of Systems  Constitutional: Positive for fatigue. Negative for fever and unexpected weight change.  HENT: Positive for nosebleeds.   Gastrointestinal: Positive for abdominal pain (before having BM, then "I'm fine" after having BM), blood in stool (blood in her "poop"; little bitty "turd" with bunch of blood around it) and rectal pain (sometimes hurts to go, not all the time; comes and goes; never paid it attention). Negative for diarrhea and constipation.  Genitourinary: Positive for vaginal bleeding and menstrual problem. Negative for hematuria.  Neurological: Negative for tremors and weakness.  Hematological: Does not bruise/bleed easily.   Per HPI unless specifically indicated above     Objective:    BP 121/84 mmHg  Pulse 69  Temp(Src) 98.2 F (36.8 C)  Wt 201 lb (91.173 kg)  SpO2 100%  LMP 03/14/2015 (Approximate)  Wt Readings from Last 3 Encounters:  03/29/15 201  lb (91.173 kg)  07/22/14 197 lb (89.359 kg)  09/20/13 191 lb 8 oz (86.864 kg)    Physical Exam  Constitutional: She appears well-developed and well-nourished. No distress.  HENT:  Head: Normocephalic and atraumatic.  Eyes: EOM are normal. No scleral icterus.  Neck: No thyromegaly present.  Cardiovascular: Normal rate, regular rhythm and normal heart sounds.   No murmur heard. Pulmonary/Chest: Effort normal and breath sounds normal. No respiratory distress. She has no wheezes.  Abdominal: Soft. Bowel sounds are normal. She exhibits no  distension.  Rectal exam declined by patient; she'll have gyn examine her when she goes she says  Musculoskeletal: Normal range of motion.  Neurological: She is alert. She exhibits normal muscle tone.  Skin: Skin is warm and dry. She is not diaphoretic. No pallor.  Psychiatric: Her speech is normal. Her mood appears not anxious. She is not hyperactive. She expresses inappropriate judgment (some immature words used when discussing her symptoms). She does not exhibit a depressed mood.  Some rambling and going back between the current symptoms and then going back to what happened about 4 years ago; patient needed redirecting to stay on task and stay focused      Assessment & Plan:   Problem List Items Addressed This Visit      Other   Low grade squamous intraepithelial lesion (LGSIL) on Papanicolaou smear of cervix    I reviewed some of her gyn information from Epic with her; she is overdue for her repeat pap; she needs to get back in to see her gynecologist for this; patient advised to call her gynecologist and schedule an appointment      Fatigue    Check labs; may be anemic from long heavy periods and subsequent iron deficiency anemia      Relevant Orders   TSH   Comprehensive metabolic panel   Heavy menstrual bleeding - Primary    Will check labs and have her call back to gynecologist for the birth control pill and follow-up; may need ultrasound; check thyroid labs and ferritin and CBC to make sure she' s not anemic      Relevant Orders   Ferritin   Von Willebrand panel   PT and PTT   CBC with Differential/Platelet   Blood in stool    Patient will have gyn examine her for fissures, external hemorrhoids; stool cards given; sounds to be all related to bowel movements; if symptoms don't resolve with hydration, avoidance of constipation, return for visit here         Follow up plan: Return if symptoms worsen or fail to improve.  An after-visit summary was printed and given to  the patient at check-out.  Please see the patient instructions which may contain other information and recommendations beyond what is mentioned above in the assessment and plan.  Orders Placed This Encounter  Procedures  . TSH  . Ferritin  . Von Willebrand panel  . PT and PTT  . CBC with Differential/Platelet  . Comprehensive metabolic panel

## 2015-03-29 NOTE — Patient Instructions (Addendum)
Please do call your gynecologist about this excessive frequent bleeding that you've been having You are overdue for a pap smear so please get that scheduled with them as well Please do return tomorrow for labs (LAB schedule) Please do the stool cards and return those to [place] Ask your gynecologist to look for any hemorrhoids while they are checking you out If bleeding persists, then we're happy to check into that as well Please do start working with a therapist

## 2015-03-29 NOTE — Assessment & Plan Note (Addendum)
I reviewed some of her gyn information from Epic with her; she is overdue for her repeat pap; she needs to get back in to see her gynecologist for this; patient advised to call her gynecologist and schedule an appointment

## 2015-03-31 DIAGNOSIS — K921 Melena: Secondary | ICD-10-CM | POA: Insufficient documentation

## 2015-03-31 NOTE — Assessment & Plan Note (Signed)
Check labs; may be anemic from long heavy periods and subsequent iron deficiency anemia

## 2015-03-31 NOTE — Assessment & Plan Note (Signed)
Patient will have gyn examine her for fissures, external hemorrhoids; stool cards given; sounds to be all related to bowel movements; if symptoms don't resolve with hydration, avoidance of constipation, return for visit here

## 2015-04-05 ENCOUNTER — Other Ambulatory Visit: Payer: Self-pay | Admitting: Advanced Practice Midwife

## 2015-04-05 DIAGNOSIS — N921 Excessive and frequent menstruation with irregular cycle: Secondary | ICD-10-CM

## 2015-04-05 MED ORDER — NORETHIN-ETH ESTRADIOL-FE 0.8-25 MG-MCG PO CHEW: 1.0000 | CHEWABLE_TABLET | Freq: Every day | ORAL | Status: DC

## 2015-04-05 NOTE — Telephone Encounter (Signed)
Pt needs a pack of birth control pills to be called in for her before she goes on vacation next week.. She uses the [facility].

## 2015-04-05 NOTE — Telephone Encounter (Signed)
Sent 1 pk of OCP to pharmacy with 1 refill. Explained to pt that she would need to contact office for routine annual before any other refills. Pt expressed understanding

## 2015-05-04 ENCOUNTER — Telehealth: Payer: Self-pay | Admitting: Family Medicine

## 2015-05-04 NOTE — Telephone Encounter (Signed)
Patient has overdue labs Please ask her to schedule lab appt this week or next

## 2015-05-05 NOTE — Telephone Encounter (Signed)
Left vm on pt phone to call us back and schedule a lab appointment.

## 2015-05-31 ENCOUNTER — Ambulatory Visit (INDEPENDENT_AMBULATORY_CARE_PROVIDER_SITE_OTHER): Payer: Managed Care, Other (non HMO) | Admitting: Family Medicine

## 2015-05-31 ENCOUNTER — Telehealth: Payer: Self-pay | Admitting: Family Medicine

## 2015-05-31 ENCOUNTER — Encounter: Payer: Self-pay | Admitting: Family Medicine

## 2015-05-31 VITALS — BP 112/74 | HR 73 | Temp 97.5°F | Ht 66.25 in | Wt 196.0 lb

## 2015-05-31 DIAGNOSIS — R5382 Chronic fatigue, unspecified: Secondary | ICD-10-CM

## 2015-05-31 DIAGNOSIS — J029 Acute pharyngitis, unspecified: Secondary | ICD-10-CM | POA: Diagnosis not present

## 2015-05-31 DIAGNOSIS — R059 Cough, unspecified: Secondary | ICD-10-CM

## 2015-05-31 DIAGNOSIS — R05 Cough: Secondary | ICD-10-CM

## 2015-05-31 LAB — MONONUCLEOSIS SCREEN: MONO SCREEN: NEGATIVE

## 2015-05-31 MED ORDER — BENZONATATE 100 MG PO CAPS: 100.0000 mg | ORAL_CAPSULE | Freq: Three times a day (TID) | ORAL | Status: DC | PRN

## 2015-05-31 NOTE — Assessment & Plan Note (Signed)
Check rapid strep, throat culture, mono; symptomatic care discussed

## 2015-05-31 NOTE — Assessment & Plan Note (Addendum)
New problem; start medicine (Rx) to treat the cough; doubt antibiotics would be helpful at this point; should be self-limiting; will bypass getting CXR; O2 sat 99%; watch for s/s of worsening and call if needed

## 2015-05-31 NOTE — Telephone Encounter (Signed)
Mono test was negative; rapid strep is negative; throat culture pending Patient notified

## 2015-05-31 NOTE — Assessment & Plan Note (Signed)
Worse with acute illness; check mono

## 2015-05-31 NOTE — Patient Instructions (Addendum)
Try vitamin C (orange juice if not diabetic or vitamin C tablets) and drink green tea to help your immune system during your illness Get plenty of rest and hydration We'll contact you about the mono test, which will be back a little later today This is most likely a viral illness in which antibiotics don't help; however, watch for signs and symptoms of a secondary infection (worsening symptoms, fever, purulent drainage, etc.) and call if getting worse   Sore Throat A sore throat is a painful, burning, sore, or scratchy feeling of the throat. There may be pain or tenderness when swallowing or talking. You may have other symptoms with a sore throat. These include coughing, sneezing, fever, or a swollen neck. A sore throat is often the first sign of another sickness. These sicknesses may include a cold, flu, strep throat, or an infection called mono. Most sore throats go away without medical treatment.  HOME CARE   Only take medicine as told by your doctor.  Drink enough fluids to keep your pee (urine) clear or pale yellow.  Rest as needed.  Try using throat sprays, lozenges, or suck on hard candy (if older than 4 years or as told).  Sip warm liquids, such as broth, herbal tea, or warm water with honey. Try sucking on frozen ice pops or drinking cold liquids.  Rinse the mouth (gargle) with salt water. Mix 1 teaspoon salt with 8 ounces of water.  Do not smoke. Avoid being around others when they are smoking.  Put a humidifier in your bedroom at night to moisten the air. You can also turn on a hot shower and sit in the bathroom for 5-10 minutes. Be sure the bathroom door is closed. GET HELP RIGHT AWAY IF:   You have trouble breathing.  You cannot swallow fluids, soft foods, or your spit (saliva).  You have more puffiness (swelling) in the throat.  Your sore throat does not get better in 7 days.  You feel sick to your stomach (nauseous) and throw up (vomit).  You have a fever or  lasting symptoms for more than 2-3 days.  You have a fever and your symptoms suddenly get worse. MAKE SURE YOU:   Understand these instructions.  Will watch your condition.  Will get help right away if you are not doing well or get worse. Document Released: 05/28/2008 Document Revised: 05/13/2012 Document Reviewed: 04/26/2012 [facility] Patient Information 2015 [place], [place]. This information is not intended to replace advice given to you by your health care provider. Make sure you discuss any questions you have with your health care provider. Viral Infections A viral infection can be caused by different types of viruses.Most viral infections are not serious and resolve on their own. However, some infections may cause severe symptoms and may lead to further complications. SYMPTOMS Viruses can frequently cause:  Minor sore throat.  Aches and pains.  Headaches.  Runny nose.  Different types of rashes.  Watery eyes.  Tiredness.  Cough.  Loss of appetite.  Gastrointestinal infections, resulting in nausea, vomiting, and diarrhea. These symptoms do not respond to antibiotics because the infection is not caused by bacteria. However, you might catch a bacterial infection following the viral infection. This is sometimes called a "superinfection." Symptoms of such a bacterial infection may include:  Worsening sore throat with pus and difficulty swallowing.  Swollen neck glands.  Chills and a high or persistent fever.  Severe headache.  Tenderness over the sinuses.  Persistent overall ill feeling (malaise),  muscle aches, and tiredness (fatigue).  Persistent cough.  Yellow, green, or brown mucus production with coughing. HOME CARE INSTRUCTIONS   Only take over-the-counter or prescription medicines for pain, discomfort, diarrhea, or fever as directed by your caregiver.  Drink enough water and fluids to keep your urine clear or pale yellow. Sports drinks can provide  valuable electrolytes, sugars, and hydration.  Get plenty of rest and maintain proper nutrition. Soups and broths with crackers or rice are fine. SEEK IMMEDIATE MEDICAL CARE IF:   You have severe headaches, shortness of breath, chest pain, neck pain, or an unusual rash.  You have uncontrolled vomiting, diarrhea, or you are unable to keep down fluids.  You or your child has an oral temperature above 102 F (38.9 C), not controlled by medicine.  Your baby is older than 3 months with a rectal temperature of 102 F (38.9 C) or higher.  Your baby is 69 months old or younger with a rectal temperature of 100.4 F (38 C) or higher. MAKE SURE YOU:   Understand these instructions.  Will watch your condition.  Will get help right away if you are not doing well or get worse. Document Released: 05/29/2005 Document Revised: 11/11/2011 Document Reviewed: 12/24/2010 [facility] Patient Information 2015 [place], [place]. This information is not intended to replace advice given to you by your health care provider. Make sure you discuss any questions you have with your health care provider.

## 2015-05-31 NOTE — Progress Notes (Signed)
BP 112/74 mmHg  Pulse 73  Temp(Src) 97.5 F (36.4 C)  Ht 5' 6.25" (1.683 m)  Wt 196 lb (88.905 kg)  BMI 31.39 kg/m2  SpO2 99%  LMP 04/17/2015 (Approximate)   Subjective:    Patient ID: [name], female    DOB: 1986/06/13, 29 y.o.   MRN: [id number]  HPI: [name] is a 29 y.o. female  Chief Complaint  Patient presents with  . URI    for a week now, bad cough, sore throat, when she wakes up in the morning she feels like she has a stack of bricks on her chest.   Her throat hurts really bad; hurts to swallow; started a week ago; starting to lose her voice Treating it at home with medicine; mucinex and tylenol allergy and cold, eating chloraseptic cough drops Worse at night; can't stop coughing; bringing up yucky green and bloody stuff; has hx of bronchitis No fevers; no swelling glands but does hurt in the sides of the neck; has had headache for 3-4 days; thought maybe sinuses No rash; no nausea or vomiting No known sick contacts; has children ages 65, 7, and 4 and they attend school; they are well  Relevant past medical, surgical, family and social history reviewed and updated as indicated. Interim medical history since our last visit reviewed. Allergies and medications reviewed and updated.  Review of Systems  Constitutional: Positive for fatigue. Negative for fever and chills.  HENT: Positive for congestion, postnasal drip, sneezing ("just so-so more than normal, not too bad"), sore throat, trouble swallowing and voice change. Negative for ear discharge, ear pain, hearing loss, mouth sores, nosebleeds, rhinorrhea and sinus pressure.   Respiratory: Positive for cough, chest tightness and shortness of breath. Negative for wheezing.   Cardiovascular: Positive for chest pain (across the upper mid chest worse with cough).  Gastrointestinal: Negative for nausea, vomiting and abdominal pain.  Musculoskeletal: Negative for joint swelling and arthralgias.  Skin: Negative  for rash.  Neurological: Positive for headaches.  Hematological: Negative for adenopathy ( but sore across the sides of the neck).   Per HPI unless specifically indicated above     Objective:    BP 112/74 mmHg  Pulse 73  Temp(Src) 97.5 F (36.4 C)  Ht 5' 6.25" (1.683 m)  Wt 196 lb (88.905 kg)  BMI 31.39 kg/m2  SpO2 99%  LMP 04/17/2015 (Approximate)  Wt Readings from Last 3 Encounters:  05/31/15 196 lb (88.905 kg)  03/29/15 201 lb (91.173 kg)  07/22/14 197 lb (89.359 kg)    Physical Exam  Constitutional: She appears well-developed and well-nourished. No distress.  Appears to not feel well, but nontoxic  HENT:  Head: Normocephalic and atraumatic.  Right Ear: Hearing, tympanic membrane, external ear and ear canal normal. No drainage. Tympanic membrane is not injected, not erythematous, not retracted and not bulging. No middle ear effusion.  Left Ear: Hearing, tympanic membrane, external ear and ear canal normal. No drainage. Tympanic membrane is not injected, not erythematous, not retracted and not bulging.  No middle ear effusion.  Nose: Rhinorrhea (scant clear) present. No epistaxis.  Mouth/Throat: Uvula is midline and mucous membranes are normal. Mucous membranes are not pale and not dry. No oral lesions. No uvula swelling. Posterior oropharyngeal erythema present. No oropharyngeal exudate or posterior oropharyngeal edema.  Status post tonsillectomy  Cardiovascular: Normal rate and regular rhythm.   No murmur heard. Abdominal: Soft. Bowel sounds are normal. She exhibits no distension. There is no hepatosplenomegaly. There is  no tenderness.  Lymphadenopathy:       Head (right side): No preauricular and no posterior auricular adenopathy present.       Head (left side): No preauricular and no posterior auricular adenopathy present.    She has cervical adenopathy.       Right cervical: Posterior cervical adenopathy present.       Left cervical: Posterior cervical adenopathy  present.  Slightly tender AC and PC LAD  Skin: No rash noted.  No jaundice  Psychiatric: She has a normal mood and affect.      Assessment & Plan:   Problem List Items Addressed This Visit      Respiratory   Acute pharyngitis - Primary    Check rapid strep, throat culture, mono; symptomatic care discussed      Relevant Orders   Strep Gp A Ag, IA W/Reflex   Mononucleosis screen     Other   Fatigue    Worse with acute illness; check mono      Cough    New problem         Follow up plan: No Follow-up on file.  An after-visit summary was printed and given to the patient at check-out.  Please see the patient instructions which may contain other information and recommendations beyond what is mentioned above in the assessment and plan.  Meds ordered this encounter  Medications  . benzonatate (TESSALON) 100 MG capsule    Sig: Take 1 capsule (100 mg total) by mouth every 8 (eight) hours as needed for cough.    Dispense:  30 capsule    Refill:  0   Orders Placed This Encounter  Procedures  . Strep Gp A Ag, IA W/Reflex  . Mononucleosis screen

## 2015-06-02 LAB — STREP GP A AG, IA W/REFLEX: STREP A CULTURE: NEGATIVE

## 2015-10-06 ENCOUNTER — Telehealth: Payer: Self-pay | Admitting: Family Medicine

## 2015-10-06 NOTE — Telephone Encounter (Signed)
Please let LEE-ANN GAL know that I'd like to see patient for an appointment here in the office for:  Follow-up to previous medical problems Please schedule a visit with me  in the next: two weeks Fasting?  Not necessary Thank you, Dr. Sherie Don --------------------- Reviewing expired lab orders; she never returned for multiple labs (CBC, ferritin, etc.) and I don't see that she followed up with GYN as instructed

## 2015-10-09 NOTE — Telephone Encounter (Signed)
Called patient to return our call and schedule a f/u appt for previous medical problems.

## 2015-10-12 ENCOUNTER — Encounter: Payer: Self-pay | Admitting: Family Medicine

## 2015-10-12 ENCOUNTER — Ambulatory Visit (INDEPENDENT_AMBULATORY_CARE_PROVIDER_SITE_OTHER): Payer: Managed Care, Other (non HMO) | Admitting: Family Medicine

## 2015-10-12 VITALS — BP 111/63 | HR 70 | Temp 98.7°F

## 2015-10-12 DIAGNOSIS — J069 Acute upper respiratory infection, unspecified: Secondary | ICD-10-CM | POA: Diagnosis not present

## 2015-10-12 DIAGNOSIS — Z5181 Encounter for therapeutic drug level monitoring: Secondary | ICD-10-CM | POA: Diagnosis not present

## 2015-10-12 DIAGNOSIS — R51 Headache: Secondary | ICD-10-CM

## 2015-10-12 DIAGNOSIS — R519 Headache, unspecified: Secondary | ICD-10-CM

## 2015-10-12 DIAGNOSIS — Z302 Encounter for sterilization: Secondary | ICD-10-CM | POA: Insufficient documentation

## 2015-10-12 MED ORDER — AMOXICILLIN-POT CLAVULANATE 875-125 MG PO TABS: 1.0000 | ORAL_TABLET | Freq: Two times a day (BID) | ORAL | Status: DC

## 2015-10-12 MED ORDER — HYDROCOD POLST-CPM POLST ER 10-8 MG/5ML PO SUER: 5.0000 mL | Freq: Two times a day (BID) | ORAL | Status: DC | PRN

## 2015-10-12 NOTE — Patient Instructions (Addendum)
CHRISTAN -- out of work Monday, out of work today and tomorrow Try vitamin C (orange juice if not diabetic or vitamin C tablets) and drink green tea to help your immune system during your illness Get plenty of rest and hydration Please do eat yogurt daily or take a probiotic daily for the next month or two We want to replace the healthy germs in the gut If you notice foul, watery diarrhea in the next two months, schedule an appointment RIGHT AWAY Okay to take two aleve (440 mg total) every twelve hours if needed Okay to take 3,000 mg of tylenol per day; that is the max (500 mg every 4 hours)

## 2015-10-12 NOTE — Progress Notes (Signed)
   BP 111/63 mmHg  Pulse 70  Temp(Src) 98.7 F (37.1 C)  SpO2 100%  LMP 10/06/2015 (Approximate)   Subjective:    Patient ID: [name], female    DOB: 05/14/1986, 30 y.o.   MRN: [id number]  HPI: [name] is a 30 y.o. female  Chief Complaint  Patient presents with  . Sinusitis    x 1 week. Head congestion, sinus headache, some cough.    Headache behind the left eye; right ear hurts under the ear and into the neck Sore throat at first, not now No rash No travel No GI symptoms, no N/V/D Can't smell anything Has had sinus infections before, but coughing up bloody greenish brown stuff She is taking 3 aleve at a time, 3 or 4 times a day for the last week; I immediate clarified and cautioned her that is much too much aleve and we'll check renal function today  Relevant past medical, social history reviewed and updated as indicated Allergies and medications reviewed and updated.  Review of Systems Per HPI unless specifically indicated above     Objective:    BP 111/63 mmHg  Pulse 70  Temp(Src) 98.7 F (37.1 C)  SpO2 100%  LMP 10/06/2015 (Approximate)  Wt Readings from Last 3 Encounters:  10/17/15 198 lb (89.812 kg)  10/13/15 198 lb (89.812 kg)  05/31/15 196 lb (88.905 kg)    Physical Exam  Constitutional: She appears well-developed and well-nourished. No distress (appears to not feel well but nontoxic).  HENT:  Head: Normocephalic and atraumatic.  Mouth/Throat: No oropharyngeal exudate.  Eyes: EOM are normal. Right eye exhibits no discharge. Left eye exhibits no discharge. No scleral icterus.  Neck: No thyromegaly present.  Cardiovascular: Normal rate and regular rhythm.   Pulmonary/Chest: Effort normal and breath sounds normal.  Lymphadenopathy:    She has no cervical adenopathy.  Psychiatric: She has a normal mood and affect.       Assessment & Plan:   Problem List Items Addressed This Visit      Other   Medication monitoring encounter   Relevant Orders   Basic Metabolic Panel (BMET) (Completed)    Other Visit Diagnoses    Upper respiratory infection    -  Primary    rest, hydration, vitamin C, supportive care; reasons to seek medical attention reviewed    Acute nonintractable headache, unspecified headache type        explained she is taking way too mcuh aleve; can be harmful; will check renal function; see AVS for suggestions        Follow up plan: No Follow-up on file.  An after-visit summary was printed and given to the patient at check-out.  Please see the patient instructions which may contain other information and recommendations beyond what is mentioned above in the assessment and plan.

## 2015-10-13 ENCOUNTER — Telehealth: Payer: Self-pay | Admitting: Family Medicine

## 2015-10-13 ENCOUNTER — Emergency Department
Admission: EM | Admit: 2015-10-13 | Discharge: 2015-10-13 | Disposition: A | Discharge status: Home / Self Care | Payer: Managed Care, Other (non HMO) | Attending: Emergency Medicine | Admitting: Emergency Medicine

## 2015-10-13 ENCOUNTER — Encounter: Payer: Self-pay | Admitting: Emergency Medicine

## 2015-10-13 DIAGNOSIS — Z792 Long term (current) use of antibiotics: Secondary | ICD-10-CM | POA: Insufficient documentation

## 2015-10-13 DIAGNOSIS — F419 Anxiety disorder, unspecified: Secondary | ICD-10-CM | POA: Insufficient documentation

## 2015-10-13 DIAGNOSIS — R519 Headache, unspecified: Secondary | ICD-10-CM

## 2015-10-13 DIAGNOSIS — R51 Headache: Secondary | ICD-10-CM | POA: Insufficient documentation

## 2015-10-13 LAB — BASIC METABOLIC PANEL
BUN / CREAT RATIO: 17 (ref 8–20)
BUN: 12 mg/dL (ref 6–20)
CALCIUM: 9.2 mg/dL (ref 8.7–10.2)
CHLORIDE: 101 mmol/L (ref 96–106)
CO2: 24 mmol/L (ref 18–29)
Creatinine, Ser: 0.71 mg/dL (ref 0.57–1.00)
GFR, EST AFRICAN AMERICAN: 133 mL/min/{1.73_m2} (ref >59)
GFR, EST NON AFRICAN AMERICAN: 115 mL/min/{1.73_m2} (ref >59)
Glucose: 89 mg/dL (ref 65–99)
Potassium: 4.6 mmol/L (ref 3.5–5.2)
Sodium: 140 mmol/L (ref 134–144)

## 2015-10-13 MED ORDER — FEXOFENADINE-PSEUDOEPHED ER 60-120 MG PO TB12: 1.0000 | ORAL_TABLET | Freq: Two times a day (BID) | ORAL | Status: DC

## 2015-10-13 MED ORDER — BUTALBITAL-APAP-CAFFEINE 50-325-40 MG PO TABS: 1.0000 | ORAL_TABLET | Freq: Four times a day (QID) | ORAL | Status: DC | PRN

## 2015-10-13 MED ORDER — KETOROLAC TROMETHAMINE 60 MG/2ML IM SOLN
60.0000 mg | Freq: Once | INTRAMUSCULAR | Status: AC
Start: 2015-10-13 — End: 2015-10-13
  Administered 2015-10-13: 60 mg via INTRAMUSCULAR
  Filled 2015-10-13: qty 2

## 2015-10-13 MED ORDER — TRAMADOL HCL 50 MG PO TABS
50.0000 mg | ORAL_TABLET | Freq: Once | ORAL | Status: AC
  Administered 2015-10-13: 50 mg via ORAL
  Filled 2015-10-13: qty 1

## 2015-10-13 NOTE — Discharge Instructions (Signed)
Intended previous medications and start Allegra-D and Esgic as directed.

## 2015-10-13 NOTE — Telephone Encounter (Signed)
Left vm for patient to return our call and schedule a f/u appt to see Dr. Sherie Don.

## 2015-10-13 NOTE — Telephone Encounter (Signed)
Patient called in about her headache; I am starting a new note about this The headache started Monday; on the left hand-side behind the eye; no vision problems; her eye is swelling up really bad, not sure if from all the pressure; she is laying in bed and washcloth and dark room; pain medicine in the cough syrup has not touched it all I recommend going to Urgent Care now; they can scan her if they think necessary and can re-evaluate, if antibiotoics are not helping at all She can come in to see me next week for f/u and to go over some unfinished things (see other phone note); she agrees

## 2015-10-13 NOTE — Telephone Encounter (Signed)
This phone note is opened for a different reason Please ask her to schedule the f/u as requested below -- she was seen for an acute visit, not the reasons I asked for She needs to come in next week please I'll call her and document in a new note for the headache issue

## 2015-10-13 NOTE — ED Notes (Signed)
Reports starting cough meds and amoxicillin for a sinus infection yesterday.  States she was also dx with double ear infection.  Still having pain today.

## 2015-10-13 NOTE — Telephone Encounter (Signed)
Patient called stating that her headaches have not got better and wants for Dr. Sherie Don to prescribe her something else. Pleas call patient regarding this, thanks.

## 2015-10-13 NOTE — ED Provider Notes (Signed)
[facility] Emergency Department Provider Note  ____________________________________________  Time seen: Approximately 3:05 PM  I have reviewed the triage vital signs and the nursing notes.   HISTORY  Chief Complaint Facial Pain    HPI [name] is a 30 y.o. female patient complaining of sinus headache which started yesterday. Patient saw her family doctor yesterday and was given a prescription for Augmentin and Tussionex. States she was diagnosed having sinusitis and otitis media. She continued to have headache. She describes headache as a pressure sensation under her eyes. She denies any vision disturbance vertigo, or hearing loss. She rates it as a 10 over 10.   Past Medical History  Diagnosis Date  . Preterm labor   . Headache(784.0)   . Seizures (HCC)   . Gestational diabetes   . Urinary tract infection   . Ovarian cyst   . Abnormal Pap smear   . Complication of anesthesia     N/V, passing out with epidural  . Anxiety     Patient Active Problem List   Diagnosis Date Noted  . Medication monitoring encounter 10/12/2015  . Acute pharyngitis 05/31/2015  . Cough 05/31/2015  . Blood in stool 03/31/2015  . Fatigue 03/29/2015  . Heavy menstrual bleeding 03/29/2015  . Low grade squamous intraepithelial lesion (LGSIL) on Papanicolaou smear of cervix 09/29/2013  . Vaginal delivery 04/21/2011  . Supervision of pregnancy with history of pre-term labor 03/20/2011  . Threatened preterm labor, antepartum 03/20/2011    Past Surgical History  Procedure Laterality Date  . Therapeutic abortion    . Repair of fr left foot    . Tubal ligation  04/21/2011    Procedure: POST PARTUM TUBAL LIGATION;  Surgeon: Tereso Newcomer, MD;  Location: [facility];  Service: Gynecology;  Laterality: Bilateral;  Bilateral post partum tubal ligation with filshie clips.  . Tubalectomy Bilateral 2014    Current Outpatient Rx  Name  Route  Sig  Dispense  Refill  .  amoxicillin-clavulanate (AUGMENTIN) 875-125 MG tablet   Oral   Take 1 tablet by mouth 2 (two) times daily.   20 tablet   0   . butalbital-acetaminophen-caffeine (FIORICET) 50-325-40 MG tablet   Oral   Take 1-2 tablets by mouth every 6 (six) hours as needed for headache.   20 tablet   0   . chlorpheniramine-HYDROcodone (TUSSIONEX PENNKINETIC ER) 10-8 MG/5ML SUER   Oral   Take 5 mLs by mouth every 12 (twelve) hours as needed for cough.   115 mL   0   . fexofenadine-pseudoephedrine (ALLEGRA-D) 60-120 MG 12 hr tablet   Oral   Take 1 tablet by mouth 2 (two) times daily.   30 tablet   0     Allergies Review of patient's allergies indicates no known allergies.  Family History  Problem Relation Age of Onset  . Cancer Paternal Grandfather     leukemia  . Hyperthyroidism Father   . Stroke Father   . Hypertension Father   . Hypothyroidism Mother   . Cancer Mother     pancreatic  . Multiple sclerosis Paternal Aunt   . Diabetes Maternal Grandmother   . COPD Maternal Grandfather   . Stroke Paternal Grandmother   . Alzheimer's disease Paternal Grandmother   . Heart disease Neg Hx     Social History Social History  Substance Use Topics  . Smoking status: Never Smoker   . Smokeless tobacco: Never Used  . Alcohol Use: Yes  Comment: occasion    Review of Systems Constitutional: No fever/chills Eyes: No visual changes. ENT: No sore throat. Cardiovascular: Denies chest pain. Respiratory: Denies shortness of breath. Gastrointestinal: No abdominal pain.  No nausea, no vomiting.  No diarrhea.  No constipation. Genitourinary: Negative for dysuria. Musculoskeletal: Negative for back pain. Skin: Negative for rash. Neurological: Positive for frontal headaches, but denies focal weakness or numbness. Psychiatric:Anxiety  ____________________________________________   PHYSICAL EXAM:  VITAL SIGNS: ED Triage Vitals  Enc Vitals Group     BP 10/13/15 1451 128/69 mmHg      Pulse Rate 10/13/15 1451 76     Resp 10/13/15 1451 18     Temp 10/13/15 1451 98.6 F (37 C)     Temp Source 10/13/15 1451 Oral     SpO2 10/13/15 1451 98 %     Weight 10/13/15 1451 198 lb (89.812 kg)     Height 10/13/15 1451  (1.702 m)     Head Cir --      Peak Flow --      Pain Score --      Pain Loc --      Pain Edu? --      Excl. in GC? --     Constitutional: Alert and oriented. Well appearing and in no acute distress. Eyes: Conjunctivae are normal. PERRL. EOMI. Head: Atraumatic. Nose: Edematous bilateral nasal turbinates with thick nasal discharge. Bilateral frontal and maxillary sinus guarding. Mouth/Throat: Mucous membranes are moist.  Oropharynx non-erythematous. Neck: No stridor. No cervical spine tenderness to palpation. Hematological/Lymphatic/Immunilogical: No cervical lymphadenopathy. Cardiovascular: Normal rate, regular rhythm. Grossly normal heart sounds.  Good peripheral circulation. Respiratory: Normal respiratory effort.  No retractions. Lungs CTAB. Gastrointestinal: Soft and nontender. No distention. No abdominal bruits. No CVA tenderness. Musculoskeletal: No lower extremity tenderness nor edema.  No joint effusions. Neurologic:  Normal speech and language. No gross focal neurologic deficits are appreciated. No gait instability. Skin:  Skin is warm, dry and intact. No rash noted. Psychiatric: Mood and affect are normal. Speech and behavior are normal.  ____________________________________________   LABS (all labs ordered are listed, but only abnormal results are displayed)  Labs Reviewed - No data to display ____________________________________________  EKG   ____________________________________________  RADIOLOGY   ____________________________________________   PROCEDURES  Procedure(s) performed: None  Critical Care performed: No  ____________________________________________   INITIAL IMPRESSION / ASSESSMENT AND PLAN / ED  COURSE  Pertinent labs & imaging results that were available during my care of the patient were reviewed by me and considered in my medical decision making (see chart for details).  Sinus headache. Advised patient to continue previous medications. Patient given a prescription for Allegra-D and Esgic. Patient advised to follow-up family doctor for continued care. ____________________________________________   FINAL CLINICAL IMPRESSION(S) / ED DIAGNOSES  Final diagnoses:  Sinus headache      Joni Reining, PA-C 10/13/15 1517  Jennye Moccasin, MD 10/13/15 [phone]

## 2015-10-16 ENCOUNTER — Telehealth: Payer: Self-pay | Admitting: Family Medicine

## 2015-10-16 NOTE — Telephone Encounter (Signed)
Pt called and stated that she still isn't any better and would like to have something else sent to [facility].

## 2015-10-17 ENCOUNTER — Telehealth: Payer: Self-pay | Admitting: Family Medicine

## 2015-10-17 ENCOUNTER — Ambulatory Visit: Admission: RE | Admit: 2015-10-17 | Payer: Managed Care, Other (non HMO) | Source: Ambulatory Visit

## 2015-10-17 ENCOUNTER — Encounter: Payer: Self-pay | Admitting: Family Medicine

## 2015-10-17 ENCOUNTER — Ambulatory Visit (INDEPENDENT_AMBULATORY_CARE_PROVIDER_SITE_OTHER): Payer: Managed Care, Other (non HMO) | Admitting: Family Medicine

## 2015-10-17 ENCOUNTER — Ambulatory Visit
Admission: RE | Admit: 2015-10-17 | Discharge: 2015-10-17 | Disposition: A | Discharge status: Home / Self Care | Payer: Managed Care, Other (non HMO) | Source: Ambulatory Visit | Attending: Family Medicine | Admitting: Family Medicine

## 2015-10-17 VITALS — BP 124/78 | HR 91 | Temp 98.3°F | Ht 65.7 in | Wt 198.0 lb

## 2015-10-17 DIAGNOSIS — J322 Chronic ethmoidal sinusitis: Secondary | ICD-10-CM | POA: Diagnosis not present

## 2015-10-17 DIAGNOSIS — R51 Headache: Secondary | ICD-10-CM | POA: Diagnosis not present

## 2015-10-17 DIAGNOSIS — J0100 Acute maxillary sinusitis, unspecified: Secondary | ICD-10-CM

## 2015-10-17 DIAGNOSIS — R519 Headache, unspecified: Secondary | ICD-10-CM

## 2015-10-17 DIAGNOSIS — R062 Wheezing: Secondary | ICD-10-CM | POA: Diagnosis not present

## 2015-10-17 MED ORDER — PREDNISONE 10 MG PO TABS: ORAL_TABLET | ORAL | Status: DC

## 2015-10-17 MED ORDER — LEVOFLOXACIN 750 MG PO TABS: 750.0000 mg | ORAL_TABLET | Freq: Every day | ORAL | Status: DC

## 2015-10-17 MED ORDER — ALBUTEROL SULFATE (2.5 MG/3ML) 0.083% IN NEBU: 2.5000 mg | INHALATION_SOLUTION | Freq: Once | RESPIRATORY_TRACT | Status: DC

## 2015-10-17 NOTE — Telephone Encounter (Signed)
Routing to provider  

## 2015-10-17 NOTE — Telephone Encounter (Signed)
She needs an appt then, please

## 2015-10-17 NOTE — Progress Notes (Signed)
BP 124/78 mmHg  Pulse 91  Temp(Src) 98.3 F (36.8 C)  Ht 5' 5.7" (1.669 m)  Wt 198 lb (89.812 kg)  BMI 32.24 kg/m2  SpO2 98%  LMP 10/06/2015 (Approximate)   Subjective:    Patient ID: [name], female    DOB: [birth year], 30 y.o.   MRN: [id number]  HPI: DASHANA Nichols is a 30 y.o. female  Chief Complaint  Patient presents with  . URI    Patient was seen by Dr.Lada last week, she was diagnosed with a sinus infection, she is in severe pain in her face. She states that she can feel stuff move in her face. She had to go to the ER for a severe headache, they gave her an injection. She states that she is not any better she is worse   UPPER RESPIRATORY TRACT INFECTION Duration: 8 days Worst symptom: pain in her face Fever: no Cough: yes, bloody green stuff Shortness of breath: yes Wheezing: yes Chest pain: no Chest tightness: yes Chest congestion: yes Nasal congestion: yes Runny nose: yes Post nasal drip: yes Sneezing: yes Sore throat: yes Swollen glands: yes Sinus pressure: yes Headache: yes Face pain: yes Toothache: yes Ear pain: yes "right Ear pressure: yes "right Eyes red/itching:no Eye drainage/crusting: yes  Vomiting: no Rash: no Fatigue: yes Sick contacts: no Strep contacts: no  Context: worse Recurrent sinusitis: no Relief with OTC cold/cough medications: no  Treatments attempted: cold/sinus, mucinex, anti-histamine, pseudoephedrine, cough syrup and antibiotics   Relevant past medical, surgical, family and social history reviewed and updated as indicated. Interim medical history since our last visit reviewed. Allergies and medications reviewed and updated.  Review of Systems  Constitutional: Positive for fatigue. Negative for fever, chills, diaphoresis, activity change, appetite change and unexpected weight change.  HENT: Positive for congestion, ear pain, nosebleeds, postnasal drip, rhinorrhea, sinus pressure, sneezing and sore throat. Negative  for dental problem, drooling, ear discharge, facial swelling, hearing loss, mouth sores, tinnitus, trouble swallowing and voice change.   Respiratory: Positive for cough, chest tightness, shortness of breath and wheezing. Negative for apnea, choking and stridor.   Cardiovascular: Negative.   Psychiatric/Behavioral: Negative.     Per HPI unless specifically indicated above     Objective:    BP 124/78 mmHg  Pulse 91  Temp(Src) 98.3 F (36.8 C)  Ht 5' 5.7" (1.669 m)  Wt 198 lb (89.812 kg)  BMI 32.24 kg/m2  SpO2 98%  LMP 10/06/2015 (Approximate)  Wt Readings from Last 3 Encounters:  10/17/15 198 lb (89.812 kg)  10/13/15 198 lb (89.812 kg)  05/31/15 196 lb (88.905 kg)    Physical Exam  Constitutional: She is oriented to person, place, and time. She appears well-developed and well-nourished. She appears distressed.  HENT:  Head: Normocephalic and atraumatic.  Right Ear: Hearing and ear canal normal. No lacerations. There is tenderness. No drainage or swelling. No foreign bodies. No mastoid tenderness. Tympanic membrane is scarred. Tympanic membrane is not injected, not perforated, not erythematous, not retracted and not bulging. Tympanic membrane mobility is normal. A middle ear effusion is present. No hemotympanum. No decreased hearing is noted.  Left Ear: Hearing, tympanic membrane, external ear and ear canal normal.  Nose: Mucosal edema, rhinorrhea and sinus tenderness present. No nose lacerations, nasal deformity, septal deviation or nasal septal hematoma. No epistaxis.  No foreign bodies. Right sinus exhibits maxillary sinus tenderness and frontal sinus tenderness.  Mouth/Throat: Uvula is midline, oropharynx is clear and moist and mucous membranes are normal.  No oropharyngeal exudate.  Eyes: Conjunctivae, EOM and lids are normal. Pupils are equal, round, and reactive to light. Right eye exhibits no discharge. Left eye exhibits no discharge. No scleral icterus.  Cardiovascular: Normal  rate, regular rhythm, normal heart sounds and intact distal pulses.  Exam reveals no gallop and no friction rub.   No murmur heard. Pulmonary/Chest: Effort normal. No respiratory distress. She has wheezes. She has no rales. She exhibits no tenderness.  Musculoskeletal: Normal range of motion.  Neurological: She is alert and oriented to person, place, and time.  Skin: Skin is intact. No rash noted. She is not diaphoretic.  Psychiatric: She has a normal mood and affect. Her speech is normal and behavior is normal. Judgment and thought content normal. Cognition and memory are normal.  Nursing note and vitals reviewed.   Results for orders placed or performed in visit on 10/12/15  Basic Metabolic Panel (BMET)  Result Value Ref Range   Glucose 89 65 - 99 mg/dL   BUN 12 6 - 20 mg/dL   Creatinine, Ser 1.61 0.57 - 1.00 mg/dL   GFR calc non Af Amer 115 >59 mL/min/1.73   GFR calc Af Amer 133 >59 mL/min/1.73   BUN/Creatinine Ratio 17 8 - 20   Sodium 140 134 - 144 mmol/L   Potassium 4.6 3.5 - 5.2 mmol/L   Chloride 101 96 - 106 mmol/L   CO2 24 18 - 29 mmol/L   Calcium 9.2 8.7 - 10.2 mg/dL      Assessment & Plan:   Problem List Items Addressed This Visit    None    Visit Diagnoses    Acute maxillary sinusitis, recurrence not specified    -  Primary    Will treat with levaquin as the augmentin didn't seem to help. Keep close eye on and check back by phone in 1-2 days to see how she's doing.     Relevant Medications    levofloxacin (LEVAQUIN) 750 MG tablet    predniSONE (DELTASONE) 10 MG tablet    Other Relevant Orders    CT Head Wo Contrast (Completed)    Wheezing        Not significantly better with neb. Will treat with prednisone. Monitor closely. Call with any problems. Lung recheck at follow up with PCP in 1-2 weeks.     Relevant Medications    albuterol (PROVENTIL) (2.5 MG/3ML) 0.083% nebulizer solution 2.5 mg    Acute intractable headache, unspecified headache type        Given the  severity of the pain. Will check CT of the head today. Ordered STAT. Await results.     Relevant Orders    CT Head Wo Contrast (Completed)        Follow up plan: Return in about 1 week (around 10/24/2015) for Follow up with Dr. Sherie Don.

## 2015-10-17 NOTE — Telephone Encounter (Signed)
Called patient with the results of her CT. Bilateral ethmoid sinusitis. Continue plan from note. She will call with any concerns.

## 2015-10-19 ENCOUNTER — Telehealth: Payer: Self-pay | Admitting: Family Medicine

## 2015-10-19 NOTE — Telephone Encounter (Signed)
Called to see how Imonie is doing with her sinusitis. No answer. LMOM for her to call back.

## 2015-10-19 NOTE — Telephone Encounter (Signed)
-----   Message from Dorcas Carrow, DO sent at 10/17/2015 12:31 PM EST ----- Call to see how she's doing.

## 2015-10-24 ENCOUNTER — Ambulatory Visit: Payer: Managed Care, Other (non HMO)

## 2015-10-27 ENCOUNTER — Ambulatory Visit: Payer: Managed Care, Other (non HMO) | Admitting: Family Medicine

## 2016-05-10 ENCOUNTER — Encounter: Payer: Self-pay | Admitting: Family Medicine

## 2016-05-10 ENCOUNTER — Ambulatory Visit (INDEPENDENT_AMBULATORY_CARE_PROVIDER_SITE_OTHER): Payer: Managed Care, Other (non HMO) | Admitting: Family Medicine

## 2016-05-10 VITALS — BP 117/83 | HR 74 | Temp 98.4°F | Ht 66.4 in | Wt 193.0 lb

## 2016-05-10 DIAGNOSIS — F419 Anxiety disorder, unspecified: Secondary | ICD-10-CM

## 2016-05-10 DIAGNOSIS — B354 Tinea corporis: Secondary | ICD-10-CM | POA: Diagnosis not present

## 2016-05-10 DIAGNOSIS — G4700 Insomnia, unspecified: Secondary | ICD-10-CM | POA: Diagnosis not present

## 2016-05-10 DIAGNOSIS — L700 Acne vulgaris: Secondary | ICD-10-CM | POA: Diagnosis not present

## 2016-05-10 MED ORDER — TRAZODONE HCL 50 MG PO TABS: 25.0000 mg | ORAL_TABLET | Freq: Every evening | ORAL | 1 refills | Status: DC | PRN

## 2016-05-10 MED ORDER — NYSTATIN-TRIAMCINOLONE 100000-0.1 UNIT/GM-% EX OINT: 1.0000 "application " | TOPICAL_OINTMENT | Freq: Two times a day (BID) | CUTANEOUS | 0 refills | Status: DC

## 2016-05-10 MED ORDER — CITALOPRAM HYDROBROMIDE 20 MG PO TABS: ORAL_TABLET | ORAL | 1 refills | Status: DC

## 2016-05-10 MED ORDER — CLINDAMYCIN PHOS-BENZOYL PEROX 1-5 % EX GEL: Freq: Two times a day (BID) | CUTANEOUS | 1 refills | Status: DC

## 2016-05-10 NOTE — Progress Notes (Signed)
BP 117/83 (BP Location: Left Arm, Patient Position: Sitting, Cuff Size: Normal)   Pulse 74   Temp 98.4 F (36.9 C)   Ht 5' 6.4" (1.687 m)   Wt 193 lb (87.5 kg)   LMP 04/29/2016 (Exact Date)   SpO2 98%   BMI 30.78 kg/m    Subjective:    Patient ID: [name], female    DOB: 1986/03/16, 30 y.o.   MRN: [id number]  HPI: [name] is a 30 y.o. female  Chief Complaint  Patient presents with  . Rash    Patient has large bumps on the side of her face, she states that they hurt. Patient has a area on left wrist that is discolored.  . Insomnia   RASH Duration:  1.5 months  Location: wrist  Itching: yes Burning: no Redness: yes Oozing: no Scaling: no Blisters: yes Painful: no Fevers: no Change in detergents/soaps/personal care products: no Recent illness: no Recent travel:no History of same: no Context: stable Alleviating factors: nothing Treatments attempted:lotion/moisturizer Shortness of breath: no  Throat/tongue swelling: no Myalgias/arthralgias: no  ACNE Duration: several weeks Current face care: OTC acne preparations Current acne medications: none Previous acne medications: none Acne problem areas:face and neck  Worst area:neck  Picking/popping habits: no Previous dermatology evaluation: no Aggravating factors: stress Acne status: uncontrolled  INSOMNIA/ANXIETY- lost her cousin and her mom. Going through a divorce. Not feeling like herself. Constantly worrying. Can't sleep at all. Not doing well at all.  Duration: 2 months Satisfied with sleep quality: yes Difficulty falling asleep: yes Difficulty staying asleep: no Waking a few hours after sleep onset: yes Early morning awakenings: yes Daytime hypersomnolence: yes Wakes feeling refreshed: no Good sleep hygiene: no Apnea: no Snoring: no Depressed/anxious mood: yes Recent stress: yes Restless legs/nocturnal leg cramps: no Chronic pain/arthritis: no History of sleep study:  no Treatments attempted: melatonin, uinsom and benadryl   Relevant past medical, surgical, family and social history reviewed and updated as indicated. Interim medical history since our last visit reviewed. Allergies and medications reviewed and updated.  Review of Systems  Constitutional: Negative.   Respiratory: Negative.   Cardiovascular: Negative.   Psychiatric/Behavioral: Positive for agitation, decreased concentration and sleep disturbance. Negative for behavioral problems, confusion, dysphoric mood, hallucinations, self-injury and suicidal ideas. The patient is nervous/anxious. The patient is not hyperactive.     Per HPI unless specifically indicated above     Objective:    BP 117/83 (BP Location: Left Arm, Patient Position: Sitting, Cuff Size: Normal)   Pulse 74   Temp 98.4 F (36.9 C)   Ht 5' 6.4" (1.687 m)   Wt 193 lb (87.5 kg)   LMP 04/29/2016 (Exact Date)   SpO2 98%   BMI 30.78 kg/m   Wt Readings from Last 3 Encounters:  05/10/16 193 lb (87.5 kg)  10/17/15 198 lb (89.8 kg)  10/13/15 198 lb (89.8 kg)    Physical Exam  Constitutional: She is oriented to person, place, and time. She appears well-developed and well-nourished. No distress.  HENT:  Head: Normocephalic and atraumatic.  Right Ear: Hearing normal.  Left Ear: Hearing normal.  Nose: Nose normal.  Eyes: Conjunctivae and lids are normal. Right eye exhibits no discharge. Left eye exhibits no discharge. No scleral icterus.  Cardiovascular: Normal rate, regular rhythm, normal heart sounds and intact distal pulses.  Exam reveals no gallop and no friction rub.   No murmur heard. Pulmonary/Chest: Effort normal and breath sounds normal. No respiratory distress. She has no  wheezes. She has no rales. She exhibits no tenderness.  Musculoskeletal: Normal range of motion.  Neurological: She is alert and oriented to person, place, and time.  Skin: Skin is warm, dry and intact. Rash noted. No erythema. No pallor.   Acne on her neck, rash on L posterior wrist with clearing in the center  Psychiatric: Her speech is normal. Judgment and thought content normal. Her mood appears anxious. She is agitated. Cognition and memory are normal.  Nursing note and vitals reviewed.   Results for orders placed or performed in visit on 10/12/15  Basic Metabolic Panel (BMET)  Result Value Ref Range   Glucose 89 65 - 99 mg/dL   BUN 12 6 - 20 mg/dL   Creatinine, Ser 1.61 0.57 - 1.00 mg/dL   GFR calc non Af Amer 115 >59 mL/min/1.73   GFR calc Af Amer 133 >59 mL/min/1.73   BUN/Creatinine Ratio 17 8 - 20   Sodium 140 134 - 144 mmol/L   Potassium 4.6 3.5 - 5.2 mmol/L   Chloride 101 96 - 106 mmol/L   CO2 24 18 - 29 mmol/L   Calcium 9.2 8.7 - 10.2 mg/dL      Assessment & Plan:   Problem List Items Addressed This Visit      Other   Acute anxiety    Lots of social issues going on including a divorce. Will start celexa and check in in 1 month. Call with concerns.       Relevant Medications   traZODone (DESYREL) 50 MG tablet   citalopram (CELEXA) 20 MG tablet   Insomnia    Due to anxiety. Will start her on trazodone. Call with concerns. Recheck 1 month.        Other Visit Diagnoses    Tinea corporis    -  Primary   Will treat with triamcinalone and nystatin. Call if not getting better.    Relevant Medications   nystatin-triamcinolone ointment (MYCOLOG)   Acne vulgaris       Due to stress. Start benzoclin. Call with any concerns. Recheck 1 month.    Relevant Medications   clindamycin-benzoyl peroxide (BENZACLIN) gel       Follow up plan: Return in about 4 weeks (around 06/07/2016) for Follow up on mood and sleep .

## 2016-05-10 NOTE — Assessment & Plan Note (Signed)
Lots of social issues going on including a divorce. Will start celexa and check in in 1 month. Call with concerns.

## 2016-05-10 NOTE — Assessment & Plan Note (Signed)
Due to anxiety. Will start her on trazodone. Call with concerns. Recheck 1 month.

## 2016-06-07 ENCOUNTER — Ambulatory Visit (INDEPENDENT_AMBULATORY_CARE_PROVIDER_SITE_OTHER): Payer: Managed Care, Other (non HMO) | Admitting: Family Medicine

## 2016-06-07 ENCOUNTER — Encounter: Payer: Self-pay | Admitting: Family Medicine

## 2016-06-07 VITALS — BP 102/61 | HR 69 | Temp 98.2°F | Wt 195.0 lb

## 2016-06-07 DIAGNOSIS — L700 Acne vulgaris: Secondary | ICD-10-CM

## 2016-06-07 DIAGNOSIS — R5382 Chronic fatigue, unspecified: Secondary | ICD-10-CM

## 2016-06-07 DIAGNOSIS — F5105 Insomnia due to other mental disorder: Secondary | ICD-10-CM

## 2016-06-07 DIAGNOSIS — F99 Mental disorder, not otherwise specified: Secondary | ICD-10-CM | POA: Diagnosis not present

## 2016-06-07 DIAGNOSIS — F419 Anxiety disorder, unspecified: Secondary | ICD-10-CM

## 2016-06-07 MED ORDER — CITALOPRAM HYDROBROMIDE 40 MG PO TABS: ORAL_TABLET | ORAL | 1 refills | Status: DC

## 2016-06-07 MED ORDER — CLINDAMYCIN PHOS-BENZOYL PEROX 1-5 % EX GEL: Freq: Two times a day (BID) | CUTANEOUS | 12 refills | Status: DC

## 2016-06-07 NOTE — Assessment & Plan Note (Signed)
Not noticing much of a difference on celexa. Will increase dose and recheck in 1 month, if no improvement, may need to change to something else.

## 2016-06-07 NOTE — Progress Notes (Signed)
BP 102/61 (BP Location: Left Arm, Patient Position: Sitting, Cuff Size: Normal)   Pulse 69   Temp 98.2 F (36.8 C)   Wt 195 lb (88.5 kg)   LMP 05/31/2016 (Exact Date)   SpO2 99%   BMI 31.10 kg/m    Subjective:    Patient ID: [name], female    DOB: 1986/08/22, 30 y.o.   MRN: [id number]  HPI: [name] is a 30 y.o. female  Chief Complaint  Patient presents with  . Depression    does not like meds  . Insomnia    meds not effective  . Fatigue    family history of thyroid issues   DEPRESSION-  Mood status: stable Satisfied with current treatment?: no Symptom severity: moderate  Duration of current treatment : 1 month Side effects: yes- feeling really moody Medication compliance: good compliance Psychotherapy/counseling: no  Previous psychiatric medications: celexa Depressed mood: yes Anxious mood: yes Anhedonia: no Significant weight loss or gain: no Insomnia: yes hard to fall asleep Fatigue: yes Feelings of worthlessness or guilt: yes Impaired concentration/indecisiveness: yes Suicidal ideations: no Hopelessness: no Crying spells: yes Depression screen PHQ 2/9 06/07/2016  Decreased Interest 2  Down, Depressed, Hopeless 1  PHQ - 2 Score 3  Altered sleeping 3  Tired, decreased energy 3  Change in appetite 1  Feeling bad or failure about yourself  1  Trouble concentrating 0  Moving slowly or fidgety/restless 0  Suicidal thoughts 0  PHQ-9 Score 11   ACNE Duration: months Current face care: using plain soap such as [place], [place], Purpose or Basis Current acne medications: benzoyl peroxide Previous acne medications: none Acne problem areas:face  Worst area:face  Picking/popping habits: no Previous dermatology evaluation: no Aggravating factors: stress Acne status: better  INSOMNIA Duration: chronic Satisfied with sleep quality: no Difficulty falling asleep: yes Difficulty staying asleep: yes Waking a few hours after sleep onset:  yes Early morning awakenings: no Daytime hypersomnolence: yes Wakes feeling refreshed: no Good sleep hygiene: no Apnea: no Snoring: no Depressed/anxious mood: yes Recent stress: yes Restless legs/nocturnal leg cramps: no Chronic pain/arthritis: no History of sleep study: no Treatments attempted: melatonin, uinsom and benadryl, trazodone   Relevant past medical, surgical, family and social history reviewed and updated as indicated. Interim medical history since our last visit reviewed. Allergies and medications reviewed and updated.  Review of Systems  Constitutional: Negative.   Eyes: Negative.   Respiratory: Negative.   Psychiatric/Behavioral: Positive for agitation, decreased concentration, dysphoric mood and sleep disturbance. Negative for behavioral problems, confusion, hallucinations, self-injury and suicidal ideas. The patient is nervous/anxious and is hyperactive.     Per HPI unless specifically indicated above     Objective:    BP 102/61 (BP Location: Left Arm, Patient Position: Sitting, Cuff Size: Normal)   Pulse 69   Temp 98.2 F (36.8 C)   Wt 195 lb (88.5 kg)   LMP 05/31/2016 (Exact Date)   SpO2 99%   BMI 31.10 kg/m   Wt Readings from Last 3 Encounters:  06/07/16 195 lb (88.5 kg)  05/10/16 193 lb (87.5 kg)  10/17/15 198 lb (89.8 kg)    Physical Exam  Constitutional: She is oriented to person, place, and time. She appears well-developed and well-nourished. No distress.  HENT:  Head: Normocephalic and atraumatic.  Right Ear: Hearing normal.  Left Ear: Hearing normal.  Nose: Nose normal.  Eyes: Conjunctivae and lids are normal. Right eye exhibits no discharge. Left eye exhibits no discharge. No scleral  icterus.  Cardiovascular: Normal rate, regular rhythm, normal heart sounds and intact distal pulses.  Exam reveals no gallop and no friction rub.   No murmur heard. Pulmonary/Chest: Effort normal and breath sounds normal. No respiratory distress. She has no  wheezes. She has no rales. She exhibits no tenderness.  Musculoskeletal: Normal range of motion.  Neurological: She is alert and oriented to person, place, and time.  Skin: Skin is warm, dry and intact. No rash noted. No erythema. No pallor.  Acne on side of the face improving  Psychiatric: She has a normal mood and affect. Her speech is normal and behavior is normal. Judgment and thought content normal. Cognition and memory are normal.  Nursing note and vitals reviewed.   Results for orders placed or performed in visit on 10/12/15  Basic Metabolic Panel (BMET)  Result Value Ref Range   Glucose 89 65 - 99 mg/dL   BUN 12 6 - 20 mg/dL   Creatinine, Ser 0.86 0.57 - 1.00 mg/dL   GFR calc non Af Amer 115 >59 mL/min/1.73   GFR calc Af Amer 133 >59 mL/min/1.73   BUN/Creatinine Ratio 17 8 - 20   Sodium 140 134 - 144 mmol/L   Potassium 4.6 3.5 - 5.2 mmol/L   Chloride 101 96 - 106 mmol/L   CO2 24 18 - 29 mmol/L   Calcium 9.2 8.7 - 10.2 mg/dL      Assessment & Plan:   Problem List Items Addressed This Visit      Other   Acute anxiety - Primary    Not noticing much of a difference on celexa. Will increase dose and recheck in 1 month, if no improvement, may need to change to something else.       Relevant Medications   citalopram (CELEXA) 40 MG tablet   Insomnia    Likely due to anxiety and depression. Continue trazodone. Will check labs. Await results. Recheck 1 month.        Other Visit Diagnoses    Chronic fatigue       Likely due to depression. Will check labs to look for organic cause. Await results.    Relevant Orders   CBC with Differential/Platelet   Comprehensive metabolic panel   TSH   Mononucleosis screen   VITAMIN D 25 Hydroxy (Vit-D Deficiency, Fractures)   Rocky mtn spotted fvr abs pnl(IgG+IgM)   Lyme Ab/Western Blot Reflex   Babesia microti Antibody Panel   Ehrlichia Antibody Panel   Acne vulgaris       Improving. Refills given today.   Relevant Medications     clindamycin-benzoyl peroxide (BENZACLIN) gel       Follow up plan: Return in about 4 weeks (around 07/05/2016) for Follow up mood.

## 2016-06-07 NOTE — Assessment & Plan Note (Signed)
Likely due to anxiety and depression. Continue trazodone. Will check labs. Await results. Recheck 1 month.

## 2016-06-11 ENCOUNTER — Telehealth: Payer: Self-pay | Admitting: Family Medicine

## 2016-06-11 ENCOUNTER — Encounter: Payer: Self-pay | Admitting: Family Medicine

## 2016-06-11 LAB — CBC WITH DIFFERENTIAL/PLATELET
BASOS: 0 %
Basophils Absolute: 0 10*3/uL (ref 0.0–0.2)
EOS (ABSOLUTE): 0.1 10*3/uL (ref 0.0–0.4)
EOS: 2 %
HEMATOCRIT: 42 % (ref 34.0–46.6)
HEMOGLOBIN: 14 g/dL (ref 11.1–15.9)
IMMATURE GRANS (ABS): 0 10*3/uL (ref 0.0–0.1)
Immature Granulocytes: 0 %
LYMPHS: 26 %
Lymphocytes Absolute: 1.2 10*3/uL (ref 0.7–3.1)
MCH: 31.8 pg (ref 26.6–33.0)
MCHC: 33.3 g/dL (ref 31.5–35.7)
MCV: 96 fL (ref 79–97)
Monocytes Absolute: 0.5 10*3/uL (ref 0.1–0.9)
Monocytes: 10 %
NEUTROS PCT: 62 %
Neutrophils Absolute: 2.9 10*3/uL (ref 1.4–7.0)
Platelets: 254 10*3/uL (ref 150–379)
RBC: 4.4 x10E6/uL (ref 3.77–5.28)
RDW: 13.3 % (ref 12.3–15.4)
WBC: 4.8 10*3/uL (ref 3.4–10.8)

## 2016-06-11 LAB — COMPREHENSIVE METABOLIC PANEL
A/G RATIO: 1.6 (ref 1.2–2.2)
ALBUMIN: 4.5 g/dL (ref 3.5–5.5)
ALT: 13 IU/L (ref 0–32)
AST: 13 IU/L (ref 0–40)
Alkaline Phosphatase: 69 IU/L (ref 39–117)
BILIRUBIN TOTAL: 0.4 mg/dL (ref 0.0–1.2)
BUN / CREAT RATIO: 18 (ref 9–23)
BUN: 16 mg/dL (ref 6–20)
CALCIUM: 9.7 mg/dL (ref 8.7–10.2)
CHLORIDE: 102 mmol/L (ref 96–106)
CO2: 25 mmol/L (ref 18–29)
Creatinine, Ser: 0.91 mg/dL (ref 0.57–1.00)
GFR, EST AFRICAN AMERICAN: 98 mL/min/{1.73_m2} (ref >59)
GFR, EST NON AFRICAN AMERICAN: 85 mL/min/{1.73_m2} (ref >59)
Globulin, Total: 2.9 g/dL (ref 1.5–4.5)
Glucose: 101 mg/dL — ABNORMAL HIGH (ref 65–99)
POTASSIUM: 4.7 mmol/L (ref 3.5–5.2)
Sodium: 140 mmol/L (ref 134–144)
Total Protein: 7.4 g/dL (ref 6.0–8.5)

## 2016-06-11 LAB — BABESIA MICROTI ANTIBODY PANEL
Babesia microti IgG: <1:10 {titer}
Babesia microti IgM: <1:10 {titer}

## 2016-06-11 LAB — ROCKY MTN SPOTTED FVR ABS PNL(IGG+IGM)
RMSF IGG: NEGATIVE
RMSF IgM: 0.44 index (ref 0.00–0.89)

## 2016-06-11 LAB — LYME AB/WESTERN BLOT REFLEX

## 2016-06-11 LAB — EHRLICHIA ANTIBODY PANEL
E. CHAFFEENSIS IGG AB: NEGATIVE
E. Chaffeensis (HME) IgM Titer: NEGATIVE
HGE IGM TITER: NEGATIVE
HGE IgG Titer: NEGATIVE

## 2016-06-11 LAB — MONONUCLEOSIS SCREEN: Mono Screen: NEGATIVE

## 2016-06-11 LAB — TSH: TSH: 1.55 u[IU]/mL (ref 0.450–4.500)

## 2016-06-11 LAB — VITAMIN D 25 HYDROXY (VIT D DEFICIENCY, FRACTURES): Vit D, 25-Hydroxy: 29.1 ng/mL — ABNORMAL LOW (ref 30.0–100.0)

## 2016-06-11 NOTE — Telephone Encounter (Signed)
error 

## 2016-06-28 ENCOUNTER — Ambulatory Visit: Payer: Managed Care, Other (non HMO) | Admitting: Family Medicine

## 2016-08-29 ENCOUNTER — Other Ambulatory Visit: Payer: Self-pay | Admitting: Family Medicine

## 2016-08-29 NOTE — Telephone Encounter (Signed)
Your patient 

## 2016-08-30 ENCOUNTER — Telehealth: Payer: Self-pay | Admitting: Family Medicine

## 2016-08-30 NOTE — Telephone Encounter (Signed)
Called patient to schedule an appointment.  She is going to try to go to a walk-in   [name]

## 2016-08-30 NOTE — Telephone Encounter (Signed)
Routing to provider. Patient was last seen in October.

## 2016-08-30 NOTE — Telephone Encounter (Signed)
Could you call and schedule this patient an appointment  ?

## 2016-08-30 NOTE — Telephone Encounter (Signed)
Needs an appointment.

## 2016-08-30 NOTE — Telephone Encounter (Signed)
Patient feels she may have a sinus infection, waking up with drainage in the mornings.  She would like Dr Shela CommonsJ to call her in something for this if possible.  Thank You Lori BraunKaren

## 2017-01-24 ENCOUNTER — Ambulatory Visit (INDEPENDENT_AMBULATORY_CARE_PROVIDER_SITE_OTHER): Payer: Managed Care, Other (non HMO) | Admitting: Family Medicine

## 2017-01-24 ENCOUNTER — Encounter: Payer: Self-pay | Admitting: Family Medicine

## 2017-01-24 VITALS — BP 109/75 | HR 73 | Temp 98.3°F | Ht 66.5 in | Wt 209.2 lb

## 2017-01-24 DIAGNOSIS — R635 Abnormal weight gain: Secondary | ICD-10-CM | POA: Diagnosis not present

## 2017-01-24 DIAGNOSIS — R5383 Other fatigue: Secondary | ICD-10-CM

## 2017-01-24 LAB — BAYER DCA HB A1C WAIVED: HB A1C (BAYER DCA - WAIVED): 5.1 % (ref <7.0)

## 2017-01-24 NOTE — Progress Notes (Signed)
BP 109/75 (BP Location: Left Arm, Patient Position: Sitting, Cuff Size: Normal)   Pulse 73   Temp 98.3 F (36.8 C)   Ht 5' 6.5" (1.689 m)   Wt 209 lb 3.2 oz (94.9 kg)   SpO2 99%   BMI 33.26 kg/m    Subjective:    Patient ID: [name], female    DOB: 06-Jul-1986, 31 y.o.   MRN: [id number]  HPI: [name] is a 31 y.o. female  Chief Complaint  Patient presents with  . Weight Gain  . Fatigue   WEIGHT GAIN- has gained 15 lbs in the last 7 months, weight reviewed, has not been below 189 since 2013, Got out of a really bad relationship in August, and has gained weight since then. Off SSRI Duration: 9 months Previous attempts at weight loss: yes, has been exercising, drinking water, trying to eat better Complications of obesity: none  Peak weight: current  FATIGUE Duration:  Since January Severity: severe  Onset: gradual Context when symptoms started:  unknown Symptoms improve with rest: no  Depressive symptoms: no Stress/anxiety: no Insomnia: yes  Snoring: yes Observed apnea by bed partner: yes Daytime hypersomnolence:yes Wakes feeling refreshed: no History of sleep study: no Dysnea on exertion:  no Orthopnea/PND: no Chest pain: no Chronic cough: no Lower extremity edema: yes Arthralgias:no Myalgias: no Weakness: no Rash: no  Relevant past medical, surgical, family and social history reviewed and updated as indicated. Interim medical history since our last visit reviewed. Allergies and medications reviewed and updated.  Review of Systems  Constitutional: Positive for fatigue and unexpected weight change. Negative for activity change, appetite change, chills, diaphoresis and fever.  Respiratory: Negative.   Cardiovascular: Negative.   Gastrointestinal: Negative.   Musculoskeletal: Negative.   Psychiatric/Behavioral: Negative.     Per HPI unless specifically indicated above     Objective:    BP 109/75 (BP Location: Left Arm, Patient  Position: Sitting, Cuff Size: Normal)   Pulse 73   Temp 98.3 F (36.8 C)   Ht 5' 6.5" (1.689 m)   Wt 209 lb 3.2 oz (94.9 kg)   SpO2 99%   BMI 33.26 kg/m   Wt Readings from Last 3 Encounters:  01/24/17 209 lb 3.2 oz (94.9 kg)  06/07/16 195 lb (88.5 kg)  05/10/16 193 lb (87.5 kg)    Physical Exam  Constitutional: She is oriented to person, place, and time. She appears well-developed and well-nourished. No distress.  HENT:  Head: Normocephalic and atraumatic.  Right Ear: Hearing normal.  Left Ear: Hearing normal.  Nose: Nose normal.  Eyes: Conjunctivae and lids are normal. Right eye exhibits no discharge. Left eye exhibits no discharge. No scleral icterus.  Cardiovascular: Normal rate, regular rhythm and intact distal pulses.  Exam reveals no gallop and no friction rub.   No murmur heard. Pulmonary/Chest: Effort normal and breath sounds normal. No respiratory distress. She has no wheezes. She has no rales. She exhibits no tenderness.  Musculoskeletal: Normal range of motion.  Neurological: She is alert and oriented to person, place, and time.  Skin: Skin is warm, dry and intact. No rash noted. She is not diaphoretic. No erythema. No pallor.  Psychiatric: She has a normal mood and affect. Her speech is normal and behavior is normal. Judgment and thought content normal. Cognition and memory are normal.  Nursing note and vitals reviewed.   Results for orders placed or performed in visit on 06/07/16  CBC with Differential/Platelet  Result Value Ref  Range   WBC 4.8 3.4 - 10.8 x10E3/uL   RBC 4.40 3.77 - 5.28 x10E6/uL   Hemoglobin 14.0 11.1 - 15.9 g/dL   Hematocrit 16.1 09.6 - 46.6 %   MCV 96 79 - 97 fL   MCH 31.8 26.6 - 33.0 pg   MCHC 33.3 31.5 - 35.7 g/dL   RDW 04.5 40.9 - 81.1 %   Platelets 254 150 - 379 x10E3/uL   Neutrophils 62 Not Estab. %   Lymphs 26 Not Estab. %   Monocytes 10 Not Estab. %   Eos 2 Not Estab. %   Basos 0 Not Estab. %   Neutrophils Absolute 2.9 1.4 - 7.0  x10E3/uL   Lymphocytes Absolute 1.2 0.7 - 3.1 x10E3/uL   Monocytes Absolute 0.5 0.1 - 0.9 x10E3/uL   EOS (ABSOLUTE) 0.1 0.0 - 0.4 x10E3/uL   Basophils Absolute 0.0 0.0 - 0.2 x10E3/uL   Immature Granulocytes 0 Not Estab. %   Immature Grans (Abs) 0.0 0.0 - 0.1 x10E3/uL  Comprehensive metabolic panel  Result Value Ref Range   Glucose 101 (H) 65 - 99 mg/dL   BUN 16 6 - 20 mg/dL   Creatinine, Ser 9.14 0.57 - 1.00 mg/dL   GFR calc non Af Amer 85 >59 mL/min/1.73   GFR calc Af Amer 98 >59 mL/min/1.73   BUN/Creatinine Ratio 18 9 - 23   Sodium 140 134 - 144 mmol/L   Potassium 4.7 3.5 - 5.2 mmol/L   Chloride 102 96 - 106 mmol/L   CO2 25 18 - 29 mmol/L   Calcium 9.7 8.7 - 10.2 mg/dL   Total Protein 7.4 6.0 - 8.5 g/dL   Albumin 4.5 3.5 - 5.5 g/dL   Globulin, Total 2.9 1.5 - 4.5 g/dL   Albumin/Globulin Ratio 1.6 1.2 - 2.2   Bilirubin Total 0.4 0.0 - 1.2 mg/dL   Alkaline Phosphatase 69 39 - 117 IU/L   AST 13 0 - 40 IU/L   ALT 13 0 - 32 IU/L  TSH  Result Value Ref Range   TSH 1.550 0.450 - 4.500 uIU/mL  Mononucleosis screen  Result Value Ref Range   Mono Screen Negative Negative  VITAMIN D 25 Hydroxy (Vit-D Deficiency, Fractures)  Result Value Ref Range   Vit D, 25-Hydroxy 29.1 (L) 30.0 - 100.0 ng/mL  Rocky mtn spotted fvr abs pnl(IgG+IgM)  Result Value Ref Range   RMSF IgG Negative Negative   RMSF IgM 0.44 0.00 - 0.89 index  Lyme Ab/Western Blot Reflex  Result Value Ref Range   Lyme IgG/IgM Ab <0.91 0.00 - 0.90 ISR   LYME DISEASE AB, QUANT, IGM <0.80 0.00 - 0.79 index  Babesia microti Antibody Panel  Result Value Ref Range   Babesia microti IgM <1:10 Neg:<1:10   Babesia microti IgG <1:10 Neg:<1:10  Ehrlichia Antibody Panel  Result Value Ref Range   E.Chaffeensis (HME) IgG Negative Neg:<1:64   E. Chaffeensis (HME) IgM Titer Negative Neg:<1:20   HGE IgG Titer Negative Neg:<1:64   HGE IgM Titer Negative Neg:<1:20      Assessment & Plan:   Problem List Items Addressed This  Visit    None    Visit Diagnoses    Weight gain    -  Primary   Concern for OSA- will check labs, if normal, may need sleep study- await results.    Relevant Orders   Bayer DCA Hb A1c Waived   CBC with Differential/Platelet   Comprehensive metabolic panel   Lipid Panel w/o Chol/HDL Ratio  Thyroid Panel With TSH   Other fatigue       Concern for OSA- will check labs, if normal, may need sleep study- await results.    Relevant Orders   Lyme Ab/Western Blot Reflex   Rocky mtn spotted fvr abs pnl(IgG+IgM)       Follow up plan: Return Pending results.

## 2017-01-25 LAB — CBC WITH DIFFERENTIAL/PLATELET
BASOS ABS: 0 10*3/uL (ref 0.0–0.2)
Basos: 1 %
EOS (ABSOLUTE): 0.1 10*3/uL (ref 0.0–0.4)
Eos: 1 %
Hematocrit: 39.8 % (ref 34.0–46.6)
Hemoglobin: 13.4 g/dL (ref 11.1–15.9)
IMMATURE GRANS (ABS): 0 10*3/uL (ref 0.0–0.1)
Immature Granulocytes: 0 %
LYMPHS ABS: 1.4 10*3/uL (ref 0.7–3.1)
LYMPHS: 25 %
MCH: 31.6 pg (ref 26.6–33.0)
MCHC: 33.7 g/dL (ref 31.5–35.7)
MCV: 94 fL (ref 79–97)
MONOS ABS: 0.5 10*3/uL (ref 0.1–0.9)
Monocytes: 10 %
NEUTROS ABS: 3.5 10*3/uL (ref 1.4–7.0)
Neutrophils: 63 %
PLATELETS: 239 10*3/uL (ref 150–379)
RBC: 4.24 x10E6/uL (ref 3.77–5.28)
RDW: 12.8 % (ref 12.3–15.4)
WBC: 5.5 10*3/uL (ref 3.4–10.8)

## 2017-01-25 LAB — COMPREHENSIVE METABOLIC PANEL
A/G RATIO: 1.6 (ref 1.2–2.2)
ALK PHOS: 73 IU/L (ref 39–117)
ALT: 15 IU/L (ref 0–32)
AST: 15 IU/L (ref 0–40)
Albumin: 4.4 g/dL (ref 3.5–5.5)
BILIRUBIN TOTAL: 0.4 mg/dL (ref 0.0–1.2)
BUN/Creatinine Ratio: 15 (ref 9–23)
BUN: 13 mg/dL (ref 6–20)
CHLORIDE: 106 mmol/L (ref 96–106)
CO2: 24 mmol/L (ref 18–29)
Calcium: 9.7 mg/dL (ref 8.7–10.2)
Creatinine, Ser: 0.87 mg/dL (ref 0.57–1.00)
GFR calc Af Amer: 103 mL/min/{1.73_m2} (ref >59)
GFR calc non Af Amer: 89 mL/min/{1.73_m2} (ref >59)
GLUCOSE: 102 mg/dL — AB (ref 65–99)
Globulin, Total: 2.8 g/dL (ref 1.5–4.5)
POTASSIUM: 5.5 mmol/L — AB (ref 3.5–5.2)
Sodium: 139 mmol/L (ref 134–144)
Total Protein: 7.2 g/dL (ref 6.0–8.5)

## 2017-01-25 LAB — THYROID PANEL WITH TSH
Free Thyroxine Index: 1.8 (ref 1.2–4.9)
T3 Uptake Ratio: 24 % (ref 24–39)
T4, Total: 7.3 ug/dL (ref 4.5–12.0)
TSH: 2.88 u[IU]/mL (ref 0.450–4.500)

## 2017-01-25 LAB — LIPID PANEL W/O CHOL/HDL RATIO
CHOLESTEROL TOTAL: 176 mg/dL (ref 100–199)
HDL: 48 mg/dL (ref >39)
LDL Calculated: 116 mg/dL — ABNORMAL HIGH (ref 0–99)
Triglycerides: 62 mg/dL (ref 0–149)
VLDL CHOLESTEROL CAL: 12 mg/dL (ref 5–40)

## 2017-01-28 ENCOUNTER — Telehealth: Payer: Self-pay | Admitting: Family Medicine

## 2017-01-28 LAB — ROCKY MTN SPOTTED FVR ABS PNL(IGG+IGM)
RMSF IGM: 0.52 {index} (ref 0.00–0.89)
RMSF IgG: NEGATIVE

## 2017-01-28 LAB — LYME AB/WESTERN BLOT REFLEX

## 2017-01-28 NOTE — Telephone Encounter (Signed)
Patient would like to speak to someone regarding the labs from last weeks appt.   Thank you   [phone]

## 2017-01-28 NOTE — Telephone Encounter (Signed)
Spoke with patient, she would like lab results.  Also she would like to try the medication that may help her with weight loss.

## 2017-01-28 NOTE — Telephone Encounter (Signed)
Called patient, no answer, unable to leave a message. Will try again.  

## 2017-01-29 NOTE — Telephone Encounter (Signed)
Pt notified about medication for weight loss and would like a call back regarding lab results when they come back.

## 2017-01-29 NOTE — Telephone Encounter (Signed)
No medication for weight loss right now. She needs to work on diet and exercise, and we'll follow up in 1-2 months and if she hasn't lost any weight, we'll talk about it

## 2017-04-07 ENCOUNTER — Encounter: Payer: Self-pay | Admitting: Medical Oncology

## 2017-04-07 ENCOUNTER — Emergency Department: Payer: Managed Care, Other (non HMO)

## 2017-04-07 ENCOUNTER — Emergency Department
Admission: EM | Admit: 2017-04-07 | Discharge: 2017-04-07 | Disposition: A | Discharge status: Home / Self Care | Payer: Managed Care, Other (non HMO) | Attending: Student in an Organized Health Care Education/Training Program | Admitting: Student in an Organized Health Care Education/Training Program

## 2017-04-07 ENCOUNTER — Ambulatory Visit (INDEPENDENT_AMBULATORY_CARE_PROVIDER_SITE_OTHER): Payer: Managed Care, Other (non HMO) | Admitting: Family Medicine

## 2017-04-07 ENCOUNTER — Encounter: Payer: Self-pay | Admitting: Family Medicine

## 2017-04-07 VITALS — BP 110/74 | HR 84 | Temp 98.8°F | Wt 207.0 lb

## 2017-04-07 DIAGNOSIS — R1031 Right lower quadrant pain: Secondary | ICD-10-CM

## 2017-04-07 DIAGNOSIS — R3129 Other microscopic hematuria: Secondary | ICD-10-CM | POA: Insufficient documentation

## 2017-04-07 LAB — CBC
HCT: 40.2 % (ref 35.0–47.0)
Hemoglobin: 14.2 g/dL (ref 12.0–16.0)
MCH: 32.5 pg (ref 26.0–34.0)
MCHC: 35.3 g/dL (ref 32.0–36.0)
MCV: 92 fL (ref 80.0–100.0)
PLATELETS: 262 10*3/uL (ref 150–440)
RBC: 4.37 MIL/uL (ref 3.80–5.20)
RDW: 12.9 % (ref 11.5–14.5)
WBC: 6.6 10*3/uL (ref 3.6–11.0)

## 2017-04-07 LAB — URINALYSIS, COMPLETE (UACMP) WITH MICROSCOPIC
BACTERIA UA: NONE SEEN
Bilirubin Urine: NEGATIVE
Glucose, UA: NEGATIVE mg/dL
Ketones, ur: NEGATIVE mg/dL
Nitrite: NEGATIVE
PROTEIN: NEGATIVE mg/dL
Specific Gravity, Urine: >1.046 — ABNORMAL HIGH (ref 1.005–1.030)
pH: 5 (ref 5.0–8.0)

## 2017-04-07 LAB — COMPREHENSIVE METABOLIC PANEL
ALT: 16 U/L (ref 14–54)
AST: 18 U/L (ref 15–41)
Albumin: 4.3 g/dL (ref 3.5–5.0)
Alkaline Phosphatase: 70 U/L (ref 38–126)
Anion gap: 9 (ref 5–15)
BUN: 13 mg/dL (ref 6–20)
CHLORIDE: 105 mmol/L (ref 101–111)
CO2: 24 mmol/L (ref 22–32)
CREATININE: 0.72 mg/dL (ref 0.44–1.00)
Calcium: 9.3 mg/dL (ref 8.9–10.3)
GFR calc Af Amer: >60 mL/min (ref >60)
GFR calc non Af Amer: >60 mL/min (ref >60)
GLUCOSE: 109 mg/dL — AB (ref 65–99)
Potassium: 3.7 mmol/L (ref 3.5–5.1)
SODIUM: 138 mmol/L (ref 135–145)
Total Bilirubin: 0.6 mg/dL (ref 0.3–1.2)
Total Protein: 7.6 g/dL (ref 6.5–8.1)

## 2017-04-07 LAB — LIPASE, BLOOD: LIPASE: 27 U/L (ref 11–51)

## 2017-04-07 LAB — HCG, QUANTITATIVE, PREGNANCY: HCG, BETA CHAIN, QUANT, S: 1 m[IU]/mL (ref <5)

## 2017-04-07 MED ORDER — FENTANYL CITRATE (PF) 100 MCG/2ML IJ SOLN
100.0000 ug | INTRAMUSCULAR | Status: DC | PRN
  Administered 2017-04-07: 100 ug via INTRAVENOUS
  Filled 2017-04-07: qty 2

## 2017-04-07 MED ORDER — SODIUM CHLORIDE 0.9 % IV BOLUS (SEPSIS)
1000.0000 mL | Freq: Once | INTRAVENOUS | Status: AC
  Administered 2017-04-07: 1000 mL via INTRAVENOUS

## 2017-04-07 MED ORDER — KETOROLAC TROMETHAMINE 30 MG/ML IJ SOLN
15.0000 mg | Freq: Once | INTRAMUSCULAR | Status: AC
  Administered 2017-04-07: 15 mg via INTRAVENOUS
  Filled 2017-04-07: qty 1

## 2017-04-07 MED ORDER — MORPHINE SULFATE (PF) 4 MG/ML IV SOLN
4.0000 mg | INTRAVENOUS | Status: DC | PRN
  Administered 2017-04-07: 4 mg via INTRAVENOUS
  Filled 2017-04-07: qty 1

## 2017-04-07 MED ORDER — DICYCLOMINE HCL 10 MG PO CAPS: 10.0000 mg | ORAL_CAPSULE | Freq: Three times a day (TID) | ORAL | 0 refills | Status: DC | PRN

## 2017-04-07 MED ORDER — ONDANSETRON HCL 4 MG/2ML IJ SOLN
4.0000 mg | Freq: Once | INTRAMUSCULAR | Status: AC
  Administered 2017-04-07: 4 mg via INTRAVENOUS
  Filled 2017-04-07: qty 2

## 2017-04-07 MED ORDER — PROMETHAZINE HCL 12.5 MG PO TABS: 12.5000 mg | ORAL_TABLET | Freq: Four times a day (QID) | ORAL | 0 refills | Status: DC | PRN

## 2017-04-07 MED ORDER — HALOPERIDOL LACTATE 5 MG/ML IJ SOLN
5.0000 mg | Freq: Once | INTRAMUSCULAR | Status: AC
  Administered 2017-04-07: 5 mg via INTRAVENOUS

## 2017-04-07 MED ORDER — IOPAMIDOL (ISOVUE-300) INJECTION 61%
100.0000 mL | Freq: Once | INTRAVENOUS | Status: AC | PRN
  Administered 2017-04-07: 100 mL via INTRAVENOUS
  Filled 2017-04-07: qty 100

## 2017-04-07 MED ORDER — HALOPERIDOL LACTATE 5 MG/ML IJ SOLN
INTRAMUSCULAR | Status: AC
  Filled 2017-04-07: qty 1

## 2017-04-07 NOTE — ED Triage Notes (Signed)
Pt reports RLQ pain that began 12 hrs ago.

## 2017-04-07 NOTE — Discharge Instructions (Signed)

## 2017-04-07 NOTE — ED Provider Notes (Addendum)
[facility] Emergency Department Provider Note    First MD Initiated Contact with Patient 04/07/17 1458     (approximate)  I have reviewed the triage vital signs and the nursing notes.   HISTORY  Chief Complaint Abdominal Pain    HPI [name] is a 31 y.o. female presents with chief complaint of right lower quadrant abdominal pain associated with chills nausea and decreased appetite. Patient states the pain started yesterday but did start feeling like she was having some sort of GI illness 2 days ago. Is passing gas. Denies any dysuria. No flank pain. States the pain is 10 out of 10 in severity. Does not had any pain similar to this in the past. Denies any chance of being pregnant. Denies any vaginal discharge or bleeding.   Past Medical History:  Diagnosis Date  . Abnormal Pap smear   . Anxiety   . Complication of anesthesia    N/V, passing out with epidural  . Gestational diabetes   . Headache(784.0)   . Ovarian cyst   . Preterm labor   . Seizures (HCC)   . Urinary tract infection    Family History  Problem Relation Age of Onset  . Hyperthyroidism Father   . Stroke Father   . Hypertension Father   . Hypothyroidism Father   . Hypothyroidism Mother   . Cancer Mother        pancreatic  . Cancer Paternal Grandfather        leukemia  . Multiple sclerosis Paternal Aunt   . Diabetes Maternal Grandmother   . COPD Maternal Grandfather   . Stroke Paternal Grandmother   . Alzheimer's disease Paternal Grandmother   . Heart disease Neg Hx    Past Surgical History:  Procedure Laterality Date  . repair of fr left foot    . THERAPEUTIC ABORTION    . TUBAL LIGATION  04/21/2011   Procedure: POST PARTUM TUBAL LIGATION;  Surgeon: Tereso Newcomer, MD;  Location: [facility];  Service: Gynecology;  Laterality: Bilateral;  Bilateral post partum tubal ligation with filshie clips.  . tubalectomy Bilateral 2014   Patient Active Problem List   Diagnosis Date Noted  . Acute anxiety 05/10/2016  . Insomnia 05/10/2016  . Heavy menstrual bleeding 03/29/2015  . Low grade squamous intraepithelial lesion (LGSIL) on Papanicolaou smear of cervix 09/29/2013      Prior to Admission medications   Medication Sig Start Date End Date Taking? Authorizing Provider  dicyclomine (BENTYL) 10 MG capsule Take 1 capsule (10 mg total) by mouth 3 (three) times daily as needed for spasms. 04/07/17 04/21/17  Willy Eddy, MD  promethazine (PHENERGAN) 12.5 MG tablet Take 1 tablet (12.5 mg total) by mouth every 6 (six) hours as needed for nausea or vomiting. 04/07/17   Willy Eddy, MD    Allergies Patient has no known allergies.    Social History Social History  Substance Use Topics  . Smoking status: Never Smoker  . Smokeless tobacco: Never Used  . Alcohol use Yes     Comment: occasion    Review of Systems Patient denies headaches, rhinorrhea, blurry vision, numbness, shortness of breath, chest pain, edema, cough, abdominal pain, nausea, vomiting, diarrhea, dysuria, fevers, rashes or hallucinations unless otherwise stated above in HPI. ____________________________________________   PHYSICAL EXAM:  VITAL SIGNS: Vitals:   04/07/17 1755  BP: 121/71  Pulse: 100  Resp: 18    Constitutional: Alert and oriented.  in no acute distress. Eyes: Conjunctivae are normal.  Head: Atraumatic. Nose: No congestion/rhinnorhea. Mouth/Throat: Mucous membranes are moist.   Neck: No stridor. Painless ROM.  Cardiovascular: Normal rate, regular rhythm. Grossly normal heart sounds.  Good peripheral circulation. Respiratory: Normal respiratory effort.  No retractions. Lungs CTAB. Gastrointestinal: Soft , mildly obese with ttp ro RLQ. No distention. No abdominal bruits. No CVA tenderness. Genitourinary:  Musculoskeletal: No lower extremity tenderness nor edema.  No joint effusions. Neurologic:  Normal speech and language. No gross focal neurologic  deficits are appreciated. No facial droop Skin:  Skin is warm, dry and intact. No rash noted. Psychiatric: Mood and affect are normal. Speech and behavior are normal.  ____________________________________________   LABS (all labs ordered are listed, but only abnormal results are displayed)  Results for orders placed or performed during the hospital encounter of 04/07/17 (from the past 24 hour(s))  Lipase, blood     Status: None   Collection Time: 04/07/17  2:50 PM  Result Value Ref Range   Lipase 27 11 - 51 U/L  Comprehensive metabolic panel     Status: Abnormal   Collection Time: 04/07/17  2:50 PM  Result Value Ref Range   Sodium 138 135 - 145 mmol/L   Potassium 3.7 3.5 - 5.1 mmol/L   Chloride 105 101 - 111 mmol/L   CO2 24 22 - 32 mmol/L   Glucose, Bld 109 (H) 65 - 99 mg/dL   BUN 13 6 - 20 mg/dL   Creatinine, Ser 1.610.72 0.44 - 1.00 mg/dL   Calcium 9.3 8.9 - 09.610.3 mg/dL   Total Protein 7.6 6.5 - 8.1 g/dL   Albumin 4.3 3.5 - 5.0 g/dL   AST 18 15 - 41 U/L   ALT 16 14 - 54 U/L   Alkaline Phosphatase 70 38 - 126 U/L   Total Bilirubin 0.6 0.3 - 1.2 mg/dL   GFR calc non Af Amer >60 >60 mL/min   GFR calc Af Amer >60 >60 mL/min   Anion gap 9 5 - 15  CBC     Status: None   Collection Time: 04/07/17  2:50 PM  Result Value Ref Range   WBC 6.6 3.6 - 11.0 K/uL   RBC 4.37 3.80 - 5.20 MIL/uL   Hemoglobin 14.2 12.0 - 16.0 g/dL   HCT 04.540.2 40.935.0 - 81.147.0 %   MCV 92.0 80.0 - 100.0 fL   MCH 32.5 26.0 - 34.0 pg   MCHC 35.3 32.0 - 36.0 g/dL   RDW 91.412.9 78.211.5 - 95.614.5 %   Platelets 262 150 - 440 K/uL  Urinalysis, Complete w Microscopic     Status: Abnormal   Collection Time: 04/07/17  2:50 PM  Result Value Ref Range   Color, Urine STRAW (A) YELLOW   APPearance CLEAR (A) CLEAR   Specific Gravity, Urine >1.046 (H) 1.005 - 1.030   pH 5.0 5.0 - 8.0   Glucose, UA NEGATIVE NEGATIVE mg/dL   Hgb urine dipstick MODERATE (A) NEGATIVE   Bilirubin Urine NEGATIVE NEGATIVE   Ketones, ur NEGATIVE NEGATIVE  mg/dL   Protein, ur NEGATIVE NEGATIVE mg/dL   Nitrite NEGATIVE NEGATIVE   Leukocytes, UA TRACE (A) NEGATIVE   RBC / HPF 6-30 0 - 5 RBC/hpf   WBC, UA 0-5 0 - 5 WBC/hpf   Bacteria, UA NONE SEEN NONE SEEN   Squamous Epithelial / LPF 6-30 (A) NONE SEEN  hCG, quantitative, pregnancy     Status: None   Collection Time: 04/07/17  2:50 PM  Result Value Ref Range   hCG, Beta  Chain, Quant, S 1 <5 mIU/mL   ____________________________________________ ____________________________________________  RADIOLOGY  I personally reviewed all radiographic images ordered to evaluate for the above acute complaints and reviewed radiology reports and findings.  These findings were personally discussed with the patient.  Please see medical record for radiology report.  ____________________________________________   PROCEDURES  Procedure(s) performed:  Procedures    Critical Care performed: no ____________________________________________   INITIAL IMPRESSION / ASSESSMENT AND PLAN / ED COURSE  Pertinent labs & imaging results that were available during my care of the patient were reviewed by me and considered in my medical decision making (see chart for details).  DDX: appendicitis, colitis, ibd, uti, stone, hernia, cyst  [name] is a 31 y.o. who presents to the ED with Acute right lower quadrant abdominal pain as described above. Patient afebrile but is very uncomfortable appearing. Lab work and CT imaging ordered for the above differential.  Patient will be provided IV fluids as well as IV pain medication.  The patient will be placed on continuous pulse oximetry and telemetry for monitoring.  Laboratory evaluation will be sent to evaluate for the above complaints.     Clinical Course as of Apr 07 2146  [facility] Apr 07, 2017  1710 Patient reassessed and is feeling much improved. Currently awaiting results of urinalysis. Currently does not have any evidence of appendicitis. Repeat abdominal  exam soft and benign.  [PR]  1759 Urinalysis does show evidence of hematuria. Patient's pain is now resolved. Possible kidney stone causing pain but saw nothing to suggest an obstructing stone on CT imaging. Patient is tolerating oral hydration and remains hemodynamic stable. Do feel patient is appropriate for follow-up with her PCP.  [PR]    Clinical Course User Index [PR] Willy Eddy, MD     ____________________________________________   FINAL CLINICAL IMPRESSION(S) / ED DIAGNOSES  Final diagnoses:  RLQ abdominal pain  Other microscopic hematuria      NEW MEDICATIONS STARTED DURING THIS VISIT:  Discharge Medication List as of 04/07/2017  5:51 PM    START taking these medications   Details  dicyclomine (BENTYL) 10 MG capsule Take 1 capsule (10 mg total) by mouth 3 (three) times daily as needed for spasms., Starting Mon 04/07/2017, Until Mon 04/21/2017, Print    promethazine (PHENERGAN) 12.5 MG tablet Take 1 tablet (12.5 mg total) by mouth every 6 (six) hours as needed for nausea or vomiting., Starting Mon 04/07/2017, Print         Note:  This document was prepared using Dragon voice recognition software and may include unintentional dictation errors.    Willy Eddy, MD 04/07/17 1610    Willy Eddy, MD 04/07/17 2147

## 2017-04-07 NOTE — Progress Notes (Signed)
BP 110/74   Pulse 84   Temp 98.8 F (37.1 C)   Wt 207 lb (93.9 kg)   LMP 03/25/2017   SpO2 100%   BMI 32.91 kg/m    Subjective:    Patient ID: [name], female    DOB: 05-23-1986, 31 y.o.   MRN: [id number]  HPI: [name] is a 31 y.o. female  Chief Complaint  Patient presents with  . Abdominal Pain    RLQ. started feeling like she had a stomach virus on Sat. Had N/V/D. Hurts to have a bowel movement. Worsening over the last 12 hours.   Patient presents today with N/V/D, anorexia, and chills that started about 4 days ago. Sxs have worsened and now localized to RLQ with persistent pain that pt rates 12/10. Has not eaten since onset 4 days ago. Unsure of fevers, but has had chills. Movement makes pain worse. Has not tried anything for sxs.   Relevant past medical, surgical, family and social history reviewed and updated as indicated. Interim medical history since our last visit reviewed. Allergies and medications reviewed and updated.  Review of Systems  Constitutional: Positive for chills.  HENT: Negative.   Respiratory: Negative.   Cardiovascular: Negative.   Gastrointestinal: Positive for abdominal pain, diarrhea, nausea and vomiting.  Genitourinary: Negative.   Musculoskeletal: Negative.   Skin: Negative.   Neurological: Negative.    Per HPI unless specifically indicated above     Objective:    BP 110/74   Pulse 84   Temp 98.8 F (37.1 C)   Wt 207 lb (93.9 kg)   LMP 03/25/2017   SpO2 100%   BMI 32.91 kg/m   Wt Readings from Last 3 Encounters:  04/07/17 207 lb (93.9 kg)  04/07/17 207 lb (93.9 kg)  01/24/17 209 lb 3.2 oz (94.9 kg)    Physical Exam  Constitutional: She appears well-developed and well-nourished. She appears distressed (Pt laying on left side in fetal position on exam table, tearful).  HENT:  Head: Atraumatic.  Eyes: Pupils are equal, round, and reactive to light. Conjunctivae are normal.  Neck: Normal range of motion. Neck  supple.  Cardiovascular: Normal rate.   Pulmonary/Chest: Effort normal and breath sounds normal. No respiratory distress.  Abdominal: Bowel sounds are normal. She exhibits no distension. There is tenderness (Exquisitely tender to light palpation in periumbilical and RLQ. Pt unable to tolerate deep palpation - exam limited by this).  Neurological: She is alert.  Skin: Skin is warm and dry.  Psychiatric: She has a normal mood and affect. Her behavior is normal.  Nursing note and vitals reviewed.  Results for orders placed or performed in visit on 01/24/17  Bayer DCA Hb A1c Waived  Result Value Ref Range   Bayer DCA Hb A1c Waived 5.1 <7.0 %  CBC with Differential/Platelet  Result Value Ref Range   WBC 5.5 3.4 - 10.8 x10E3/uL   RBC 4.24 3.77 - 5.28 x10E6/uL   Hemoglobin 13.4 11.1 - 15.9 g/dL   Hematocrit 40.9 81.1 - 46.6 %   MCV 94 79 - 97 fL   MCH 31.6 26.6 - 33.0 pg   MCHC 33.7 31.5 - 35.7 g/dL   RDW 91.4 78.2 - 95.6 %   Platelets 239 150 - 379 x10E3/uL   Neutrophils 63 Not Estab. %   Lymphs 25 Not Estab. %   Monocytes 10 Not Estab. %   Eos 1 Not Estab. %   Basos 1 Not Estab. %  Neutrophils Absolute 3.5 1.4 - 7.0 x10E3/uL   Lymphocytes Absolute 1.4 0.7 - 3.1 x10E3/uL   Monocytes Absolute 0.5 0.1 - 0.9 x10E3/uL   EOS (ABSOLUTE) 0.1 0.0 - 0.4 x10E3/uL   Basophils Absolute 0.0 0.0 - 0.2 x10E3/uL   Immature Granulocytes 0 Not Estab. %   Immature Grans (Abs) 0.0 0.0 - 0.1 x10E3/uL  Comprehensive metabolic panel  Result Value Ref Range   Glucose 102 (H) 65 - 99 mg/dL   BUN 13 6 - 20 mg/dL   Creatinine, Ser 5.620.87 0.57 - 1.00 mg/dL   GFR calc non Af Amer 89 >59 mL/min/1.73   GFR calc Af Amer 103 >59 mL/min/1.73   BUN/Creatinine Ratio 15 9 - 23   Sodium 139 134 - 144 mmol/L   Potassium 5.5 (H) 3.5 - 5.2 mmol/L   Chloride 106 96 - 106 mmol/L   CO2 24 18 - 29 mmol/L   Calcium 9.7 8.7 - 10.2 mg/dL   Total Protein 7.2 6.0 - 8.5 g/dL   Albumin 4.4 3.5 - 5.5 g/dL   Globulin, Total  2.8 1.5 - 4.5 g/dL   Albumin/Globulin Ratio 1.6 1.2 - 2.2   Bilirubin Total 0.4 0.0 - 1.2 mg/dL   Alkaline Phosphatase 73 39 - 117 IU/L   AST 15 0 - 40 IU/L   ALT 15 0 - 32 IU/L  Lipid Panel w/o Chol/HDL Ratio  Result Value Ref Range   Cholesterol, Total 176 100 - 199 mg/dL   Triglycerides 62 0 - 149 mg/dL   HDL 48 >13>39 mg/dL   VLDL Cholesterol Cal 12 5 - 40 mg/dL   LDL Calculated 086116 (H) 0 - 99 mg/dL  Thyroid Panel With TSH  Result Value Ref Range   TSH 2.880 0.450 - 4.500 uIU/mL   T4, Total 7.3 4.5 - 12.0 ug/dL   T3 Uptake Ratio 24 24 - 39 %   Free Thyroxine Index 1.8 1.2 - 4.9  Rocky mtn spotted fvr abs pnl(IgG+IgM)  Result Value Ref Range   RMSF IgG Negative Negative   RMSF IgM 0.52 0.00 - 0.89 index  Lyme Ab/Western Blot Reflex  Result Value Ref Range   Lyme IgG/IgM Ab <0.91 0.00 - 0.90 ISR   LYME DISEASE AB, QUANT, IGM <0.80 0.00 - 0.79 index      Assessment & Plan:   Problem List Items Addressed This Visit    None    Visit Diagnoses    RLQ abdominal pain    -  Primary   Given vomiting, anorexia, and severe RLQ pain, advised pt to present to the ER for immediate evaluation and imaging to r/o appendicitis, ovarian torsion, etc    [facility] called and informed of patient's presentation and impending arrival for evaluation.    Follow up plan: Return if symptoms worsen or fail to improve.

## 2017-04-09 NOTE — Patient Instructions (Signed)
Follow up as needed

## 2017-05-06 ENCOUNTER — Telehealth: Payer: Self-pay | Admitting: Family Medicine

## 2017-05-06 NOTE — Telephone Encounter (Signed)
Scheduled patient's appointment.

## 2017-05-06 NOTE — Telephone Encounter (Signed)
Patient called regarding her weight gain. She said she continues to gain weight and is up to 224lbs. She states that Dr. Laural BenesJohnson said to call back if he continued to gain weight and she would see about starting her on a dietary substitute.  Thank You  [phone]  Phar is [facility]

## 2017-05-06 NOTE — Telephone Encounter (Signed)
Routing to provider  

## 2017-05-06 NOTE — Telephone Encounter (Signed)
Needs appointment

## 2017-05-09 ENCOUNTER — Encounter: Payer: Self-pay | Admitting: Family Medicine

## 2017-05-09 ENCOUNTER — Encounter: Payer: Self-pay | Admitting: Advanced Practice Midwife

## 2017-05-09 ENCOUNTER — Ambulatory Visit (INDEPENDENT_AMBULATORY_CARE_PROVIDER_SITE_OTHER): Payer: Managed Care, Other (non HMO) | Admitting: Family Medicine

## 2017-05-09 ENCOUNTER — Ambulatory Visit (INDEPENDENT_AMBULATORY_CARE_PROVIDER_SITE_OTHER): Payer: Managed Care, Other (non HMO) | Admitting: Advanced Practice Midwife

## 2017-05-09 ENCOUNTER — Telehealth: Payer: Self-pay | Admitting: Family Medicine

## 2017-05-09 VITALS — BP 129/81 | HR 84 | Temp 98.9°F | Wt 209.3 lb

## 2017-05-09 VITALS — BP 120/60 | HR 66 | Ht 68.0 in | Wt 209.0 lb

## 2017-05-09 DIAGNOSIS — Z124 Encounter for screening for malignant neoplasm of cervix: Secondary | ICD-10-CM

## 2017-05-09 DIAGNOSIS — R635 Abnormal weight gain: Secondary | ICD-10-CM | POA: Diagnosis not present

## 2017-05-09 DIAGNOSIS — Z01419 Encounter for gynecological examination (general) (routine) without abnormal findings: Secondary | ICD-10-CM

## 2017-05-09 MED ORDER — NALTREXONE-BUPROPION HCL ER 8-90 MG PO TB12: ORAL_TABLET | ORAL | 3 refills | Status: DC

## 2017-05-09 NOTE — Progress Notes (Signed)
BP 129/81 (BP Location: Left Arm, Patient Position: Sitting, Cuff Size: Normal)   Pulse 84   Temp 98.9 F (37.2 C)   Wt 209 lb 5 oz (94.9 kg)   SpO2 100%   BMI 33.28 kg/m    Subjective:    Patient ID: [name], female    DOB: Jul 17, 1986, 31 y.o.   MRN: [id number]  HPI: [name] is a 31 y.o. female  Chief Complaint  Patient presents with  . Weight Gain   WEIGHT GAIN Duration: about 9 months Previous attempts at weight loss: yes- diet, exercising 2 hours a day Complications of obesity: None Peak weight: 224 Weight loss goal: 180 Weight loss to date: 15lbs Requesting obesity pharmacotherapy: yes  Relevant past medical, surgical, family and social history reviewed and updated as indicated. Interim medical history since our last visit reviewed. Allergies and medications reviewed and updated.  Review of Systems  Constitutional: Negative.   Respiratory: Negative.   Cardiovascular: Negative.   Psychiatric/Behavioral: Negative.     Per HPI unless specifically indicated above     Objective:    BP 129/81 (BP Location: Left Arm, Patient Position: Sitting, Cuff Size: Normal)   Pulse 84   Temp 98.9 F (37.2 C)   Wt 209 lb 5 oz (94.9 kg)   SpO2 100%   BMI 33.28 kg/m   Wt Readings from Last 3 Encounters:  05/09/17 209 lb 5 oz (94.9 kg)  04/07/17 207 lb (93.9 kg)  04/07/17 207 lb (93.9 kg)    Physical Exam  Constitutional: She is oriented to person, place, and time. She appears well-developed and well-nourished. No distress.  HENT:  Head: Normocephalic and atraumatic.  Right Ear: Hearing normal.  Left Ear: Hearing normal.  Nose: Nose normal.  Eyes: Conjunctivae and lids are normal. Right eye exhibits no discharge. Left eye exhibits no discharge. No scleral icterus.  Cardiovascular: Normal rate, regular rhythm, normal heart sounds and intact distal pulses.  Exam reveals no gallop and no friction rub.   No murmur heard. Pulmonary/Chest: Effort  normal and breath sounds normal. No respiratory distress. She has no wheezes. She has no rales. She exhibits no tenderness.  Musculoskeletal: Normal range of motion.  Neurological: She is alert and oriented to person, place, and time.  Skin: Skin is warm, dry and intact. No rash noted. She is not diaphoretic. No erythema. No pallor.  Psychiatric: She has a normal mood and affect. Her speech is normal and behavior is normal. Judgment and thought content normal. Cognition and memory are normal.  Nursing note and vitals reviewed.   Results for orders placed or performed during the hospital encounter of 04/07/17  Lipase, blood  Result Value Ref Range   Lipase 27 11 - 51 U/L  Comprehensive metabolic panel  Result Value Ref Range   Sodium 138 135 - 145 mmol/L   Potassium 3.7 3.5 - 5.1 mmol/L   Chloride 105 101 - 111 mmol/L   CO2 24 22 - 32 mmol/L   Glucose, Bld 109 (H) 65 - 99 mg/dL   BUN 13 6 - 20 mg/dL   Creatinine, Ser 4.09 0.44 - 1.00 mg/dL   Calcium 9.3 8.9 - 81.1 mg/dL   Total Protein 7.6 6.5 - 8.1 g/dL   Albumin 4.3 3.5 - 5.0 g/dL   AST 18 15 - 41 U/L   ALT 16 14 - 54 U/L   Alkaline Phosphatase 70 38 - 126 U/L   Total Bilirubin 0.6 0.3 - 1.2  mg/dL   GFR calc non Af Amer >60 >60 mL/min   GFR calc Af Amer >60 >60 mL/min   Anion gap 9 5 - 15  CBC  Result Value Ref Range   WBC 6.6 3.6 - 11.0 K/uL   RBC 4.37 3.80 - 5.20 MIL/uL   Hemoglobin 14.2 12.0 - 16.0 g/dL   HCT 16.140.2 09.635.0 - 04.547.0 %   MCV 92.0 80.0 - 100.0 fL   MCH 32.5 26.0 - 34.0 pg   MCHC 35.3 32.0 - 36.0 g/dL   RDW 40.912.9 81.111.5 - 91.414.5 %   Platelets 262 150 - 440 K/uL  Urinalysis, Complete w Microscopic  Result Value Ref Range   Color, Urine STRAW (A) YELLOW   APPearance CLEAR (A) CLEAR   Specific Gravity, Urine >1.046 (H) 1.005 - 1.030   pH 5.0 5.0 - 8.0   Glucose, UA NEGATIVE NEGATIVE mg/dL   Hgb urine dipstick MODERATE (A) NEGATIVE   Bilirubin Urine NEGATIVE NEGATIVE   Ketones, ur NEGATIVE NEGATIVE mg/dL    Protein, ur NEGATIVE NEGATIVE mg/dL   Nitrite NEGATIVE NEGATIVE   Leukocytes, UA TRACE (A) NEGATIVE   RBC / HPF 6-30 0 - 5 RBC/hpf   WBC, UA 0-5 0 - 5 WBC/hpf   Bacteria, UA NONE SEEN NONE SEEN   Squamous Epithelial / LPF 6-30 (A) NONE SEEN  hCG, quantitative, pregnancy  Result Value Ref Range   hCG, Beta Chain, Quant, S 1 <5 mIU/mL      Assessment & Plan:   Problem List Items Addressed This Visit    None    Visit Diagnoses    Weight gain    -  Primary   Will check with insurance to determine what's covered. Will check back in 1 month. Call with any concerns.        Follow up plan: Return in about 4 weeks (around 06/06/2017) for Follow up weight gain.

## 2017-05-09 NOTE — Progress Notes (Signed)
Gynecology Annual Exam  PCP: Dorcas CarrowJohnson, Megan P, DO  Chief Complaint:  Chief Complaint  Patient presents with  . Gynecologic Exam    History of Present Illness: Patient is a 31 y.o. [id number] presents for annual exam. The patient has no complaints today.   LMP: Patient's last menstrual period was 04/20/2017. Average Interval: regular, 28 days Duration of flow: 3 days Heavy Menses: yes Clots: no Intermenstrual Bleeding: no Postcoital Bleeding: no Dysmenorrhea: no  The patient is sexually active. She currently uses salpingectomy for contraception. She denies dyspareunia.  The patient does perform self breast exams.  There is no notable family history of breast or ovarian cancer in her family.  The patient wears seatbelts: yes.   The patient has regular exercise: yes.    The patient denies current symptoms of depression.    Review of Systems: Review of Systems  Constitutional: Negative.   HENT: Negative.   Eyes: Negative.   Respiratory: Negative.   Cardiovascular: Negative.   Gastrointestinal: Negative.   Genitourinary: Negative.   Musculoskeletal: Negative.   Skin: Negative.   Neurological: Negative.   Endo/Heme/Allergies: Negative.   Psychiatric/Behavioral: Negative.     Past Medical History:  Past Medical History:  Diagnosis Date  . Abnormal Pap smear   . Anxiety   . Complication of anesthesia    N/V, passing out with epidural  . Gestational diabetes   . Headache(784.0)   . Ovarian cyst   . Preterm labor   . Seizures (HCC)   . Urinary tract infection     Past Surgical History:  Past Surgical History:  Procedure Laterality Date  . repair of fr left foot    . THERAPEUTIC ABORTION    . TUBAL LIGATION  04/21/2011   Procedure: POST PARTUM TUBAL LIGATION;  Surgeon: Tereso NewcomerUgonna A [name], MD;  Location: [facility];  Service: Gynecology;  Laterality: Bilateral;  Bilateral post partum tubal ligation with filshie clips.  . tubalectomy Bilateral 2014    Gynecologic  History:  Patient's last menstrual period was 04/20/2017. Contraception: salpingectomy Last Pap: Results were: no abnormalities   Obstetric History: [id number]: G4P0222  Family History:  Family History  Problem Relation Age of Onset  . Hyperthyroidism Father   . Stroke Father   . Hypertension Father   . Hypothyroidism Father   . Hypothyroidism Mother   . Cancer Mother        pancreatic  . Cancer Paternal Grandfather        leukemia  . Multiple sclerosis Paternal Aunt   . Diabetes Maternal Grandmother   . COPD Maternal Grandfather   . Stroke Paternal Grandmother   . Alzheimer's disease Paternal Grandmother   . Heart disease Neg Hx     Social History:  Social History   Social History  . Marital status: Divorced    Spouse name: N/A  . Number of children: N/A  . Years of education: N/A   Occupational History  . Not on file.   Social History Main Topics  . Smoking status: Never Smoker  . Smokeless tobacco: Never Used  . Alcohol use Yes     Comment: occasion  . Drug use: No  . Sexual activity: Yes    Partners: Male    Birth control/ protection: Surgical     Comment: tubaligation.   Other Topics Concern  . Not on file   Social History Narrative  . No narrative on file    Allergies:  No Known Allergies  Medications: Prior to Admission  medications   Medication Sig Start Date End Date Taking? Authorizing Provider  Naltrexone-Bupropion HCl ER (CONTRAVE) 8-90 MG TB12 1 tab QAM x1 week, then 1tab BID x 1week, then 2qAM, 1qPM x 1 week, then 2 BID after that 05/09/17  Yes Dorcas Carrow, DO    Physical Exam Vitals: Blood pressure 120/60, pulse 66, height  (1.727 m), weight 209 lb (94.8 kg), last menstrual period 04/20/2017.  General: NAD HEENT: normocephalic, anicteric Thyroid: no enlargement, no palpable nodules Pulmonary: No increased work of breathing, CTAB Cardiovascular: RRR, distal pulses 2+ Breast: Breast symmetrical, no tenderness, no palpable nodules or  masses, no skin or nipple retraction present, no nipple discharge.  No axillary or supraclavicular lymphadenopathy. Abdomen: NABS, soft, non-tender, non-distended.  Umbilicus without lesions.  No hepatomegaly, splenomegaly or masses palpable. No evidence of hernia  Genitourinary:  External: Normal external female genitalia.  Normal urethral meatus, normal  Bartholin's and Skene's glands.    Vagina: Normal vaginal mucosa, no evidence of prolapse.    Cervix: Grossly normal in appearance, no bleeding, no CMT  Uterus: Non-enlarged, mobile, normal contour.    Adnexa: ovaries non-enlarged, no adnexal masses  Rectal: deferred  Lymphatic: no evidence of inguinal lymphadenopathy Extremities: no edema, erythema, or tenderness Neurologic: Grossly intact Psychiatric: mood appropriate, affect full   Assessment: 31 y.o. [id number] routine annual exam  Plan: Problem List Items Addressed This Visit    None      1) STI screening was offered and declined  2) ASCCP guidelines and rational discussed.  Patient opts for yearly screening interval  3) Contraception - Salpingectomy  4) Routine healthcare maintenance including cholesterol, diabetes screening discussed managed by PCP  5) Follow up 1 year for routine annual exam  Tresea Mall, CNM

## 2017-05-09 NOTE — Telephone Encounter (Signed)
Rx sent to her pharmacy 

## 2017-05-09 NOTE — Telephone Encounter (Signed)
Patient wanted to inform provider that the prescriptions that were covered was the contrave medication but medication has to be filled at [facility] on [address]   Please Advise.  Thank you

## 2017-05-09 NOTE — Telephone Encounter (Signed)
Routing to provider  

## 2017-05-09 NOTE — Patient Instructions (Addendum)
Bupropion; Naltrexone extended-release tablets What is this medicine? BUPROPION; NALTREXONE (byoo PROE pee on; nal TREX one) is a combination product used to promote and maintain weight loss in obese adults or overweight adults who also have weight related medical problems. This medicine should be used with a reduced calorie diet and increased physical activity. This medicine may be used for other purposes; ask your health care provider or pharmacist if you have questions. COMMON BRAND NAME(S): CONTRAVE What should I tell my health care provider before I take this medicine? They need to know if you have any of these conditions: -an eating disorder, such as anorexia or bulimia -bipolar disorder -diabetes -depression -drug abuse or addiction -glaucoma -head injury -heart disease -high blood pressure -history of a tumor or infection of your brain or spine -history of stroke -history of irregular heartbeat -if you often drink alcohol -kidney disease -liver disease -schizophrenia -seizures -suicidal thoughts, plans, or attempt; a previous suicide attempt by you or a family member -an unusual or allergic reaction to bupropion, naltrexone, other medicines, foods, dyes, or preservatives -breast-feeding -pregnant or trying to become pregnant How should I use this medicine? Take this medicine by mouth with a glass of water. Follow the directions on the prescription label. Take this medicine in the morning and in the evenings as directed by your healthcare professional. You can take it with or without food. Do not take with high-fat meals as this may increase your risk of seizures. Do not crush, chew, or cut these tablets. Do not take your medicine more often than directed. Do not stop taking this medicine suddenly except upon the advice of your doctor. A special MedGuide will be given to you by the pharmacist with each prescription and refill. Be sure to read this information carefully each  time. Talk to your pediatrician regarding the use of this medicine in children. Special care may be needed. Overdosage: If you think you have taken too much of this medicine contact a poison control center or emergency room at once. NOTE: This medicine is only for you. Do not share this medicine with others. What if I miss a dose? If you miss a dose, skip the missed dose and take your next tablet at the regular time. Do not take double or extra doses. What may interact with this medicine? Do not take this medicine with any of the following medications: -any prescription or street opioid drug like codeine, heroin, methadone -linezolid -MAOIs like Carbex, Eldepryl, Marplan, Nardil, and Parnate -methylene blue (injected into a vein) -other medicines that contain bupropion like Zyban or Wellbutrin This medicine may also interact with the following medications: -alcohol -certain medicines for anxiety or sleep -certain medicines for blood pressure like metoprolol, propranolol -certain medicines for depression or psychotic disturbances -certain medicines for HIV or AIDS like efavirenz, lopinavir, nelfinavir, ritonavir -certain medicines for irregular heart beat like propafenone, flecainide -certain medicines for Parkinson's disease like amantadine, levodopa -certain medicines for seizures like carbamazepine, phenytoin, phenobarbital -cimetidine -clopidogrel -cyclophosphamide -digoxin -disulfiram -furazolidone -isoniazid -nicotine -orphenadrine -procarbazine -steroid medicines like prednisone or cortisone -stimulant medicines for attention disorders, weight loss, or to stay awake -tamoxifen -theophylline -thioridazine -thiotepa -ticlopidine -tramadol -warfarin This list may not describe all possible interactions. Give your health care provider a list of all the medicines, herbs, non-prescription drugs, or dietary supplements you use. Also tell them if you smoke, drink alcohol, or use  illegal drugs. Some items may interact with your medicine. What should I watch for while using this   medicine? This medicine is intended to be used in addition to a healthy diet and appropriate exercise. The best results are achieved this way. Do not increase or in any way change your dose without consulting your doctor or health care professional. Do not take this medicine with other prescription or over-the-counter weight loss products without consulting your doctor or health care professional. Your doctor should tell you to stop taking this medicine if you do not lose a certain amount of weight within the first 12 weeks of treatment. Visit your doctor or health care professional for regular checkups. Your doctor may order blood tests or other tests to see how you are doing. This medicine may affect blood sugar levels. If you have diabetes, check with your doctor or health care professional before you change your diet or the dose of your diabetic medicine. Patients and their families should watch out for new or worsening depression or thoughts of suicide. Also watch out for sudden changes in feelings such as feeling anxious, agitated, panicky, irritable, hostile, aggressive, impulsive, severely restless, overly excited and hyperactive, or not being able to sleep. If this happens, especially at the beginning of treatment or after a change in dose, call your health care professional. Avoid alcoholic drinks while taking this medicine. Drinking large amounts of alcoholic beverages, using sleeping or anxiety medicines, or quickly stopping the use of these agents while taking this medicine may increase your risk for a seizure. What side effects may I notice from receiving this medicine? Side effects that you should report to your doctor or health care professional as soon as possible: -allergic reactions like skin rash, itching or hives, swelling of the face, lips, or tongue -breathing problems -changes in  vision -confusion -elevated mood, decreased need for sleep, racing thoughts, impulsive behavior -fast or irregular heartbeat -hallucinations, loss of contact with reality -increased blood pressure -redness, blistering, peeling or loosening of the skin, including inside the mouth -seizures -signs and symptoms of liver injury like dark yellow or brown urine; general ill feeling or flu-like symptoms; light-colored stools; loss of appetite; nausea; right upper belly pain; unusually weak or tired; yellowing of the eyes or skin -suicidal thoughts or other mood changes -vomiting Side effects that usually do not require medical attention (report to your doctor or health care professional if they continue or are bothersome): -constipation -headache -loss of appetite -indigestion, stomach upset -tremors This list may not describe all possible side effects. Call your doctor for medical advice about side effects. You may report side effects to FDA at 1-800-FDA-1088. Where should I keep my medicine? Keep out of the reach of children. Store at room temperature between 15 and 30 degrees C (59 and 86 degrees F). Throw away any unused medicine after the expiration date. NOTE: This sheet is a summary. It may not cover all possible information. If you have questions about this medicine, talk to your doctor, pharmacist, or health care provider.  2018 Elsevier/Gold Standard (2016-02-09 13:42:58) Liraglutide injection (Weight Management) What is this medicine? LIRAGLUTIDE (LIR a GLOO tide) is used with a reduced calorie diet and exercise to help you lose weight. This medicine may be used for other purposes; ask your health care provider or pharmacist if you have questions. COMMON BRAND NAME(S): Saxenda What should I tell my health care provider before I take this medicine? They need to know if you have any of these conditions: -endocrine tumors (MEN 2) or if someone in your family had these  tumors -gallbladder disease -  high cholesterol -history of alcohol abuse problem -history of pancreatitis -kidney disease or if you are on dialysis -liver disease -previous swelling of the tongue, face, or lips with difficulty breathing, difficulty swallowing, hoarseness, or tightening of the throat -stomach problems -suicidal thoughts, plans, or attempt; a previous suicide attempt by you or a family member -thyroid cancer or if someone in your family had thyroid cancer -an unusual or allergic reaction to liraglutide, other medicines, foods, dyes, or preservatives -pregnant or trying to get pregnant -breast-feeding How should I use this medicine? This medicine is for injection under the skin of your upper leg, stomach area, or upper arm. You will be taught how to prepare and give this medicine. Use exactly as directed. Take your medicine at regular intervals. Do not take it more often than directed. It is important that you put your used needles and syringes in a special sharps container. Do not put them in a trash can. If you do not have a sharps container, call your pharmacist or healthcare provider to get one. A special MedGuide will be given to you by the pharmacist with each prescription and refill. Be sure to read this information carefully each time. Talk to your pediatrician regarding the use of this medicine in children. Special care may be needed. Overdosage: If you think you have taken too much of this medicine contact a poison control center or emergency room at once. NOTE: This medicine is only for you. Do not share this medicine with others. What if I miss a dose? If you miss a dose, take it as soon as you can. If it is almost time for your next dose, take only that dose. Do not take double or extra doses. If you miss your dose for 3 days or more, call your doctor or health care professional to talk about how to restart this medicine. What may interact with this  medicine? -insulin and other medicines for diabetes This list may not describe all possible interactions. Give your health care provider a list of all the medicines, herbs, non-prescription drugs, or dietary supplements you use. Also tell them if you smoke, drink alcohol, or use illegal drugs. Some items may interact with your medicine. What should I watch for while using this medicine? Visit your doctor or health care professional for regular checks on your progress. This medicine is intended to be used in addition to a healthy diet and appropriate exercise. The best results are achieved this way. Do not increase or in any way change your dose without consulting your doctor or health care professional. Drink plenty of fluids while taking this medicine. Check with your doctor or health care professional if you get an attack of severe diarrhea, nausea, and vomiting. The loss of too much body fluid can make it dangerous for you to take this medicine. This medicine may affect blood sugar levels. If you have diabetes, check with your doctor or health care professional before you change your diet or the dose of your diabetic medicine. Patients and their families should watch out for worsening depression or thoughts of suicide. Also watch out for sudden changes in feelings such as feeling anxious, agitated, panicky, irritable, hostile, aggressive, impulsive, severely restless, overly excited and hyperactive, or not being able to sleep. If this happens, especially at the beginning of treatment or after a change in dose, call your health care professional. What side effects may I notice from receiving this medicine? Side effects that you should report to your  doctor or health care professional as soon as possible: -allergic reactions like skin rash, itching or hives, swelling of the face, lips, or tongue -breathing problems -diarrhea that continues or is severe -lump or swelling on the neck -severe  nausea -signs and symptoms of infection like fever or chills; cough; sore throat; pain or trouble passing urine -signs and symptoms of low blood sugar such as feeling anxious, confusion, dizziness, increased hunger, unusually weak or tired, sweating, shakiness, cold, irritable, headache, blurred vision, fast heartbeat, loss of consciousness -signs and symptoms of kidney injury like trouble passing urine or change in the amount of urine -trouble swallowing -unusual stomach upset or pain -vomiting Side effects that usually do not require medical attention (report to your doctor or health care professional if they continue or are bothersome): -constipation -decreased appetite -diarrhea -fatigue -headache -nausea -pain, redness, or irritation at site where injected -stomach upset -stuffy or runny nose This list may not describe all possible side effects. Call your doctor for medical advice about side effects. You may report side effects to FDA at 1-800-FDA-1088. Where should I keep my medicine? Keep out of the reach of children. Store unopened pen in a refrigerator between 2 and 8 degrees C (36 and 46 degrees F). Do not freeze or use if the medicine has been frozen. Protect from light and excessive heat. After you first use the pen, it can be stored at room temperature between 15 and 30 degrees C (59 and 86 degrees F) or in a refrigerator. Throw away your used pen after 30 days or after the expiration date, whichever comes first. Do not store your pen with the needle attached. If the needle is left on, medicine may leak from the pen. NOTE: This sheet is a summary. It may not cover all possible information. If you have questions about this medicine, talk to your doctor, pharmacist, or health care provider.  2018 Elsevier/Gold Standard (2016-09-05 14:41:37) Lorcaserin extended-release tablets What is this medicine? LORCASERIN (lor ca SER in) is used to promote and maintain weight loss in obese  patients. This medicine should be used with a reduced calorie diet and, if appropriate, an exercise program. This medicine may be used for other purposes; ask your health care provider or pharmacist if you have questions. COMMON BRAND NAME(S): Belviq XR What should I tell my health care provider before I take this medicine? They need to know if you have any of these conditions: -anatomical deformation of the penis, Peyronie's disease, or history of priapism (painful and prolonged erection) -diabetes -heart disease -history of blood diseases, like sickle cell anemia or leukemia -history of irregular heartbeat -kidney disease -liver disease -suicidal thoughts, plans, or attempt; a previous suicide attempt by you or a family member -an unusual or allergic reaction to lorcaserin, other medicines, foods, dyes, or preservatives -pregnant or trying to get pregnant -breast-feeding How should I use this medicine? Take this medicine by mouth with a glass of water. Follow the directions on the prescription label. Do not cut, crush or chew this medicine. You can take it with or without food. Take your medicine at regular intervals. Do not take it more often than directed. Do not stop taking except on your doctor's advice. Talk to your pediatrician regarding the use of this medicine in children. Special care may be needed. Overdosage: If you think you have taken too much of this medicine contact a poison control center or emergency room at once. NOTE: This medicine is only for you. Do  not share this medicine with others. What if I miss a dose? If you miss a dose, take it as soon as you can. If it is almost time for your next dose, take only that dose. Do not take double or extra doses. What may interact with this medicine? -cabergoline -certain medicines for depression, anxiety, or psychotic disturbances -certain medicines for erectile dysfunction -certain medicines for migraine headache like  almotriptan, eletriptan, frovatriptan, naratriptan, rizatriptan, sumatriptan, zolmitriptan -dextromethorphan -linezolid -lithium -medicines for diabetes -other weight loss products -tramadol -St. John's Wort -stimulant medicines for attention disorders, weight loss, or to stay awake -tryptophan This list may not describe all possible interactions. Give your health care provider a list of all the medicines, herbs, non-prescription drugs, or dietary supplements you use. Also tell them if you smoke, drink alcohol, or use illegal drugs. Some items may interact with your medicine. What should I watch for while using this medicine? This medicine is intended to be used in addition to a healthy diet and appropriate exercise. The best results are achieved this way. Your doctor should instruct you to stop using this medicine if you do not lose a certain amount of weight within the first 12 weeks of treatment, but it is important that you do not change your dose in any way without consulting your doctor or health care professional. Visit your doctor or health care professional for regular checkups. Your doctor may order blood tests or other tests to see how you are doing. Do not drive, use machinery, or do anything that needs mental alertness until you know how this medicine affects you. This medicine may affect blood sugar levels. If you have diabetes, check with your doctor or health care professional before you change your diet or the dose of your diabetic medicine. Patients and their families should watch out for worsening depression or thoughts of suicide. Also watch out for sudden changes in feelings such as feeling anxious, agitated, panicky, irritable, hostile, aggressive, impulsive, severely restless, overly excited and hyperactive, or not being able to sleep. If this happens, especially at the beginning of treatment or after a change in dose, call your health care professional. Contact your doctor or  health care professional right away if you are a man with an erection that lasts longer than 4 hours or if the erection becomes painful. This may be a sign of serious problem and must be treated right away to prevent permanent damage. What side effects may I notice from receiving this medicine? Side effects that you should report to your doctor or health care professional as soon as possible: -allergic reactions like skin rash, itching or hives, swelling of the face, lips, or tongue -abnormal production of milk -breast enlargement in both males and females -breathing problems -changes in emotions or moods -changes in vision -confusion -erection lasting more than 4 hours or a painful erection -fast or irregular heart beat -feeling faint or lightheaded, falls -fever or chills, sore throat -hallucination, loss of contact with reality -high or low blood pressure -menstrual changes -restlessness -signs and symptoms of low blood sugar such as feeling anxious; confusion; dizziness; increased hunger; unusually weak or tired; sweating; shakiness; cold; irritable; headache; blurred vision; fast heartbeat; loss of consciousness -slow or irregular heartbeat -stiff muscles -sweating -suicidal thoughts or actions -swelling of the ankles, feet, hands -unusually weak or tired -vomiting Side effects that usually do not require medical attention (report to your doctor or health care professional if they continue or are bothersome): -back pain -  constipation -cough -dry mouth -nausea -tiredness This list may not describe all possible side effects. Call your doctor for medical advice about side effects. You may report side effects to FDA at 1-800-FDA-1088. Where should I keep my medicine? Keep out of the reach of children. This medicine can be abused. Keep your medicine in a safe place to protect it from theft. Do not share this medicine with anyone. Selling or giving away this medicine is dangerous  and against the law. Store at room temperature between 15 and 30 degrees C (59 and 86 degrees F). Throw away any unused medicine after the expiration date. NOTE: This sheet is a summary. It may not cover all possible information. If you have questions about this medicine, talk to your doctor, pharmacist, or health care provider.  2018 Elsevier/Gold Standard (2015-09-20 16:24:54)

## 2017-05-13 LAB — IGP, APTIMA HPV
HPV APTIMA: NEGATIVE
PAP SMEAR COMMENT: 0

## 2017-05-26 ENCOUNTER — Telehealth: Payer: Self-pay

## 2017-05-26 NOTE — Telephone Encounter (Signed)
Pt calling for pap results.  Pt aware they are negative.

## 2017-06-04 ENCOUNTER — Telehealth: Payer: Self-pay | Admitting: Family Medicine

## 2017-06-04 NOTE — Telephone Encounter (Signed)
Patient called stating she wanted to see if there was some other med to help with weight loss per patient the Contrave helped her until she had to do the increase and then she experienced nausea, etc  [phone]  Thanks

## 2017-06-05 NOTE — Telephone Encounter (Signed)
Scheduled patient for Friday--10/5 @ 3:30 with Fleet Contras

## 2017-06-05 NOTE — Telephone Encounter (Signed)
Please get patient scheduled, she will need to come in to discuss other options.

## 2017-06-06 ENCOUNTER — Ambulatory Visit (INDEPENDENT_AMBULATORY_CARE_PROVIDER_SITE_OTHER): Payer: Managed Care, Other (non HMO) | Admitting: Family Medicine

## 2017-06-06 ENCOUNTER — Encounter: Payer: Self-pay | Admitting: Family Medicine

## 2017-06-06 VITALS — BP 121/77 | HR 100 | Temp 99.1°F | Wt 206.0 lb

## 2017-06-06 DIAGNOSIS — R635 Abnormal weight gain: Secondary | ICD-10-CM | POA: Diagnosis not present

## 2017-06-06 MED ORDER — LORCASERIN HCL 10 MG PO TABS: 10.0000 mg | ORAL_TABLET | Freq: Two times a day (BID) | ORAL | 0 refills | Status: DC

## 2017-06-06 NOTE — Progress Notes (Signed)
   BP 121/77 (BP Location: Right Arm, Patient Position: Sitting, Cuff Size: Normal)   Pulse 100   Temp 99.1 F (37.3 C)   Wt 206 lb (93.4 kg)   SpO2 99%   BMI 31.32 kg/m    Subjective:    Patient ID: [name], female    DOB: October 08, 1985, 31 y.o.   MRN: [id number]  HPI: [name] is a 30 y.o. female  Chief Complaint  Patient presents with  . Medication Management    Patient has been taking contrave but experienced side effects from medication. States it made her feel "crazy". Here to see if there's another weight loss medication.    Patient presents for weight loss management. Was started on contrave a few weeks ago and did not tolerate well. Caused nausea and agitation, stopped it completely a few days ago and doing better. Wanting to try something else. Still working on lifestyle modifications as well.   Relevant past medical, surgical, family and social history reviewed and updated as indicated. Interim medical history since our last visit reviewed. Allergies and medications reviewed and updated.  Review of Systems  Constitutional: Negative.   HENT: Negative.   Eyes: Negative.   Respiratory: Negative.   Cardiovascular: Negative.   Gastrointestinal: Negative.   Musculoskeletal: Negative.   Neurological: Negative.   Psychiatric/Behavioral: Negative.    Per HPI unless specifically indicated above     Objective:    BP 121/77 (BP Location: Right Arm, Patient Position: Sitting, Cuff Size: Normal)   Pulse 100   Temp 99.1 F (37.3 C)   Wt 206 lb (93.4 kg)   SpO2 99%   BMI 31.32 kg/m   Wt Readings from Last 3 Encounters:  06/06/17 206 lb (93.4 kg)  05/09/17 209 lb (94.8 kg)  05/09/17 209 lb 5 oz (94.9 kg)    Physical Exam  Constitutional: She is oriented to person, place, and time. She appears well-developed and well-nourished. No distress.  Cardiovascular: Normal rate and normal heart sounds.   Pulmonary/Chest: Effort normal and breath sounds normal.  No respiratory distress.  Musculoskeletal: Normal range of motion.  Neurological: She is alert and oriented to person, place, and time.  Skin: Skin is warm and dry.  Psychiatric: She has a normal mood and affect. Her behavior is normal.  Nursing note and vitals reviewed.     Assessment & Plan:   Problem List Items Addressed This Visit    None    Visit Diagnoses    Weight gain    -  Primary   Did not tolerate contrave. Will switch to belviq, continue lifestyle modifications. F/u in 1 month for recheck. Call with side effects or concerns       Follow up plan: Return in about 4 weeks (around 07/04/2017) for Weight check.

## 2017-06-07 NOTE — Patient Instructions (Signed)
Follow up in 1 month   

## 2017-07-01 ENCOUNTER — Telehealth: Payer: Self-pay | Admitting: Family Medicine

## 2017-07-01 NOTE — Telephone Encounter (Signed)
Copied from CRM [phone]. Topic: Quick Communication - See Telephone Encounter >> Jul 01, 2017  9:45 AM [name], Maryann B wrote: CRM for notification. See Telephone encounter for:  07/01/17.  Pt would like refill on beviq no refills left. She doesn't know if she needs to come back in and be seen before Dr. Laural BenesJohnson will refill

## 2017-07-01 NOTE — Telephone Encounter (Signed)
Called and left a message for patient to call and schedule a follow up for medication refill.

## 2017-07-04 ENCOUNTER — Encounter: Payer: Self-pay | Admitting: Family Medicine

## 2017-07-04 ENCOUNTER — Ambulatory Visit (INDEPENDENT_AMBULATORY_CARE_PROVIDER_SITE_OTHER): Payer: Managed Care, Other (non HMO) | Admitting: Family Medicine

## 2017-07-04 VITALS — BP 114/77 | HR 78 | Temp 98.7°F | Wt 208.4 lb

## 2017-07-04 DIAGNOSIS — E669 Obesity, unspecified: Secondary | ICD-10-CM | POA: Insufficient documentation

## 2017-07-04 DIAGNOSIS — Z6831 Body mass index (BMI) 31.0-31.9, adult: Secondary | ICD-10-CM | POA: Diagnosis not present

## 2017-07-04 DIAGNOSIS — E6609 Other obesity due to excess calories: Secondary | ICD-10-CM | POA: Diagnosis not present

## 2017-07-04 MED ORDER — LORCASERIN HCL 10 MG PO TABS: 10.0000 mg | ORAL_TABLET | Freq: Two times a day (BID) | ORAL | 2 refills | Status: DC

## 2017-07-04 NOTE — Assessment & Plan Note (Addendum)
Tolerating belviq well. Continue current regimen. Continue to monitor. Call with any concerns. Recheck on weight loss in 2 months with goal of 5% weight loss

## 2017-07-04 NOTE — Progress Notes (Signed)
BP 114/77 (BP Location: Right Arm, Patient Position: Sitting, Cuff Size: Normal)   Pulse 78   Temp 98.7 F (37.1 C)   Wt 208 lb 6 oz (94.5 kg)   SpO2 100%   BMI 31.68 kg/m    Subjective:    Patient ID: [name], female    DOB: 05/16/1986, 31 y.o.   MRN: [id number]  HPI: [name] is a 31 y.o. female  Chief Complaint  Patient presents with  . Obesity   WEIGHT GAIN Duration: about 10 months Previous attempts at weight loss: yes- diet, exercising 2 hours a day, now with belviq  Complications of obesity: None Peak weight: 224 Weight loss goal: 180 Weight loss to date: 16lbs Requesting obesity pharmacotherapy: yes Current weight loss supplements/medications: yes Previous weight loss supplements/meds: no   Relevant past medical, surgical, family and social history reviewed and updated as indicated. Interim medical history since our last visit reviewed. Allergies and medications reviewed and updated.  Review of Systems  Constitutional: Negative.   Respiratory: Negative.   Cardiovascular: Negative.   Gastrointestinal: Negative.     Per HPI unless specifically indicated above     Objective:    BP 114/77 (BP Location: Right Arm, Patient Position: Sitting, Cuff Size: Normal)   Pulse 78   Temp 98.7 F (37.1 C)   Wt 208 lb 6 oz (94.5 kg)   SpO2 100%   BMI 31.68 kg/m   Wt Readings from Last 3 Encounters:  07/04/17 208 lb 6 oz (94.5 kg)  06/06/17 206 lb (93.4 kg)  05/09/17 209 lb (94.8 kg)    Physical Exam  Constitutional: She is oriented to person, place, and time. She appears well-developed and well-nourished. No distress.  HENT:  Head: Normocephalic and atraumatic.  Right Ear: Hearing normal.  Left Ear: Hearing normal.  Nose: Nose normal.  Eyes: Conjunctivae and lids are normal. Right eye exhibits no discharge. Left eye exhibits no discharge. No scleral icterus.  Cardiovascular: Normal rate, regular rhythm, normal heart sounds and intact  distal pulses.  Exam reveals no gallop and no friction rub.   No murmur heard. Pulmonary/Chest: Effort normal and breath sounds normal. No respiratory distress. She has no wheezes. She has no rales. She exhibits no tenderness.  Musculoskeletal: Normal range of motion.  Neurological: She is alert and oriented to person, place, and time.  Skin: Skin is warm, dry and intact. No rash noted. She is not diaphoretic. No erythema. No pallor.  Psychiatric: She has a normal mood and affect. Her speech is normal and behavior is normal. Judgment and thought content normal. Cognition and memory are normal.    Results for orders placed or performed in visit on 05/09/17  IGP, Aptima HPV  Result Value Ref Range   DIAGNOSIS: Comment    Specimen adequacy: Comment    Clinician Provided ICD10 Comment    Performed by: Comment    PAP Smear Comment .    Note: Comment    Test Methodology Comment    HPV Aptima Negative Negative      Assessment & Plan:   Problem List Items Addressed This Visit      Other   Obesity - Primary    Tolerating belviq well. Continue current regimen. Continue to monitor. Call with any concerns. Recheck on weight loss in 2 months with goal of 5% weight loss      Relevant Medications   Lorcaserin HCl 10 MG TABS       Follow up  plan: Return in about 2 months (around 09/03/2017) for follow up weight loss.

## 2017-09-25 NOTE — Progress Notes (Deleted)
   There were no vitals taken for this visit.   Subjective:    Patient ID: Arminda ResidesSylvia A Buchanan, female    DOB: 1985-12-28, 32 y.o.   MRN: [id number]  HPI: Arminda ResidesSylvia A Buchanan is a 32 y.o. female  No chief complaint on file.  WEIGHT GAIN Duration:  Previous attempts at weight loss: {Blank single:19197::"yes","no"} Complications of obesity:  Peak weight:  Weight loss goal:  Weight loss to date:  Requesting obesity pharmacotherapy: {Blank single:19197::"yes","no"} Current weight loss supplements/medications: {Blank single:19197::"yes","no"} Previous weight loss supplements/meds: {Blank single:19197::"yes","no"}  Relevant past medical, surgical, family and social history reviewed and updated as indicated. Interim medical history since our last visit reviewed. Allergies and medications reviewed and updated.  Review of Systems  Per HPI unless specifically indicated above     Objective:    There were no vitals taken for this visit.  Wt Readings from Last 3 Encounters:  07/04/17 208 lb 6 oz (94.5 kg)  06/06/17 206 lb (93.4 kg)  05/09/17 209 lb (94.8 kg)    Physical Exam  Results for orders placed or performed in visit on 05/09/17  IGP, Aptima HPV  Result Value Ref Range   DIAGNOSIS: Comment    Specimen adequacy: Comment    Clinician Provided ICD10 Comment    Performed by: Comment    PAP Smear Comment .    Note: Comment    Test Methodology Comment    HPV Aptima Negative Negative      Assessment & Plan:   Problem List Items Addressed This Visit    None       Follow up plan: No Follow-up on file.

## 2017-09-26 ENCOUNTER — Ambulatory Visit: Payer: Managed Care, Other (non HMO) | Admitting: Family Medicine

## 2018-01-13 ENCOUNTER — Ambulatory Visit (INDEPENDENT_AMBULATORY_CARE_PROVIDER_SITE_OTHER): Payer: Managed Care, Other (non HMO) | Admitting: Family Medicine

## 2018-01-13 ENCOUNTER — Encounter: Payer: Self-pay | Admitting: Family Medicine

## 2018-01-13 ENCOUNTER — Ambulatory Visit: Payer: Self-pay | Admitting: *Deleted

## 2018-01-13 VITALS — BP 92/64 | HR 94 | Temp 98.7°F | Wt 219.1 lb

## 2018-01-13 DIAGNOSIS — E6609 Other obesity due to excess calories: Secondary | ICD-10-CM | POA: Diagnosis not present

## 2018-01-13 DIAGNOSIS — L509 Urticaria, unspecified: Secondary | ICD-10-CM | POA: Diagnosis not present

## 2018-01-13 DIAGNOSIS — W5501XA Bitten by cat, initial encounter: Secondary | ICD-10-CM

## 2018-01-13 DIAGNOSIS — Z6831 Body mass index (BMI) 31.0-31.9, adult: Secondary | ICD-10-CM

## 2018-01-13 LAB — CBC WITH DIFFERENTIAL/PLATELET
Hematocrit: 37.7 % (ref 34.0–46.6)
Hemoglobin: 13.6 g/dL (ref 11.1–15.9)
Lymphocytes Absolute: 1.7 10*3/uL (ref 0.7–3.1)
Lymphs: 28 %
MCH: 32.2 pg (ref 26.6–33.0)
MCHC: 36.1 g/dL — ABNORMAL HIGH (ref 31.5–35.7)
MCV: 89 fL (ref 79–97)
MID (ABSOLUTE): 0.5 10*3/uL (ref 0.1–1.6)
MID: 8 %
Neutrophils Absolute: 3.8 10*3/uL (ref 1.4–7.0)
Neutrophils: 64 %
PLATELETS: 223 10*3/uL (ref 150–379)
RBC: 4.22 x10E6/uL (ref 3.77–5.28)
RDW: 12.9 % (ref 12.3–15.4)
WBC: 6 10*3/uL (ref 3.4–10.8)

## 2018-01-13 MED ORDER — DOXYCYCLINE HYCLATE 100 MG PO TABS: 100.0000 mg | ORAL_TABLET | Freq: Two times a day (BID) | ORAL | 0 refills | Status: DC

## 2018-01-13 MED ORDER — TRIAMCINOLONE ACETONIDE 40 MG/ML IJ SUSP
40.0000 mg | Freq: Once | INTRAMUSCULAR | Status: AC
  Administered 2018-01-13: 40 mg via INTRAMUSCULAR

## 2018-01-13 MED ORDER — PREDNISONE 10 MG PO TABS: ORAL_TABLET | ORAL | 0 refills | Status: DC

## 2018-01-13 MED ORDER — AMOXICILLIN-POT CLAVULANATE 875-125 MG PO TABS
1.0000 | ORAL_TABLET | Freq: Two times a day (BID) | ORAL | 0 refills | Status: DC
Start: 2018-01-13 — End: 2018-01-13

## 2018-01-13 MED ORDER — LIRAGLUTIDE -WEIGHT MANAGEMENT 18 MG/3ML ~~LOC~~ SOPN
PEN_INJECTOR | SUBCUTANEOUS | 12 refills | Status: DC
Start: 2018-01-13 — End: 2018-03-02

## 2018-01-13 MED ORDER — AMOXICILLIN-POT CLAVULANATE 875-125 MG PO TABS: 1.0000 | ORAL_TABLET | Freq: Two times a day (BID) | ORAL | 0 refills | Status: DC

## 2018-01-13 NOTE — Telephone Encounter (Signed)
Just FYI--see message

## 2018-01-13 NOTE — Assessment & Plan Note (Signed)
Gaining weight on belviq. Could not tolerate contrave. Will start saxenda. Recheck 1 month.

## 2018-01-13 NOTE — Progress Notes (Signed)
BP 92/64 (BP Location: Left Wrist, Patient Position: Sitting, Cuff Size: Normal)   Pulse 94   Temp 98.7 F (37.1 C) (Oral)   Wt 219 lb 1.6 oz (99.4 kg)   SpO2 99%   BMI 33.31 kg/m     Subjective:    Patient ID: [name], female    DOB: 02-01-1986, 32 y.o.   MRN: [id number]  HPI: [name] is a 32 y.o. female  Chief Complaint  Patient presents with  . Cat Bite    Bit by cat on right hand Sunday. Rash developed on arm. Assumed it was an allergic reaction, took benadryl and used lotion. Rash has now spread to face, chest and left arm.  . Rash    Denies any itching. Patient states it feels like pin needles.  . Weight Gain    Patient is gaining weight while on Belviq.   Got bit on her hand by her cat 3 days ago. Has been red and swollen. Has developed rash since then.   RASH Duration:  3 days  Location: generalized  Itching: no Burning: no Redness: yes Oozing: no Scaling: no Blisters: no Painful: yes Fevers: no Change in detergents/soaps/personal care products: no Recent illness: no Recent travel:no History of same: no Context: worse Alleviating factors: benadryl Treatments attempted:benadryl Shortness of breath: no  Throat/tongue swelling: no Myalgias/arthralgias: no  WEIGHT GAIN Duration: chronic Previous attempts at weight loss: yes Complications of obesity: None Peak weight: 224 Weight loss goal: 180 Weight loss to date: 5 lbs  Requesting obesity pharmacotherapy: yes Current weight loss supplements/medications: yes Previous weight loss supplements/meds: yes  Relevant past medical, surgical, family and social history reviewed and updated as indicated. Interim medical history since our last visit reviewed. Allergies and medications reviewed and updated.  Review of Systems  Constitutional: Positive for unexpected weight change. Negative for activity change, appetite change, chills, diaphoresis, fatigue and fever.  Respiratory: Negative.    Cardiovascular: Negative.   Musculoskeletal: Negative.   Skin: Positive for rash. Negative for color change, pallor and wound.  Psychiatric/Behavioral: Negative.     Per HPI unless specifically indicated above     Objective:    BP 92/64 (BP Location: Left Wrist, Patient Position: Sitting, Cuff Size: Normal)   Pulse 94   Temp 98.7 F (37.1 C) (Oral)   Wt 219 lb 1.6 oz (99.4 kg)   SpO2 99%   BMI 33.31 kg/m    Wt Readings from Last 3 Encounters:  01/13/18 219 lb 1.6 oz (99.4 kg)  07/04/17 208 lb 6 oz (94.5 kg)  06/06/17 206 lb (93.4 kg)    Physical Exam  Constitutional: She is oriented to person, place, and time. She appears well-developed and well-nourished. No distress.  HENT:  Head: Normocephalic and atraumatic.  Right Ear: Hearing normal.  Left Ear: Hearing normal.  Nose: Nose normal.  Eyes: Conjunctivae and lids are normal. Right eye exhibits no discharge. Left eye exhibits no discharge. No scleral icterus.  Cardiovascular: Normal rate, regular rhythm, normal heart sounds and intact distal pulses. Exam reveals no gallop and no friction rub.  No murmur heard. Pulmonary/Chest: Effort normal and breath sounds normal. No stridor. No respiratory distress. She has no wheezes. She has no rales. She exhibits no tenderness.  Musculoskeletal: Normal range of motion.  Neurological: She is alert and oriented to person, place, and time.  Skin: Skin is warm, dry and intact. Capillary refill takes less than 2 seconds. Rash noted. She is not diaphoretic. There  is erythema. No pallor.  Erythematous swollen area on R hand Patches of erythematous papules on arms and chest and belly  Psychiatric: She has a normal mood and affect. Her speech is normal and behavior is normal. Judgment and thought content normal. Cognition and memory are normal.  Nursing note and vitals reviewed.   Results for orders placed or performed in visit on 05/09/17  IGP, Aptima HPV  Result Value Ref Range    DIAGNOSIS: Comment    Specimen adequacy: Comment    Clinician Provided ICD10 Comment    Performed by: Comment    PAP Smear Comment .    Note: Comment    Test Methodology Comment    HPV Aptima Negative Negative      Assessment & Plan:   Problem List Items Addressed This Visit      Other   Obesity    Gaining weight on belviq. Could not tolerate contrave. Will start saxenda. Recheck 1 month.       Relevant Medications   Liraglutide -Weight Management (SAXENDA) 18 MG/3ML SOPN    Other Visit Diagnoses    Cat bite, initial encounter    -  Primary   Will treat with augmentin and doxycyline. Call if not getting better or getting worse.    Relevant Orders   CBC With Differential/Platelet   Hives       Kenalog shot today, then start 12 day prednisone taper tomorrow. Continue benadryl. Call with any concerns.    Relevant Medications   triamcinolone acetonide (KENALOG-40) injection 40 mg (Completed)       Follow up plan: Return in about 1 month (around 02/10/2018) for follow up weight.

## 2018-01-13 NOTE — Telephone Encounter (Signed)
I returned her call.   Her father's cat scratched the surface of the skin on her right hand on Sunday while she was playing with him.  It was 2 very small puncture marks.   You can't hardly see it now.    He is up to date on his vaccines.   About 5-6 hours after that she developed a rash like spot in the bend of her right arm.  She thought it was an allergic reaction though she is not allergic to cats that she knows of so she took a Benadryl.     Today she noticed the rash has spread to her stomach, breasts and back.  It does not itch and she is not having wheezing, shortness or breath or pain.   She is still taking the Benadryl but it doesn't seem to be helping. She thought maybe it was poison ivy because she has been working in the yard.  She put some Calamine lotion on the rash it made it worse.  See triage notes.  I made her an appt with Olevia Perches for today at 11:15.    Reason for Disposition . [1] Puncture wound (hole through the skin) from claws or teeth AND [2] cat  Answer Assessment - Initial Assessment Questions 1. ANIMAL: "What type of animal caused the bite?" "Is the injury from a bite or a claw?" If the animal is a dog or a cat, ask: "Was it a pet or a stray?" "Was it acting ill or behaving strangely?"     Cat bite 2. LOCATION: "Where is the bite located?"      On my right hand.   Within 5-6 hours  I got a spot in the bend of my arm.   I thought it was an allergic reaction.   I took some Benadryl.    Now today I have a rash on my stomach, breasts and back.   Rash Big red blotchy spots like blisters.    They don't itch.   I'm not allergic to cats that I know of.   Calamine lotion was used and made it worse. 3. SIZE: "How big is the bite?" "What does it look like?"      The bite was 2 little punctures that barely scratched the skin. 4. ONSET: "When did the bite happen?" (Minutes or hours ago)      Sunday 5. CIRCUMSTANCES: "Tell me how this happened."      I was playing with the  cat.     6. TETANUS: "When was the last tetanus booster?"     It's my Dad's cat and he is up to date on his vaccines. 7. PREGNANCY: "Is there any chance you are pregnant?" "When was your last menstrual period?"     No.    Surgically fixed.  Protocols used: ANIMAL BITE-A-AH

## 2018-01-20 ENCOUNTER — Telehealth: Payer: Self-pay | Admitting: Family Medicine

## 2018-01-20 MED ORDER — PEN NEEDLES 32G X 5 MM MISC: 1.0000 | Freq: Every day | 4 refills | Status: DC

## 2018-01-20 NOTE — Telephone Encounter (Signed)
Rx sent to her pharmacy 

## 2018-01-20 NOTE — Telephone Encounter (Signed)
Copied from CRM [id number]. Topic: Quick Communication - See Telephone Encounter >> Jan 20, 2018  2:35 PM Lorrine Kin, [place] wrote: CRM for notification. See Telephone encounter for: 01/20/18. Fleet Contras, [facility], calling and states that a prescription for BD Pen needles are needed for the patient's Saxenda injections. Please advise.  [id number] - [place], [place] - [address]

## 2018-02-11 ENCOUNTER — Ambulatory Visit (INDEPENDENT_AMBULATORY_CARE_PROVIDER_SITE_OTHER): Payer: Managed Care, Other (non HMO) | Admitting: Obstetrics and Gynecology

## 2018-02-11 ENCOUNTER — Encounter: Payer: Self-pay | Admitting: Obstetrics and Gynecology

## 2018-02-11 ENCOUNTER — Ambulatory Visit (INDEPENDENT_AMBULATORY_CARE_PROVIDER_SITE_OTHER): Payer: Managed Care, Other (non HMO)

## 2018-02-11 VITALS — BP 102/70 | Ht 69.0 in | Wt 209.0 lb

## 2018-02-11 DIAGNOSIS — N939 Abnormal uterine and vaginal bleeding, unspecified: Secondary | ICD-10-CM | POA: Diagnosis not present

## 2018-02-11 DIAGNOSIS — N941 Unspecified dyspareunia: Secondary | ICD-10-CM

## 2018-02-11 DIAGNOSIS — R102 Pelvic and perineal pain: Secondary | ICD-10-CM

## 2018-02-11 DIAGNOSIS — N739 Female pelvic inflammatory disease, unspecified: Secondary | ICD-10-CM

## 2018-02-11 MED ORDER — METRONIDAZOLE 500 MG PO TABS: 500.0000 mg | ORAL_TABLET | Freq: Two times a day (BID) | ORAL | 0 refills | Status: AC

## 2018-02-11 MED ORDER — DOXYCYCLINE HYCLATE 100 MG PO CAPS: 100.0000 mg | ORAL_CAPSULE | Freq: Two times a day (BID) | ORAL | 0 refills | Status: AC

## 2018-02-11 MED ORDER — CEFTRIAXONE SODIUM 250 MG IJ SOLR
250.0000 mg | Freq: Once | INTRAMUSCULAR | Status: AC
  Administered 2018-02-13: 250 mg via INTRAMUSCULAR

## 2018-02-11 NOTE — Progress Notes (Signed)
Patient ID: [name], female   DOB: May 02, 1986, 32 y.o.   MRN: [id number]  Reason for Consult: abnormal period (pt reports long period this month with brown spotting and right side pain)   Referred by Dorcas Carrow, DO  Subjective:     HPI:  [name] is a 32 y.o. female .Pt reports long period this month with brown spotting and right side pain.  Past Medical History:  Diagnosis Date  . Abnormal Pap smear   . Anxiety   . Complication of anesthesia    N/V, passing out with epidural  . Gestational diabetes   . Headache(784.0)   . Ovarian cyst   . Preterm labor   . Seizures (HCC)   . Urinary tract infection    Family History  Problem Relation Age of Onset  . Hyperthyroidism Father   . Stroke Father   . Hypertension Father   . Hypothyroidism Father   . Hypothyroidism Mother   . Cancer Mother        pancreatic  . Cancer Paternal Grandfather        leukemia  . Multiple sclerosis Paternal Aunt   . Diabetes Maternal Grandmother   . COPD Maternal Grandfather   . Stroke Paternal Grandmother   . Alzheimer's disease Paternal Grandmother   . Heart disease Neg Hx    Past Surgical History:  Procedure Laterality Date  . repair of fr left foot    . THERAPEUTIC ABORTION    . TUBAL LIGATION  04/21/2011   Procedure: POST PARTUM TUBAL LIGATION;  Surgeon: Tereso Newcomer, MD;  Location: [facility];  Service: Gynecology;  Laterality: Bilateral;  Bilateral post partum tubal ligation with filshie clips.  . tubalectomy Bilateral 2014    Short Social History:  Social History   Tobacco Use  . Smoking status: Never Smoker  . Smokeless tobacco: Never Used  Substance Use Topics  . Alcohol use: Yes    Comment: occasion    Allergies  Allergen Reactions  . Contrave [Naltrexone-Bupropion Hcl Er] Nausea Only    irritability    Current Outpatient Medications  Medication Sig Dispense Refill  . Liraglutide -Weight Management (SAXENDA) 18 MG/3ML SOPN Inject 0.6 mg  into the skin daily for 7 days, THEN 1.2 mg daily for 7 days, THEN 1.8 mg daily for 7 days, THEN 2.4 mg daily for 7 days, THEN 3 mg daily. 3 mL 12  . amoxicillin-clavulanate (AUGMENTIN) 875-125 MG tablet Take 1 tablet by mouth 2 (two) times daily. (Patient not taking: Reported on 02/11/2018) 20 tablet 0  . doxycycline (VIBRA-TABS) 100 MG tablet Take 1 tablet (100 mg total) by mouth 2 (two) times daily. (Patient not taking: Reported on 02/11/2018) 14 tablet 0  . Insulin Pen Needle (PEN NEEDLES) 32G X 5 MM MISC 1 each by Does not apply route daily. (Patient not taking: Reported on 02/11/2018) 100 each 4  . predniSONE (DELTASONE) 10 MG tablet 6 tabs today and tomorrow, then 5 tabs for the next 2 days, decrease by 1 every other day until gone. (Patient not taking: Reported on 02/11/2018) 42 tablet 0   No current facility-administered medications for this visit.     Review of Systems  Constitutional: Negative for chills, fatigue, fever and unexpected weight change.  HENT: Negative for trouble swallowing.  Eyes: Negative for loss of vision.  Respiratory: Negative for cough, shortness of breath and wheezing.  Cardiovascular: Negative for chest pain, leg swelling, palpitations and syncope.  GI: Negative for  abdominal pain, blood in stool, diarrhea, nausea and vomiting.  GU: Negative for difficulty urinating, dysuria, frequency and hematuria.  Musculoskeletal: Negative for back pain, leg pain and joint pain.  Skin: Negative for rash.  Neurological: Negative for dizziness, headaches, light-headedness, numbness and seizures.  Psychiatric: Negative for behavioral problem, confusion, depressed mood and sleep disturbance.        Objective:  Objective   Vitals:   02/11/18 1420  BP: 102/70  Weight: 209 lb (94.8 kg)  Height: 5\' 9"  (1.753 m)   Body mass index is 30.86 kg/m.  Physical Exam  Constitutional: She is oriented to person, place, and time. She appears well-developed and well-nourished.   HENT:  Head: Normocephalic and atraumatic.  Eyes: EOM are normal.  Cardiovascular: Normal rate, regular rhythm and normal heart sounds.  Pulmonary/Chest: Effort normal and breath sounds normal.  Neurological: She is alert and oriented to person, place, and time.  Skin: Skin is warm and dry.  Psychiatric: She has a normal mood and affect. Her behavior is normal. Judgment and thought content normal.  Nursing note and vitals reviewed.      Assessment/Plan:    32 yo with pelvic pain Pain in right side worse with intercourse Will treat patient for PID, no other causes of pelvic pain apparent at this time. Return for a pelvic US.  She is interested in having an ablation.    Adelene Idlerhristanna Baylee Campus MD [facility], [facility] 02/11/18 2:57 PM

## 2018-02-13 ENCOUNTER — Ambulatory Visit (INDEPENDENT_AMBULATORY_CARE_PROVIDER_SITE_OTHER): Payer: Managed Care, Other (non HMO)

## 2018-02-13 ENCOUNTER — Ambulatory Visit: Payer: Managed Care, Other (non HMO) | Admitting: Family Medicine

## 2018-02-13 DIAGNOSIS — N739 Female pelvic inflammatory disease, unspecified: Secondary | ICD-10-CM

## 2018-03-02 ENCOUNTER — Encounter: Payer: Self-pay | Admitting: Family Medicine

## 2018-03-02 ENCOUNTER — Ambulatory Visit (INDEPENDENT_AMBULATORY_CARE_PROVIDER_SITE_OTHER): Payer: Managed Care, Other (non HMO) | Admitting: Family Medicine

## 2018-03-02 DIAGNOSIS — Z6830 Body mass index (BMI) 30.0-30.9, adult: Secondary | ICD-10-CM | POA: Diagnosis not present

## 2018-03-02 DIAGNOSIS — E6609 Other obesity due to excess calories: Secondary | ICD-10-CM | POA: Diagnosis not present

## 2018-03-02 MED ORDER — PEN NEEDLES 32G X 5 MM MISC: 1.0000 | Freq: Every day | 4 refills | Status: DC

## 2018-03-02 MED ORDER — LIRAGLUTIDE -WEIGHT MANAGEMENT 18 MG/3ML ~~LOC~~ SOPN: 3.0000 mg | PEN_INJECTOR | Freq: Every day | SUBCUTANEOUS | 12 refills | Status: AC

## 2018-03-02 NOTE — Progress Notes (Signed)
BP 131/84 (BP Location: Left Arm, Patient Position: Sitting, Cuff Size: Large)   Pulse 88   Temp 98.5 F (36.9 C)   Wt 203 lb 4 oz (92.2 kg)   SpO2 99%   BMI 30.01 kg/m    Subjective:    Patient ID: [name], female    DOB: 21-Mar-1986, 32 y.o.   MRN: [id number]  HPI: [name] is a 32 y.o. female  Chief Complaint  Patient presents with  . Weight Check   WEIGHT GAIN Duration: chronic Previous attempts at weight loss: yes Complications of obesity: None Peak weight: 224 Weight loss goal: 180  Weight loss to date: 16 lbs Requesting obesity pharmacotherapy: yes Current weight loss supplements/medications: yes Previous weight loss supplements/meds: yes  Relevant past medical, surgical, family and social history reviewed and updated as indicated. Interim medical history since our last visit reviewed. Allergies and medications reviewed and updated.  Review of Systems  Constitutional: Negative.   Respiratory: Negative.   Cardiovascular: Negative.   Psychiatric/Behavioral: Negative.     Per HPI unless specifically indicated above     Objective:    BP 131/84 (BP Location: Left Arm, Patient Position: Sitting, Cuff Size: Large)   Pulse 88   Temp 98.5 F (36.9 C)   Wt 203 lb 4 oz (92.2 kg)   SpO2 99%   BMI 30.01 kg/m   Wt Readings from Last 3 Encounters:  03/02/18 203 lb 4 oz (92.2 kg)  02/11/18 209 lb (94.8 kg)  01/13/18 219 lb 1.6 oz (99.4 kg)    Physical Exam  Constitutional: She is oriented to person, place, and time. She appears well-developed and well-nourished. No distress.  HENT:  Head: Normocephalic and atraumatic.  Right Ear: Hearing normal.  Left Ear: Hearing normal.  Nose: Nose normal.  Eyes: Conjunctivae and lids are normal. Right eye exhibits no discharge. Left eye exhibits no discharge. No scleral icterus.  Cardiovascular: Normal rate, regular rhythm, normal heart sounds and intact distal pulses. Exam reveals no gallop and no  friction rub.  No murmur heard. Pulmonary/Chest: Effort normal and breath sounds normal. No stridor. No respiratory distress. She has no wheezes. She has no rales. She exhibits no tenderness.  Musculoskeletal: Normal range of motion.  Neurological: She is alert and oriented to person, place, and time.  Skin: Skin is warm, dry and intact. Capillary refill takes less than 2 seconds. No rash noted. She is not diaphoretic. No erythema. No pallor.  Psychiatric: She has a normal mood and affect. Her speech is normal and behavior is normal. Judgment and thought content normal. Cognition and memory are normal.  Nursing note and vitals reviewed.   Results for orders placed or performed in visit on 01/13/18  CBC With Differential/Platelet  Result Value Ref Range   WBC 6.0 3.4 - 10.8 x10E3/uL   RBC 4.22 3.77 - 5.28 x10E6/uL   Hemoglobin 13.6 11.1 - 15.9 g/dL   Hematocrit 11.937.7 14.734.0 - 46.6 %   MCV 89 79 - 97 fL   MCH 32.2 26.6 - 33.0 pg   MCHC 36.1 (H) 31.5 - 35.7 g/dL   RDW 82.912.9 56.212.3 - 13.015.4 %   Platelets 223 150 - 379 x10E3/uL   Neutrophils 64 Not Estab. %   Lymphs 28 Not Estab. %   MID 8 Not Estab. %   Neutrophils Absolute 3.8 1.4 - 7.0 x10E3/uL   Lymphocytes Absolute 1.7 0.7 - 3.1 x10E3/uL   MID (Absolute) 0.5 0.1 - 1.6 X10E3/uL  Assessment & Plan:   Problem List Items Addressed This Visit      Other   Obesity    Doing great on saxenda! Down 16lbs so far! Continue diet and exercise. Continue saxenda. Recheck 3 months. Call with any concerns.       Relevant Medications   Liraglutide -Weight Management (SAXENDA) 18 MG/3ML SOPN       Follow up plan: Return in about 3 months (around 06/02/2018) for Follow up weight.

## 2018-03-02 NOTE — Assessment & Plan Note (Signed)
Doing great on saxenda! Down 16lbs so far! Continue diet and exercise. Continue saxenda. Recheck 3 months. Call with any concerns.

## 2018-03-11 ENCOUNTER — Emergency Department
Admission: EM | Admit: 2018-03-11 | Discharge: 2018-03-11 | Disposition: A | Discharge status: Home / Self Care | Payer: Managed Care, Other (non HMO) | Attending: Emergency Medicine | Admitting: Emergency Medicine

## 2018-03-11 ENCOUNTER — Emergency Department: Payer: Managed Care, Other (non HMO)

## 2018-03-11 ENCOUNTER — Encounter: Payer: Self-pay | Admitting: Emergency Medicine

## 2018-03-11 ENCOUNTER — Other Ambulatory Visit: Payer: Self-pay

## 2018-03-11 DIAGNOSIS — M545 Low back pain: Secondary | ICD-10-CM | POA: Diagnosis present

## 2018-03-11 DIAGNOSIS — X500XXA Overexertion from strenuous movement or load, initial encounter: Secondary | ICD-10-CM | POA: Insufficient documentation

## 2018-03-11 DIAGNOSIS — Y929 Unspecified place or not applicable: Secondary | ICD-10-CM | POA: Diagnosis not present

## 2018-03-11 DIAGNOSIS — M47896 Other spondylosis, lumbar region: Secondary | ICD-10-CM | POA: Diagnosis not present

## 2018-03-11 DIAGNOSIS — Y999 Unspecified external cause status: Secondary | ICD-10-CM | POA: Insufficient documentation

## 2018-03-11 DIAGNOSIS — Z79899 Other long term (current) drug therapy: Secondary | ICD-10-CM | POA: Diagnosis not present

## 2018-03-11 DIAGNOSIS — Y9389 Activity, other specified: Secondary | ICD-10-CM | POA: Insufficient documentation

## 2018-03-11 MED ORDER — METHOCARBAMOL 750 MG PO TABS: 750.0000 mg | ORAL_TABLET | Freq: Four times a day (QID) | ORAL | 0 refills | Status: DC

## 2018-03-11 MED ORDER — DEXAMETHASONE SODIUM PHOSPHATE 10 MG/ML IJ SOLN
10.0000 mg | Freq: Once | INTRAMUSCULAR | Status: AC
  Administered 2018-03-11: 10 mg via INTRAMUSCULAR
  Filled 2018-03-11: qty 1

## 2018-03-11 MED ORDER — CYCLOBENZAPRINE HCL 10 MG PO TABS
10.0000 mg | ORAL_TABLET | Freq: Once | ORAL | Status: AC
  Administered 2018-03-11: 10 mg via ORAL
  Filled 2018-03-11: qty 1

## 2018-03-11 MED ORDER — METHYLPREDNISOLONE 4 MG PO TBPK: ORAL_TABLET | ORAL | 0 refills | Status: DC

## 2018-03-11 MED ORDER — HYDROMORPHONE HCL 1 MG/ML IJ SOLN
1.0000 mg | Freq: Once | INTRAMUSCULAR | Status: AC
  Administered 2018-03-11: 1 mg via INTRAMUSCULAR
  Filled 2018-03-11: qty 1

## 2018-03-11 MED ORDER — KETOROLAC TROMETHAMINE 30 MG/ML IJ SOLN
30.0000 mg | Freq: Once | INTRAMUSCULAR | Status: AC
  Administered 2018-03-11: 30 mg via INTRAMUSCULAR
  Filled 2018-03-11: qty 1

## 2018-03-11 MED ORDER — OXYCODONE-ACETAMINOPHEN 7.5-325 MG PO TABS: 1.0000 | ORAL_TABLET | Freq: Four times a day (QID) | ORAL | 0 refills | Status: DC | PRN

## 2018-03-11 NOTE — ED Provider Notes (Signed)
[facility] Emergency Department Provider Note   ____________________________________________   First MD Initiated Contact with Patient 03/11/18 1922     (approximate)  I have reviewed the triage vital signs and the nursing notes.   HISTORY  Chief Complaint Back Pain    HPI [name] is a 32 y.o. female patient complain of low back pain for 2 days.  Patient states she went to a chiropractor and after unsuccessful manipulation states she thinks she has a slipped disc.  Patient was given a TENS unit which does not help her back pain.  Patient denies radicular component to her back pain.  Patient denies bladder bowel dysfunction.  Patient states she fell in ocean a few times last weekend.  And also moved some heavy suitcases 3 days ago.  Patient rates the pain as a 10/10.  Patient described the pain as sharp/achy.  Patient states pain increases with extension of the back decreases with flexion of the back.  Past Medical History:  Diagnosis Date  . Abnormal Pap smear   . Anxiety   . Complication of anesthesia    N/V, passing out with epidural  . Gestational diabetes   . Headache(784.0)   . Ovarian cyst   . Preterm labor   . Seizures (HCC)   . Urinary tract infection     Patient Active Problem List   Diagnosis Date Noted  . Obesity 07/04/2017  . Acute anxiety 05/10/2016  . Insomnia 05/10/2016  . Heavy menstrual bleeding 03/29/2015  . Low grade squamous intraepithelial lesion (LGSIL) on Papanicolaou smear of cervix 09/29/2013    Past Surgical History:  Procedure Laterality Date  . repair of fr left foot    . THERAPEUTIC ABORTION    . TUBAL LIGATION  04/21/2011   Procedure: POST PARTUM TUBAL LIGATION;  Surgeon: Tereso Newcomer, MD;  Location: [facility];  Service: Gynecology;  Laterality: Bilateral;  Bilateral post partum tubal ligation with filshie clips.  . tubalectomy Bilateral 2014    Prior to Admission medications   Medication Sig  Start Date End Date Taking? Authorizing Provider  Insulin Pen Needle (PEN NEEDLES) 32G X 5 MM MISC 1 each by Does not apply route daily. 03/02/18   Johnson, Megan P, DO  Liraglutide -Weight Management (SAXENDA) 18 MG/3ML SOPN Inject 3 mg into the skin daily. 03/02/18 06/10/18  Olevia Perches P, DO  methocarbamol (ROBAXIN-750) 750 MG tablet Take 1 tablet (750 mg total) by mouth 4 (four) times daily. 03/11/18   Joni Reining, PA-C  methylPREDNISolone (MEDROL DOSEPAK) 4 MG TBPK tablet Take Tapered dose as directed 03/11/18   Joni Reining, PA-C  oxyCODONE-acetaminophen (PERCOCET) 7.5-325 MG tablet Take 1 tablet by mouth every 6 (six) hours as needed for severe pain. 03/11/18   Joni Reining, PA-C    Allergies Contrave [naltrexone-bupropion hcl er]  Family History  Problem Relation Age of Onset  . Hyperthyroidism Father   . Stroke Father   . Hypertension Father   . Hypothyroidism Father   . Hypothyroidism Mother   . Cancer Mother        pancreatic  . Cancer Paternal Grandfather        leukemia  . Multiple sclerosis Paternal Aunt   . Diabetes Maternal Grandmother   . COPD Maternal Grandfather   . Stroke Paternal Grandmother   . Alzheimer's disease Paternal Grandmother   . Heart disease Neg Hx     Social History Social History   Tobacco Use  .  Smoking status: Never Smoker  . Smokeless tobacco: Never Used  Substance Use Topics  . Alcohol use: Yes    Comment: occasion  . Drug use: No    Review of Systems  Constitutional: No fever/chills Eyes: No visual changes. ENT: No sore throat. Cardiovascular: Denies chest pain. Respiratory: Denies shortness of breath. Gastrointestinal: No abdominal pain.  No nausea, no vomiting.  No diarrhea.  No constipation. Genitourinary: Negative for dysuria. Musculoskeletal: Positive for back pain. Skin: Negative for rash. Neurological: Negative for headaches, focal weakness or numbness. Psychiatric: Anxiety and  insomnia.Endocrine: ____________________________________________   PHYSICAL EXAM:  VITAL SIGNS: ED Triage Vitals  Enc Vitals Group     BP 03/11/18 1840 123/76     Pulse Rate 03/11/18 1840 92     Resp 03/11/18 1840 17     Temp 03/11/18 1840 98.1 F (36.7 C)     Temp Source 03/11/18 1840 Oral     SpO2 03/11/18 1840 99 %     Weight 03/11/18 1841 199 lb (90.3 kg)     Height 03/11/18 1841 5\' 8"  (1.727 m)     Head Circumference --      Peak Flow --      Pain Score 03/11/18 1841 10     Pain Loc --      Pain Edu? --      Excl. in GC? --    Constitutional: Alert and oriented.  Moderate distress.   Cardiovascular: Normal rate, regular rhythm. Grossly normal heart sounds.  Good peripheral circulation. Respiratory: Normal respiratory effort.  No retractions. Lungs CTAB. Musculoskeletal: No obvious deformity to the lumbar spine.  Patient states in a flexed position.  Patient has moderate guarding palpation at T12 and L2-4.  Patient refused to lie down for straight leg test. Neurologic:  Normal speech and language. No gross focal neurologic deficits are appreciated. No gait instability. Skin:  Skin is warm, dry and intact. No rash noted. Psychiatric: Mood and affect are normal. Speech and behavior are normal.  ____________________________________________   LABS (all labs ordered are listed, but only abnormal results are displayed)  Labs Reviewed - No data to display ____________________________________________  EKG   ____________________________________________  RADIOLOGY  ED MD interpretation:    Official radiology report(s): Dg Lumbar Spine 2-3 Views  Result Date: 03/11/2018 CLINICAL DATA:  Lower back pain for 3 days. EXAM: LUMBAR SPINE - 2-3 VIEW COMPARISON:  04/07/2017 abdominal CT FINDINGS: Mild dextrocurvature of the lumbar spine is positional based on prior. There is reversal of lumbar lordosis. T11-12 mild endplate spurring also seen by CT in 2018. Minimal L2-3  endplate spurring. L5-S1 disc is narrower than the other levels, likely developmental given lack of spurring. IMPRESSION: 1. No acute osseous finding. 2. Minimal endplate spurring at T11-12 and L2-3. Electronically Signed   By: Marnee SpringJonathon  Watts M.D.   On: 03/11/2018 21:21    ____________________________________________   PROCEDURES  Procedure(s) performed:   Procedures  Critical Care performed: No  ____________________________________________   INITIAL IMPRESSION / ASSESSMENT AND PLAN / ED COURSE  As part of my medical decision making, I reviewed the following data within the electronic MEDICAL RECORD NUMBER    Low back pain with onset osteoarthritis findings.  Discussed x-ray findings with patient.  Patient given discharge care instructions.  Patient given a work note and advised take medication directed.  Patient advised to follow orthopedic for definitive evaluation and treatment.      ____________________________________________   FINAL CLINICAL IMPRESSION(S) / ED DIAGNOSES  Final  diagnoses:  Other osteoarthritis of spine, lumbar region     ED Discharge Orders        Ordered    oxyCODONE-acetaminophen (PERCOCET) 7.5-325 MG tablet  Every 6 hours PRN     03/11/18 2134    methocarbamol (ROBAXIN-750) 750 MG tablet  4 times daily     03/11/18 2134    methylPREDNISolone (MEDROL DOSEPAK) 4 MG TBPK tablet     03/11/18 2134       Note:  This document was prepared using Dragon voice recognition software and may include unintentional dictation errors.    Joni Reining, PA-C 03/11/18 2224    Minna Antis, MD 03/11/18 2252

## 2018-03-11 NOTE — ED Triage Notes (Signed)
Pt reports lower back pain x3 days, reports she's been going to the chiropractor for the past three days and they seem to think she has a slipped disc. Pt has tens unit on high on lower back with no relief. Pt ambulatory to room. Pt reports falling in the ocean a few times before the pain and moved heavy suitcases on Saturday.

## 2018-03-13 ENCOUNTER — Other Ambulatory Visit: Payer: Self-pay | Admitting: Orthopedic Surgery

## 2018-03-13 ENCOUNTER — Ambulatory Visit
Admission: RE | Admit: 2018-03-13 | Discharge: 2018-03-13 | Disposition: A | Discharge status: Home / Self Care | Payer: Managed Care, Other (non HMO) | Source: Ambulatory Visit | Attending: Orthopedic Surgery | Admitting: Orthopedic Surgery

## 2018-03-13 DIAGNOSIS — M5117 Intervertebral disc disorders with radiculopathy, lumbosacral region: Secondary | ICD-10-CM | POA: Insufficient documentation

## 2018-03-13 DIAGNOSIS — M5441 Lumbago with sciatica, right side: Secondary | ICD-10-CM

## 2018-03-13 DIAGNOSIS — M5442 Lumbago with sciatica, left side: Secondary | ICD-10-CM | POA: Diagnosis present

## 2018-04-20 ENCOUNTER — Telehealth: Payer: Self-pay | Admitting: Family Medicine

## 2018-04-20 NOTE — Telephone Encounter (Signed)
Pharmacy changed, order written, will get signature and fax.

## 2018-04-20 NOTE — Telephone Encounter (Signed)
Copied from CRM [phone]. Topic: Quick Communication - See Telephone Encounter >> Apr 20, 2018  9:04 AM Terisa Starraylor, Brittany L wrote: CRM for notification. See Telephone encounter for: 04/20/18.  Patient called and said she needs her Insulin Pen Needle (PEN NEEDLES) 32G X 5 MM [facility] [address] [place] but states she never picked it up because it was to far of a drive. She also wants all her other scripts moved there. Please advise.

## 2018-04-23 ENCOUNTER — Other Ambulatory Visit: Payer: Self-pay | Admitting: Neurological Surgery

## 2018-04-24 ENCOUNTER — Other Ambulatory Visit: Payer: Self-pay | Admitting: Neurological Surgery

## 2018-04-24 DIAGNOSIS — G8929 Other chronic pain: Secondary | ICD-10-CM

## 2018-04-24 DIAGNOSIS — M5442 Lumbago with sciatica, left side: Secondary | ICD-10-CM

## 2018-04-24 DIAGNOSIS — M5126 Other intervertebral disc displacement, lumbar region: Secondary | ICD-10-CM

## 2018-04-30 ENCOUNTER — Ambulatory Visit
Admission: RE | Admit: 2018-04-30 | Discharge: 2018-04-30 | Disposition: A | Discharge status: Home / Self Care | Payer: Managed Care, Other (non HMO) | Source: Ambulatory Visit | Attending: Neurological Surgery | Admitting: Neurological Surgery

## 2018-04-30 DIAGNOSIS — G8929 Other chronic pain: Secondary | ICD-10-CM

## 2018-04-30 DIAGNOSIS — M5126 Other intervertebral disc displacement, lumbar region: Secondary | ICD-10-CM

## 2018-04-30 DIAGNOSIS — M5442 Lumbago with sciatica, left side: Secondary | ICD-10-CM

## 2018-06-05 ENCOUNTER — Ambulatory Visit (INDEPENDENT_AMBULATORY_CARE_PROVIDER_SITE_OTHER): Payer: Managed Care, Other (non HMO) | Admitting: Family Medicine

## 2018-06-05 ENCOUNTER — Encounter: Payer: Self-pay | Admitting: Family Medicine

## 2018-06-05 ENCOUNTER — Other Ambulatory Visit: Payer: Self-pay

## 2018-06-05 VITALS — BP 105/72 | HR 98 | Temp 98.1°F | Ht 69.0 in | Wt 199.0 lb

## 2018-06-05 DIAGNOSIS — Z6830 Body mass index (BMI) 30.0-30.9, adult: Secondary | ICD-10-CM | POA: Diagnosis not present

## 2018-06-05 DIAGNOSIS — E66811 Obesity, class 1: Secondary | ICD-10-CM

## 2018-06-05 DIAGNOSIS — E6609 Other obesity due to excess calories: Secondary | ICD-10-CM | POA: Diagnosis not present

## 2018-06-05 NOTE — Assessment & Plan Note (Signed)
Doing great! Weight down 20lbs. Will continue current regimen and continue to monitor. Call with any concerns.

## 2018-06-05 NOTE — Progress Notes (Signed)
BP 105/72   Pulse 98   Temp 98.1 F (36.7 C) (Oral)   Ht 5\' 9"  (1.753 m)   Wt 199 lb (90.3 kg)   SpO2 98%   BMI 29.39 kg/m    Subjective:    Patient ID: [name], female    DOB: 1985-09-21, 32 y.o.   MRN: [id number]  HPI: [name] is a 33 y.o. female  Chief Complaint  Patient presents with  . Weight Check    f/u   WEIGHT GAIN Duration: chronic Previous attempts at weight loss: yes Complications of obesity:  Peak weight: 224 Weight loss goal: 180 Weight loss to date: 20lbs! Requesting obesity pharmacotherapy: yes Current weight loss supplements/medications: yes Previous weight loss supplements/meds: yes  Relevant past medical, surgical, family and social history reviewed and updated as indicated. Interim medical history since our last visit reviewed. Allergies and medications reviewed and updated.  Review of Systems  Constitutional: Negative.   Respiratory: Negative.   Cardiovascular: Negative.   Musculoskeletal: Positive for back pain. Negative for arthralgias, gait problem, joint swelling, myalgias, neck pain and neck stiffness.  Skin: Negative.   Psychiatric/Behavioral: Negative.     Per HPI unless specifically indicated above     Objective:    BP 105/72   Pulse 98   Temp 98.1 F (36.7 C) (Oral)   Ht 5\' 9"  (1.753 m)   Wt 199 lb (90.3 kg)   SpO2 98%   BMI 29.39 kg/m   Wt Readings from Last 3 Encounters:  06/05/18 199 lb (90.3 kg)  03/11/18 199 lb (90.3 kg)  03/02/18 203 lb 4 oz (92.2 kg)    Physical Exam  Constitutional: She is oriented to person, place, and time. She appears well-developed and well-nourished. No distress.  HENT:  Head: Normocephalic and atraumatic.  Right Ear: Hearing normal.  Left Ear: Hearing normal.  Nose: Nose normal.  Eyes: Conjunctivae and lids are normal. Right eye exhibits no discharge. Left eye exhibits no discharge. No scleral icterus.  Cardiovascular: Normal rate, regular rhythm, normal heart  sounds and intact distal pulses. Exam reveals no gallop and no friction rub.  No murmur heard. Pulmonary/Chest: Effort normal and breath sounds normal. No stridor. No respiratory distress. She has no wheezes. She has no rales. She exhibits no tenderness.  Musculoskeletal: Normal range of motion.  Neurological: She is alert and oriented to person, place, and time.  Skin: Skin is intact. No rash noted. She is not diaphoretic.  Psychiatric: She has a normal mood and affect. Her speech is normal and behavior is normal. Judgment and thought content normal. Cognition and memory are normal.    Results for orders placed or performed in visit on 01/13/18  CBC With Differential/Platelet  Result Value Ref Range   WBC 6.0 3.4 - 10.8 x10E3/uL   RBC 4.22 3.77 - 5.28 x10E6/uL   Hemoglobin 13.6 11.1 - 15.9 g/dL   Hematocrit 62.1 30.8 - 46.6 %   MCV 89 79 - 97 fL   MCH 32.2 26.6 - 33.0 pg   MCHC 36.1 (H) 31.5 - 35.7 g/dL   RDW 65.7 84.6 - 96.2 %   Platelets 223 150 - 379 x10E3/uL   Neutrophils 64 Not Estab. %   Lymphs 28 Not Estab. %   MID 8 Not Estab. %   Neutrophils Absolute 3.8 1.4 - 7.0 x10E3/uL   Lymphocytes Absolute 1.7 0.7 - 3.1 x10E3/uL   MID (Absolute) 0.5 0.1 - 1.6 X10E3/uL  Assessment & Plan:   Problem List Items Addressed This Visit      Other   Obesity - Primary    Doing great! Weight down 20lbs. Will continue current regimen and continue to monitor. Call with any concerns.           Follow up plan: Return in about 3 months (around 09/05/2018) for follow up weight.

## 2018-06-09 ENCOUNTER — Telehealth: Payer: Self-pay | Admitting: Family Medicine

## 2018-06-09 NOTE — Telephone Encounter (Signed)
She has 12 refills on that medicine. She should not need a new Rx.

## 2018-06-09 NOTE — Telephone Encounter (Signed)
PA form was faxed back. Tried to put PA through on Cover My Meds and it is saying that patient can not be found. Called and let patient know this. She states that she has had the same insurance and she is going to contact them. Will contact the number on the PA form as well to see if we can figure out what is going on.

## 2018-06-09 NOTE — Telephone Encounter (Signed)
Copied from CRM [id number]. Topic: Quick Communication - Rx Refill/Question >> Jun 09, 2018 10:01 AM [name] wrote: Medication:Liraglutide -Weight Management (SAXENDA) 18 MG/3ML SOPN    Has the patient contacted their pharmacy? Yes, patient stated that she can not get this medication due no pre authorization. She will be out of medication on tomorrow.   Preferred Pharmacy (with phone number or street name):[id number] [place], [place] - [address] [phone] (Phone) [phone] (Fax)    Agent: Please be advised that RX refills may take up to 3 business days. We ask that you follow-up with your pharmacy.

## 2018-06-09 NOTE — Telephone Encounter (Signed)
Called [employer]. Found out that patient's prescription coverage is through [facility]. Put PA through on Cover My Meds with [facility] and medication was approved.  Called and left patient a VM letting her know this and asked for her to please call me back if she has any questions or concerns.

## 2018-06-09 NOTE — Telephone Encounter (Signed)
Can we check on this PA- I thought we already did it.

## 2018-12-07 ENCOUNTER — Other Ambulatory Visit: Payer: Self-pay

## 2018-12-07 ENCOUNTER — Encounter: Payer: Self-pay | Admitting: Family Medicine

## 2018-12-07 ENCOUNTER — Ambulatory Visit (INDEPENDENT_AMBULATORY_CARE_PROVIDER_SITE_OTHER): Payer: Managed Care, Other (non HMO) | Admitting: Family Medicine

## 2018-12-07 VITALS — Ht 68.0 in | Wt 193.0 lb

## 2018-12-07 DIAGNOSIS — L237 Allergic contact dermatitis due to plants, except food: Secondary | ICD-10-CM | POA: Diagnosis not present

## 2018-12-07 MED ORDER — CLOBETASOL PROPIONATE 0.05 % EX OINT: 1.0000 "application " | TOPICAL_OINTMENT | Freq: Two times a day (BID) | CUTANEOUS | 0 refills | Status: DC

## 2018-12-07 NOTE — Progress Notes (Signed)
Ht 5\' 8"  (1.727 m)   Wt 193 lb (87.5 kg)   BMI 29.35 kg/m    Subjective:    Patient ID: [name], female    DOB: [birth year], 33 y.o.   MRN: [id number]  HPI: IOLANA Nichols is a 33 y.o. female  Chief Complaint  Patient presents with  . Poison Ivy    Ongoing 2 days. Is worsening. Has use Calamine Lotion and taken benadryl with no benefit, Location: Arms, Bilateral.     . This visit was completed via WebEx due to the restrictions of the COVID-19 pandemic. All issues as above were discussed and addressed. Physical exam was done as above through visual confirmation on WebEx. If it was felt that the patient should be evaluated in the office, they were directed there. The patient verbally consented to this visit. . Location of the patient: work . Location of the provider: home . Those involved with this call:  . Provider: Roosvelt Maser, PA-C . CMA: Myrtha Mantis, CMA . Front Desk/Registration: Harriet Pho  . Time spent on call: 15 minutes with patient face to face via video conference. More than 50% of this time was spent in counseling and coordination of care.  C/o 2 days of itchy rash in isolated areas on both forearms that started after clearing some brush over the weekend. Denies fever, chills, new foods or hygiene products. Taking benadryl without benefit. Denies any spreading, just no improvement.   Relevant past medical, surgical, family and social history reviewed and updated as indicated. Interim medical history since our last visit reviewed. Allergies and medications reviewed and updated.  Review of Systems  Per HPI unless specifically indicated above     Objective:    Ht 5\' 8"  (1.727 m)   Wt 193 lb (87.5 kg)   BMI 29.35 kg/m   Wt Readings from Last 3 Encounters:  12/07/18 193 lb (87.5 kg)  06/05/18 199 lb (90.3 kg)  03/11/18 199 lb (90.3 kg)    Physical Exam Vitals signs and nursing note reviewed.  Constitutional:      General: She is not in acute  distress.    Appearance: Normal appearance.  HENT:     Head: Atraumatic.     Right Ear: External ear normal.     Left Ear: External ear normal.     Nose: Nose normal. No congestion.     Mouth/Throat:     Mouth: Mucous membranes are moist.     Pharynx: Oropharynx is clear. No posterior oropharyngeal erythema.  Eyes:     Extraocular Movements: Extraocular movements intact.     Conjunctiva/sclera: Conjunctivae normal.  Neck:     Musculoskeletal: Normal range of motion.  Cardiovascular:     Comments: Unable to assess via virtual visit Pulmonary:     Effort: Pulmonary effort is normal. No respiratory distress.  Musculoskeletal: Normal range of motion.  Skin:    General: Skin is dry.     Findings: Rash (erythematous maculopapular rash in two isolated patches, one on each forearm) present.  Neurological:     Mental Status: She is alert and oriented to person, place, and time.  Psychiatric:        Mood and Affect: Mood normal.        Thought Content: Thought content normal.        Judgment: Judgment normal.     Results for orders placed or performed in visit on 01/13/18  CBC With Differential/Platelet  Result Value Ref Range  WBC 6.0 3.4 - 10.8 x10E3/uL   RBC 4.22 3.77 - 5.28 x10E6/uL   Hemoglobin 13.6 11.1 - 15.9 g/dL   Hematocrit 76.1 60.7 - 46.6 %   MCV 89 79 - 97 fL   MCH 32.2 26.6 - 33.0 pg   MCHC 36.1 (H) 31.5 - 35.7 g/dL   RDW 37.1 06.2 - 69.4 %   Platelets 223 150 - 379 x10E3/uL   Neutrophils 64 Not Estab. %   Lymphs 28 Not Estab. %   MID 8 Not Estab. %   Neutrophils Absolute 3.8 1.4 - 7.0 x10E3/uL   Lymphocytes Absolute 1.7 0.7 - 3.1 x10E3/uL   MID (Absolute) 0.5 0.1 - 1.6 X10E3/uL      Assessment & Plan:   Problem List Items Addressed This Visit    None    Visit Diagnoses    Allergic contact dermatitis due to plants, except food    -  Primary   Given isolated pattern without spread, will tx with topical clobetasol ointment and continue benadryl. Start  prednisone if starting to spread, not improving       Follow up plan: Return if symptoms worsen or fail to improve.

## 2019-01-27 ENCOUNTER — Other Ambulatory Visit: Payer: Self-pay | Admitting: Family Medicine

## 2019-02-01 ENCOUNTER — Other Ambulatory Visit: Payer: Self-pay | Admitting: Family Medicine

## 2019-02-01 NOTE — Telephone Encounter (Signed)
Is this okay to refill? 

## 2019-02-02 ENCOUNTER — Encounter: Payer: Self-pay | Admitting: Family Medicine

## 2019-02-02 NOTE — Telephone Encounter (Signed)
Requested medication (s) are due for refill today:   Yes  Requested medication (s) are on the active medication list:   Yes  Future visit scheduled:   This medication was ordered by a historical provider, not Dr. Laural Benes   Last ordered: 11/18/2018  No other information     Requested Prescriptions  Pending Prescriptions Disp Refills   SAXENDA 18 MG/3ML SOPN [Pharmacy Med Name: SAXENDA 18 MG/3 ML PEN]  7    Sig: SEE ATTACHED SHEET FOR DIRECTIONS     Endocrinology:  Diabetes - GLP-1 Receptor Agonists Failed - 02/01/2019  3:41 PM      Failed - HBA1C is between 0 and 7.9 and within 180 days    HB A1C (BAYER DCA - WAIVED)  Date Value Ref Range Status  01/24/2017 5.1 <7.0 % Final    Comment:                                          Diabetic Adult            <7.0                                       Healthy Adult        4.3 - 5.7                                                           (DCCT/NGSP) American Diabetes Association's Summary of Glycemic Recommendations for Adults with Diabetes: Hemoglobin A1c <7.0%. More stringent glycemic goals (A1c <6.0%) may further reduce complications at the cost of increased risk of hypoglycemia.          Passed - Valid encounter within last 6 months    Recent Outpatient Visits          1 month ago Allergic contact dermatitis due to plants, except food   [facility] [place], [place]   8 months ago Class 1 obesity due to excess calories without serious comorbidity with body mass index (BMI) of 30.0 to 30.9 in adult   [facility], Megan P, [place]   11 months ago Class 1 obesity due to excess calories without serious comorbidity with body mass index (BMI) of 30.0 to 30.9 in adult   [facility], [place], [place]   1 year ago Cat bite, initial encounter   [facility] [place], Megan P, DO   1 year ago Class 1 obesity due to excess calories without serious comorbidity with body mass  index (BMI) of 31.0 to 31.9 in adult   [facility], [place], DO

## 2019-02-02 NOTE — Telephone Encounter (Signed)
Letter printed to mail to pt.  °

## 2019-02-02 NOTE — Telephone Encounter (Signed)
Needs follow up appointment.  

## 2019-02-02 NOTE — Telephone Encounter (Signed)
Called and left a message letting the patient know that she needs an appointment to get this medication refilled.

## 2019-02-04 ENCOUNTER — Other Ambulatory Visit: Payer: Self-pay

## 2019-02-04 ENCOUNTER — Ambulatory Visit (INDEPENDENT_AMBULATORY_CARE_PROVIDER_SITE_OTHER): Payer: Managed Care, Other (non HMO) | Admitting: Family Medicine

## 2019-02-04 ENCOUNTER — Encounter: Payer: Self-pay | Admitting: Family Medicine

## 2019-02-04 VITALS — Wt 196.0 lb

## 2019-02-04 DIAGNOSIS — E6609 Other obesity due to excess calories: Secondary | ICD-10-CM | POA: Diagnosis not present

## 2019-02-04 DIAGNOSIS — Z6830 Body mass index (BMI) 30.0-30.9, adult: Secondary | ICD-10-CM | POA: Diagnosis not present

## 2019-02-04 MED ORDER — LIRAGLUTIDE -WEIGHT MANAGEMENT 18 MG/3ML ~~LOC~~ SOPN: 3.0000 mg | PEN_INJECTOR | Freq: Every day | SUBCUTANEOUS | 6 refills | Status: DC

## 2019-02-04 MED ORDER — PEN NEEDLES 32G X 5 MM MISC: 1.0000 | Freq: Every day | 4 refills | Status: DC

## 2019-02-04 NOTE — Progress Notes (Signed)
Wt 196 lb (88.9 kg)   BMI 29.80 kg/m    Subjective:    Patient ID: [name], female    DOB: 13-Sep-1985, 33 y.o.   MRN: [id number]  HPI: [name] is a 33 y.o. female  Chief Complaint  Patient presents with  . Follow-up  . Medication Refill   WEIGHT GAIN Duration: chronic Previous attempts at weight loss: yes Complications of obesity: none Peak weight: 224 Weight loss goal: 180  Weight loss to date: 28lbs Requesting obesity pharmacotherapy: yes Current weight loss supplements/medications: yes Previous weight loss supplements/meds: no  Relevant past medical, surgical, family and social history reviewed and updated as indicated. Interim medical history since our last visit reviewed. Allergies and medications reviewed and updated.  Review of Systems  Constitutional: Negative.   Respiratory: Negative.   Cardiovascular: Negative.   Skin: Negative.   Psychiatric/Behavioral: Negative.     Per HPI unless specifically indicated above     Objective:    Wt 196 lb (88.9 kg)   BMI 29.80 kg/m   Wt Readings from Last 3 Encounters:  02/04/19 196 lb (88.9 kg)  12/07/18 193 lb (87.5 kg)  06/05/18 199 lb (90.3 kg)    Physical Exam Vitals signs and nursing note reviewed.  Constitutional:      General: She is not in acute distress.    Appearance: Normal appearance. She is not ill-appearing, toxic-appearing or diaphoretic.  HENT:     Head: Normocephalic and atraumatic.     Right Ear: External ear normal.     Left Ear: External ear normal.     Nose: Nose normal.     Mouth/Throat:     Mouth: Mucous membranes are moist.     Pharynx: Oropharynx is clear.  Eyes:     General: No scleral icterus.       Right eye: No discharge.        Left eye: No discharge.     Extraocular Movements: Extraocular movements intact.     Conjunctiva/sclera: Conjunctivae normal.     Pupils: Pupils are equal, round, and reactive to light.  Neck:     Musculoskeletal: Normal range  of motion and neck supple.  Cardiovascular:     Rate and Rhythm: Normal rate and regular rhythm.     Pulses: Normal pulses.     Heart sounds: Normal heart sounds. No murmur. No friction rub. No gallop.   Pulmonary:     Effort: Pulmonary effort is normal. No respiratory distress.     Breath sounds: Normal breath sounds. No stridor. No wheezing, rhonchi or rales.  Chest:     Chest wall: No tenderness.  Musculoskeletal: Normal range of motion.  Skin:    General: Skin is warm and dry.     Capillary Refill: Capillary refill takes less than 2 seconds.     Coloration: Skin is not jaundiced or pale.     Findings: No bruising, erythema, lesion or rash.  Neurological:     General: No focal deficit present.     Mental Status: She is alert and oriented to person, place, and time. Mental status is at baseline.  Psychiatric:        Mood and Affect: Mood normal.        Behavior: Behavior normal.        Thought Content: Thought content normal.        Judgment: Judgment normal.     Results for orders placed or performed in visit on 01/13/18  CBC With Differential/Platelet  Result Value Ref Range   WBC 6.0 3.4 - 10.8 x10E3/uL   RBC 4.22 3.77 - 5.28 x10E6/uL   Hemoglobin 13.6 11.1 - 15.9 g/dL   Hematocrit 59.7 41.6 - 46.6 %   MCV 89 79 - 97 fL   MCH 32.2 26.6 - 33.0 pg   MCHC 36.1 (H) 31.5 - 35.7 g/dL   RDW 38.4 53.6 - 46.8 %   Platelets 223 150 - 379 x10E3/uL   Neutrophils 64 Not Estab. %   Lymphs 28 Not Estab. %   MID 8 Not Estab. %   Neutrophils Absolute 3.8 1.4 - 7.0 x10E3/uL   Lymphocytes Absolute 1.7 0.7 - 3.1 x10E3/uL   MID (Absolute) 0.5 0.1 - 1.6 X10E3/uL      Assessment & Plan:   Problem List Items Addressed This Visit      Other   Obesity - Primary    Doing great! Down 28lbs. Continue current regimen. Continue to monitor. Call with any concerns.       Relevant Medications   Liraglutide -Weight Management (SAXENDA) 18 MG/3ML SOPN       Follow up plan: Return in  about 6 months (around 08/06/2019) for Physical.    . This visit was completed via FaceTime due to the restrictions of the COVID-19 pandemic. All issues as above were discussed and addressed. Physical exam was done as above through visual confirmation on FaceTime. If it was felt that the patient should be evaluated in the office, they were directed there. The patient verbally consented to this visit. . Location of the patient: home . Location of the provider: work . Those involved with this call:  . Provider: Olevia Perches, DO . CMA: Sheilah Mins, CMA . Front Desk/Registration: Adela Ports  . Time spent on call: 15 minutes with patient face to face via video conference. More than 50% of this time was spent in counseling and coordination of care. 23 minutes total spent in review of patient's record and preparation of their chart.

## 2019-02-04 NOTE — Assessment & Plan Note (Signed)
Doing great! Down 28lbs. Continue current regimen. Continue to monitor. Call with any concerns.

## 2019-06-22 ENCOUNTER — Telehealth: Payer: Self-pay | Admitting: Family Medicine

## 2019-06-22 NOTE — Telephone Encounter (Signed)
Copied from [place] [id number]. Topic: General - Other >> Jun 22, 2019  9:58 AM Rutherford Nail, NT wrote: Reason for CRM: Patient's husband, Shanon Brow, calling and states that he is worried about his wife. States that he is not sure that she is telling dr Wynetta Emery the real story behind her diet pills. Husband would like a phone call to discuss before patient's appointment tomorrow (06/23/2019). Please advise. Does not want wife to know that he called. CB#: [phone]

## 2019-06-22 NOTE — Telephone Encounter (Signed)
Please find out what he's talking about

## 2019-06-22 NOTE — Telephone Encounter (Signed)
Noted  

## 2019-06-22 NOTE — Telephone Encounter (Signed)
Called and spoke with patients husband he is concerned about the following, he states that she is miserable, very moody, not sexually active, really fatigued and gaining weight, he states that he is concerned about her and would like to give a heads up before she comes in for an appt.

## 2019-06-23 ENCOUNTER — Other Ambulatory Visit: Payer: Self-pay

## 2019-06-23 ENCOUNTER — Ambulatory Visit (INDEPENDENT_AMBULATORY_CARE_PROVIDER_SITE_OTHER): Payer: Managed Care, Other (non HMO) | Admitting: Family Medicine

## 2019-06-23 ENCOUNTER — Encounter: Payer: Self-pay | Admitting: Family Medicine

## 2019-06-23 DIAGNOSIS — R197 Diarrhea, unspecified: Secondary | ICD-10-CM | POA: Diagnosis not present

## 2019-06-23 DIAGNOSIS — R6882 Decreased libido: Secondary | ICD-10-CM | POA: Diagnosis not present

## 2019-06-23 DIAGNOSIS — E6609 Other obesity due to excess calories: Secondary | ICD-10-CM

## 2019-06-23 DIAGNOSIS — E66811 Other obesity due to excess calories: Secondary | ICD-10-CM

## 2019-06-23 DIAGNOSIS — K921 Melena: Secondary | ICD-10-CM

## 2019-06-23 DIAGNOSIS — Z6830 Body mass index (BMI) 30.0-30.9, adult: Secondary | ICD-10-CM

## 2019-06-23 NOTE — Progress Notes (Signed)
There were no vitals taken for this visit.   Subjective:    Patient ID: [name], female    DOB: 02-12-1986, 33 y.o.   MRN: [id number]  HPI: [name] is a 33 y.o. female  Chief Complaint  Patient presents with  . Sexual Problem    pt states she has no sex drive what so ever, states she wonders if it is a hormonal issue  . Medication Problem    pt states she was loosing weight on the Saxenda but is staying about the same now    WEIGHT GAIN- has been exercising, but still not changing her diet.  Duration: chronic Previous attempts at weight loss: yes Complications of obesity: none Peak weight: 224 Weight loss goal: 180 Weight loss to date: 25lbs  Notes that she has been having no sex drive. This has been going on for about a month. She is noticing that her hair is growing really quickly. No extra stress. Things are good at home.   Depression screen [facility] 2/9 06/23/2019 06/05/2018 07/04/2017 06/07/2016  Decreased Interest 1 0 0 2  Down, Depressed, Hopeless 0 0 0 1  PHQ - 2 Score 1 0 0 3  Altered sleeping 0 3 0 3  Tired, decreased energy 1 0 0 3  Change in appetite 0 0 0 1  Feeling bad or failure about yourself  0 0 0 1  Trouble concentrating 0 0 0 0  Moving slowly or fidgety/restless 0 0 0 0  Suicidal thoughts 0 0 0 0  PHQ-9 Score 2 3 0 11  Difficult doing work/chores Not difficult at all Not difficult at all Not difficult at all -   ABDOMINAL ISSUES Duration: few weeks Nature: bloating Location: diffuse  Severity: moderate  Radiation: no Episode duration: Frequency: a few times a day Treatments attempted: pepto bismol Constipation: no Diarrhea: yes Mucous in the stool: yes Heartburn: no Bloating:yes Flatulence: yes Nausea: no Vomiting: no Melena or hematochezia: yes Rash: no Jaundice: no Fever: no Weight loss: no  Relevant past medical, surgical, family and social history reviewed and updated as indicated. Interim medical history since our  last visit reviewed. Allergies and medications reviewed and updated.  Review of Systems  Constitutional: Negative.   Respiratory: Negative.   Cardiovascular: Negative.   Gastrointestinal: Positive for abdominal pain, anal bleeding and diarrhea. Negative for abdominal distention, blood in stool, constipation, nausea, rectal pain and vomiting.  Genitourinary: Negative.   Musculoskeletal: Negative.   Psychiatric/Behavioral: Negative.     Per HPI unless specifically indicated above     Objective:    There were no vitals taken for this visit.  Wt Readings from Last 3 Encounters:  02/04/19 196 lb (88.9 kg)  12/07/18 193 lb (87.5 kg)  06/05/18 199 lb (90.3 kg)    Physical Exam Vitals signs and nursing note reviewed.  Constitutional:      General: She is not in acute distress.    Appearance: Normal appearance. She is not ill-appearing, toxic-appearing or diaphoretic.  HENT:     Head: Normocephalic and atraumatic.     Right Ear: External ear normal.     Left Ear: External ear normal.     Nose: Nose normal.     Mouth/Throat:     Mouth: Mucous membranes are moist.     Pharynx: Oropharynx is clear.  Eyes:     General: No scleral icterus.       Right eye: No discharge.  Left eye: No discharge.     Conjunctiva/sclera: Conjunctivae normal.     Pupils: Pupils are equal, round, and reactive to light.  Neck:     Musculoskeletal: Normal range of motion.  Pulmonary:     Effort: Pulmonary effort is normal. No respiratory distress.     Comments: Speaking in full sentences Musculoskeletal: Normal range of motion.  Skin:    Coloration: Skin is not jaundiced or pale.     Findings: No bruising, erythema, lesion or rash.  Neurological:     Mental Status: She is alert and oriented to person, place, and time. Mental status is at baseline.  Psychiatric:        Mood and Affect: Mood normal.        Behavior: Behavior normal.        Thought Content: Thought content normal.         Judgment: Judgment normal.     Results for orders placed or performed in visit on 01/13/18  CBC With Differential/Platelet  Result Value Ref Range   WBC 6.0 3.4 - 10.8 x10E3/uL   RBC 4.22 3.77 - 5.28 x10E6/uL   Hemoglobin 13.6 11.1 - 15.9 g/dL   Hematocrit 37.7 34.0 - 46.6 %   MCV 89 79 - 97 fL   MCH 32.2 26.6 - 33.0 pg   MCHC 36.1 (H) 31.5 - 35.7 g/dL   RDW 12.9 12.3 - 15.4 %   Platelets 223 150 - 379 x10E3/uL   Neutrophils 64 Not Estab. %   Lymphs 28 Not Estab. %   MID 8 Not Estab. %   Neutrophils Absolute 3.8 1.4 - 7.0 x10E3/uL   Lymphocytes Absolute 1.7 0.7 - 3.1 x10E3/uL   MID (Absolute) 0.5 0.1 - 1.6 X10E3/uL      Assessment & Plan:   Problem List Items Addressed This Visit      Other   Obesity    Continuing saxenda- will work on diet and add exercise. Continue to monitor.       Other Visit Diagnoses    Decreased libido    -  Primary   Will check labs. Await results. Call with any concerns.    Relevant Orders   CBC with Differential/Platelet (Completed)   Comprehensive metabolic panel (Completed)   TSH (Completed)   VITAMIN D 25 Hydroxy (Vit-D Deficiency, Fractures) (Completed)   LH (Completed)   FSH (Completed)   Estradiol (Completed)   DHEA-sulfate (Completed)   Testosterone, free, total([facility]) (Completed)   Diarrhea, unspecified type       Will check stool studies and FOBT- if normal, will need to see GI given family history of IBD.   Relevant Orders   Fecal occult blood, imunochemical([facility])   Stool Culture   Stool C-Diff Toxin Assay   Fecal leukocytes   Ova and parasite examination   Hematochezia       Will check stool studies and FOBT- if normal, will need to see GI given family history of IBD.   Relevant Orders   Fecal occult blood, imunochemical([facility])   Stool Culture   Stool C-Diff Toxin Assay   Fecal leukocytes   Ova and parasite examination       Follow up plan: Return if symptoms worsen or fail to  improve.    . This visit was completed via Doximity due to the restrictions of the COVID-19 pandemic. All issues as above were discussed and addressed. Physical exam was done as above through visual confirmation on Doximity. If it was felt that  the patient should be evaluated in the office, they were directed there. The patient verbally consented to this visit. . Location of the patient: home . Location of the provider: home . Those involved with this call:  . Provider: Olevia PerchesMegan Johnson, DO . CMA: Wilhemena DurieBrittany Russell, CMA . Front Desk/Registration: Adela Portshristan Williamson  . Time spent on call: 25 minutes with patient face to face via video conference. More than 50% of this time was spent in counseling and coordination of care. 40 minutes total spent in review of patient's record and preparation of their chart.

## 2019-06-25 ENCOUNTER — Other Ambulatory Visit: Payer: Self-pay

## 2019-06-25 ENCOUNTER — Other Ambulatory Visit: Payer: Managed Care, Other (non HMO)

## 2019-06-25 DIAGNOSIS — R6882 Decreased libido: Secondary | ICD-10-CM

## 2019-06-27 ENCOUNTER — Encounter: Payer: Self-pay | Admitting: Family Medicine

## 2019-06-27 LAB — CBC WITH DIFFERENTIAL/PLATELET
Basophils Absolute: 0.1 10*3/uL (ref 0.0–0.2)
Basos: 1 %
EOS (ABSOLUTE): 0.1 10*3/uL (ref 0.0–0.4)
Eos: 2 %
Hematocrit: 37.1 % (ref 34.0–46.6)
Hemoglobin: 12.6 g/dL (ref 11.1–15.9)
Immature Grans (Abs): 0 10*3/uL (ref 0.0–0.1)
Immature Granulocytes: 0 %
Lymphocytes Absolute: 1.6 10*3/uL (ref 0.7–3.1)
Lymphs: 28 %
MCH: 31 pg (ref 26.6–33.0)
MCHC: 34 g/dL (ref 31.5–35.7)
MCV: 91 fL (ref 79–97)
Monocytes Absolute: 0.4 10*3/uL (ref 0.1–0.9)
Monocytes: 6 %
Neutrophils Absolute: 3.7 10*3/uL (ref 1.4–7.0)
Neutrophils: 63 %
Platelets: 254 10*3/uL (ref 150–450)
RBC: 4.06 x10E6/uL (ref 3.77–5.28)
RDW: 12.6 % (ref 11.7–15.4)
WBC: 5.8 10*3/uL (ref 3.4–10.8)

## 2019-06-27 LAB — COMPREHENSIVE METABOLIC PANEL
ALT: 10 IU/L (ref 0–32)
AST: 13 IU/L (ref 0–40)
Albumin/Globulin Ratio: 1.8 (ref 1.2–2.2)
Albumin: 4.4 g/dL (ref 3.8–4.8)
Alkaline Phosphatase: 75 IU/L (ref 39–117)
BUN/Creatinine Ratio: 14 (ref 9–23)
BUN: 11 mg/dL (ref 6–20)
Bilirubin Total: 0.3 mg/dL (ref 0.0–1.2)
CO2: 23 mmol/L (ref 20–29)
Calcium: 9.6 mg/dL (ref 8.7–10.2)
Chloride: 102 mmol/L (ref 96–106)
Creatinine, Ser: 0.76 mg/dL (ref 0.57–1.00)
GFR calc Af Amer: 119 mL/min/{1.73_m2} (ref >59)
GFR calc non Af Amer: 103 mL/min/{1.73_m2} (ref >59)
Globulin, Total: 2.4 g/dL (ref 1.5–4.5)
Glucose: 72 mg/dL (ref 65–99)
Potassium: 3.7 mmol/L (ref 3.5–5.2)
Sodium: 141 mmol/L (ref 134–144)
Total Protein: 6.8 g/dL (ref 6.0–8.5)

## 2019-06-27 LAB — DHEA-SULFATE: DHEA-SO4: 314 ug/dL (ref 84.8–378.0)

## 2019-06-27 LAB — TESTOSTERONE, FREE, TOTAL, SHBG
Sex Hormone Binding: 83 nmol/L (ref 24.6–122.0)
Testosterone, Free: 1.5 pg/mL (ref 0.0–4.2)
Testosterone: 35 ng/dL (ref 8–48)

## 2019-06-27 LAB — ESTRADIOL: Estradiol: 55 pg/mL

## 2019-06-27 LAB — FOLLICLE STIMULATING HORMONE: FSH: 4 m[IU]/mL

## 2019-06-27 LAB — TSH: TSH: 1.89 u[IU]/mL (ref 0.450–4.500)

## 2019-06-27 LAB — VITAMIN D 25 HYDROXY (VIT D DEFICIENCY, FRACTURES): Vit D, 25-Hydroxy: 24.8 ng/mL — ABNORMAL LOW (ref 30.0–100.0)

## 2019-06-27 LAB — LUTEINIZING HORMONE: LH: 2.9 m[IU]/mL

## 2019-06-27 NOTE — Assessment & Plan Note (Signed)
Continuing saxenda- will work on diet and add exercise. Continue to monitor.

## 2019-08-12 NOTE — Progress Notes (Signed)
BP 109/70   Pulse 86   Temp 98.8 F (37.1 C)   Ht 5' 7.09" (1.704 m)   Wt 200 lb 4 oz (90.8 kg)   SpO2 98%   BMI 31.28 kg/m    Subjective:    Patient ID: [name], female    DOB: October 08, 1985, 33 y.o.   MRN: [id number]  HPI: [name] is a 33 y.o. female presenting on 08/13/2019 for comprehensive medical examination. Current medical complaints include:none  She currently lives with: husband and kids Menopausal Symptoms: no  Depression Screen done today and results listed below:  Depression screen [facility] 2/9 08/13/2019 06/23/2019 06/05/2018 07/04/2017 06/07/2016  Decreased Interest 0 1 0 0 2  Down, Depressed, Hopeless 0 0 0 0 1  PHQ - 2 Score 0 1 0 0 3  Altered sleeping - 0 3 0 3  Tired, decreased energy - 1 0 0 3  Change in appetite - 0 0 0 1  Feeling bad or failure about yourself  - 0 0 0 1  Trouble concentrating - 0 0 0 0  Moving slowly or fidgety/restless - 0 0 0 0  Suicidal thoughts - 0 0 0 0  PHQ-9 Score - 2 3 0 11  Difficult doing work/chores - Not difficult at all Not difficult at all Not difficult at all -    Past Medical History:  Past Medical History:  Diagnosis Date  . Abnormal Pap smear   . Anxiety   . Complication of anesthesia    N/V, passing out with epidural  . Gestational diabetes   . Headache(784.0)   . Ovarian cyst   . Preterm labor   . Seizures ([place])   . Urinary tract infection     Surgical History:  Past Surgical History:  Procedure Laterality Date  . repair of fr left foot    . THERAPEUTIC ABORTION    . TUBAL LIGATION  04/21/2011   Procedure: POST PARTUM TUBAL LIGATION;  Surgeon: Osborne Oman, MD;  Location: [facility];  Service: Gynecology;  Laterality: Bilateral;  Bilateral post partum tubal ligation with filshie clips.  . tubalectomy Bilateral 2014    Medications:  Current Outpatient Medications on File Prior to Visit  Medication Sig  . clobetasol ointment (TEMOVATE) 1.44 % Apply 1 application topically 2 (two) times  daily. (Patient not taking: Reported on 06/23/2019)  . Insulin Pen Needle (PEN NEEDLES) 32G X 5 MM MISC 1 each by Does not apply route daily.  . Liraglutide -Weight Management (SAXENDA) 18 MG/3ML SOPN Inject 3 mg into the skin daily.   No current facility-administered medications on file prior to visit.    Allergies:  Allergies  Allergen Reactions  . Contrave [Naltrexone-Bupropion Hcl Er] Nausea Only    irritability    Social History:  Social History   Socioeconomic History  . Marital status: Divorced    Spouse name: Not on file  . Number of children: Not on file  . Years of education: Not on file  . Highest education level: Not on file  Occupational History  . Not on file  Tobacco Use  . Smoking status: Never Smoker  . Smokeless tobacco: Never Used  Substance and Sexual Activity  . Alcohol use: Yes    Comment: occasion  . Drug use: No  . Sexual activity: Yes    Partners: Male    Birth control/protection: Surgical    Comment: tubaligation.  Other Topics Concern  . Not on file  Social History  Narrative  . Not on file   Social Determinants of Health   Financial Resource Strain:   . Difficulty of Paying Living Expenses: Not on file  Food Insecurity:   . Worried About Programme researcher, broadcasting/film/video in the Last Year: Not on file  . Ran Out of Food in the Last Year: Not on file  Transportation Needs:   . Lack of Transportation (Medical): Not on file  . Lack of Transportation (Non-Medical): Not on file  Physical Activity:   . Days of Exercise per Week: Not on file  . Minutes of Exercise per Session: Not on file  Stress:   . Feeling of Stress : Not on file  Social Connections:   . Frequency of Communication with Friends and Family: Not on file  . Frequency of Social Gatherings with Friends and Family: Not on file  . Attends Religious Services: Not on file  . Active Member of Clubs or Organizations: Not on file  . Attends Banker Meetings: Not on file  . Marital  Status: Not on file  Intimate Partner Violence:   . Fear of Current or Ex-Partner: Not on file  . Emotionally Abused: Not on file  . Physically Abused: Not on file  . Sexually Abused: Not on file   Social History   Tobacco Use  Smoking Status Never Smoker  Smokeless Tobacco Never Used   Social History   Substance and Sexual Activity  Alcohol Use Yes   Comment: occasion    Family History:  Family History  Problem Relation Age of Onset  . Hyperthyroidism Father   . Stroke Father   . Hypertension Father   . Hypothyroidism Father   . Hypothyroidism Mother   . Cancer Mother        pancreatic  . Cancer Paternal Grandfather        leukemia  . Multiple sclerosis Paternal Aunt   . Diabetes Maternal Grandmother   . COPD Maternal Grandfather   . Stroke Paternal Grandmother   . Alzheimer's disease Paternal Grandmother   . Heart disease Neg Hx     Past medical history, surgical history, medications, allergies, family history and social history reviewed with patient today and changes made to appropriate areas of the chart.   Review of Systems  Constitutional: Negative.   HENT: Negative.   Eyes: Negative.   Respiratory: Negative.   Cardiovascular: Negative.   Gastrointestinal: Positive for blood in stool (3-4x a week), diarrhea and heartburn. Negative for abdominal pain, constipation, melena, nausea and vomiting.  Genitourinary: Negative.   Musculoskeletal: Positive for myalgias. Negative for back pain, falls, joint pain and neck pain.  Skin: Negative.   Neurological: Negative.   Endo/Heme/Allergies: Negative.   Psychiatric/Behavioral: Negative.     All other ROS negative except what is listed above and in the HPI.      Objective:    BP 109/70   Pulse 86   Temp 98.8 F (37.1 C)   Ht 5' 7.09" (1.704 m)   Wt 200 lb 4 oz (90.8 kg)   SpO2 98%   BMI 31.28 kg/m   Wt Readings from Last 3 Encounters:  08/13/19 200 lb 4 oz (90.8 kg)  02/04/19 196 lb (88.9 kg)    12/07/18 193 lb (87.5 kg)    Physical Exam Vitals and nursing note reviewed.  Constitutional:      General: She is not in acute distress.    Appearance: Normal appearance. She is not ill-appearing, toxic-appearing or diaphoretic.  HENT:     Head: Normocephalic and atraumatic.     Right Ear: Tympanic membrane, ear canal and external ear normal. There is no impacted cerumen.     Left Ear: Tympanic membrane, ear canal and external ear normal. There is no impacted cerumen.     Nose: Nose normal. No congestion or rhinorrhea.     Mouth/Throat:     Mouth: Mucous membranes are moist.     Pharynx: Oropharynx is clear. No oropharyngeal exudate or posterior oropharyngeal erythema.  Eyes:     General: No scleral icterus.       Right eye: No discharge.        Left eye: No discharge.     Extraocular Movements: Extraocular movements intact.     Conjunctiva/sclera: Conjunctivae normal.     Pupils: Pupils are equal, round, and reactive to light.  Neck:     Vascular: No carotid bruit.  Cardiovascular:     Rate and Rhythm: Normal rate and regular rhythm.     Pulses: Normal pulses.     Heart sounds: No murmur. No friction rub. No gallop.   Pulmonary:     Effort: Pulmonary effort is normal. No respiratory distress.     Breath sounds: Normal breath sounds. No stridor. No wheezing, rhonchi or rales.  Chest:     Chest wall: No tenderness.  Abdominal:     General: Abdomen is flat. Bowel sounds are normal. There is no distension.     Palpations: Abdomen is soft. There is no mass.     Tenderness: There is no abdominal tenderness. There is no right CVA tenderness, left CVA tenderness, guarding or rebound.     Hernia: No hernia is present.  Genitourinary:    Comments: Breast and pelvic exams deferred with shared decision making Musculoskeletal:        General: No swelling, tenderness, deformity or signs of injury.     Cervical back: Normal range of motion and neck supple. No rigidity. No muscular  tenderness.     Right lower leg: No edema.     Left lower leg: No edema.  Lymphadenopathy:     Cervical: No cervical adenopathy.  Skin:    General: Skin is warm and dry.     Capillary Refill: Capillary refill takes less than 2 seconds.     Coloration: Skin is not jaundiced or pale.     Findings: No bruising, erythema, lesion or rash.  Neurological:     General: No focal deficit present.     Mental Status: She is alert and oriented to person, place, and time. Mental status is at baseline.     Cranial Nerves: No cranial nerve deficit.     Sensory: No sensory deficit.     Motor: No weakness.     Coordination: Coordination normal.     Gait: Gait normal.     Deep Tendon Reflexes: Reflexes normal.  Psychiatric:        Mood and Affect: Mood normal.        Behavior: Behavior normal.        Thought Content: Thought content normal.        Judgment: Judgment normal.     Results for orders placed or performed in visit on 06/25/19  CBC with Differential/Platelet  Result Value Ref Range   WBC 5.8 3.4 - 10.8 x10E3/uL   RBC 4.06 3.77 - 5.28 x10E6/uL   Hemoglobin 12.6 11.1 - 15.9 g/dL   Hematocrit 16.137.1 09.634.0 - 46.6 %  MCV 91 79 - 97 fL   MCH 31.0 26.6 - 33.0 pg   MCHC 34.0 31.5 - 35.7 g/dL   RDW 29.5 28.4 - 13.2 %   Platelets 254 150 - 450 x10E3/uL   Neutrophils 63 Not Estab. %   Lymphs 28 Not Estab. %   Monocytes 6 Not Estab. %   Eos 2 Not Estab. %   Basos 1 Not Estab. %   Neutrophils Absolute 3.7 1.4 - 7.0 x10E3/uL   Lymphocytes Absolute 1.6 0.7 - 3.1 x10E3/uL   Monocytes Absolute 0.4 0.1 - 0.9 x10E3/uL   EOS (ABSOLUTE) 0.1 0.0 - 0.4 x10E3/uL   Basophils Absolute 0.1 0.0 - 0.2 x10E3/uL   Immature Granulocytes 0 Not Estab. %   Immature Grans (Abs) 0.0 0.0 - 0.1 x10E3/uL  Comprehensive metabolic panel  Result Value Ref Range   Glucose 72 65 - 99 mg/dL   BUN 11 6 - 20 mg/dL   Creatinine, Ser 4.40 0.57 - 1.00 mg/dL   GFR calc non Af Amer 103 >59 mL/min/1.73   GFR calc Af Amer  119 >59 mL/min/1.73   BUN/Creatinine Ratio 14 9 - 23   Sodium 141 134 - 144 mmol/L   Potassium 3.7 3.5 - 5.2 mmol/L   Chloride 102 96 - 106 mmol/L   CO2 23 20 - 29 mmol/L   Calcium 9.6 8.7 - 10.2 mg/dL   Total Protein 6.8 6.0 - 8.5 g/dL   Albumin 4.4 3.8 - 4.8 g/dL   Globulin, Total 2.4 1.5 - 4.5 g/dL   Albumin/Globulin Ratio 1.8 1.2 - 2.2   Bilirubin Total 0.3 0.0 - 1.2 mg/dL   Alkaline Phosphatase 75 39 - 117 IU/L   AST 13 0 - 40 IU/L   ALT 10 0 - 32 IU/L  TSH  Result Value Ref Range   TSH 1.890 0.450 - 4.500 uIU/mL  VITAMIN D 25 Hydroxy (Vit-D Deficiency, Fractures)  Result Value Ref Range   Vit D, 25-Hydroxy 24.8 (L) 30.0 - 100.0 ng/mL  LH  Result Value Ref Range   LH 2.9 mIU/mL  FSH  Result Value Ref Range   FSH 4.0 mIU/mL  Estradiol  Result Value Ref Range   Estradiol 55.0 pg/mL  DHEA-sulfate  Result Value Ref Range   DHEA-SO4 314.0 84.8 - 378.0 ug/dL  Testosterone, free, total([facility])  Result Value Ref Range   Testosterone 35 8 - 48 ng/dL   Testosterone, Free 1.5 0.0 - 4.2 pg/mL   Sex Hormone Binding 83.0 24.6 - 122.0 nmol/L      Assessment & Plan:   Problem List Items Addressed This Visit    None    Visit Diagnoses    Routine general medical examination at a health care facility    -  Primary   Vaccines up to date/declined. Screening labs checked today. Pap UTD through GYN. Continue diet and exercise. Call with any concerns.    Screening for cholesterol level       Labs drawn today. Await results.    Relevant Orders   Lipid Panel w/o Chol/HDL Ratio OUT   Diarrhea, unspecified type       Never got results of stool studies- will look into results, if normal, may need to see GI.       Follow up plan: Return in about 6 months (around 02/11/2020).   LABORATORY TESTING:  - Pap smear: up to date  IMMUNIZATIONS:   - Tdap: Tetanus vaccination status reviewed: last tetanus booster within 10 years. -  Influenza: Refused - Pneumovax: Not  applicable  PATIENT COUNSELING:   Advised to take 1 mg of folate supplement per day if capable of pregnancy.   Sexuality: Discussed sexually transmitted diseases, partner selection, use of condoms, avoidance of unintended pregnancy  and contraceptive alternatives.   Advised to avoid cigarette smoking.  I discussed with the patient that most people either abstain from alcohol or drink within safe limits (<=14/week and <=4 drinks/occasion for males, <=7/weeks and <= 3 drinks/occasion for females) and that the risk for alcohol disorders and other health effects rises proportionally with the number of drinks per week and how often a drinker exceeds daily limits.  Discussed cessation/primary prevention of drug use and availability of treatment for abuse.   Diet: Encouraged to adjust caloric intake to maintain  or achieve ideal body weight, to reduce intake of dietary saturated fat and total fat, to limit sodium intake by avoiding high sodium foods and not adding table salt, and to maintain adequate dietary potassium and calcium preferably from fresh fruits, vegetables, and low-fat dairy products.    stressed the importance of regular exercise  Injury prevention: Discussed safety belts, safety helmets, smoke detector, smoking near bedding or upholstery.   Dental health: Discussed importance of regular tooth brushing, flossing, and dental visits.    NEXT PREVENTATIVE PHYSICAL DUE IN 1 YEAR. Return in about 6 months (around 02/11/2020).

## 2019-08-13 ENCOUNTER — Other Ambulatory Visit: Payer: Self-pay

## 2019-08-13 ENCOUNTER — Encounter: Payer: Self-pay | Admitting: Family Medicine

## 2019-08-13 ENCOUNTER — Ambulatory Visit (INDEPENDENT_AMBULATORY_CARE_PROVIDER_SITE_OTHER): Payer: Managed Care, Other (non HMO) | Admitting: Family Medicine

## 2019-08-13 VITALS — BP 109/70 | HR 86 | Temp 98.8°F | Ht 67.09 in | Wt 200.2 lb

## 2019-08-13 DIAGNOSIS — R197 Diarrhea, unspecified: Secondary | ICD-10-CM | POA: Diagnosis not present

## 2019-08-13 DIAGNOSIS — Z0000 Encounter for general adult medical examination without abnormal findings: Secondary | ICD-10-CM | POA: Diagnosis not present

## 2019-08-13 DIAGNOSIS — Z13220 Encounter for screening for lipoid disorders: Secondary | ICD-10-CM | POA: Diagnosis not present

## 2019-08-13 NOTE — Patient Instructions (Signed)
Health Maintenance, Female Adopting a healthy lifestyle and getting preventive care are important in promoting health and wellness. Ask your health care provider about:  The right schedule for you to have regular tests and exams.  Things you can do on your own to prevent diseases and keep yourself healthy. What should I know about diet, weight, and exercise? Eat a healthy diet   Eat a diet that includes plenty of vegetables, fruits, low-fat dairy products, and lean protein.  Do not eat a lot of foods that are high in solid fats, added sugars, or sodium. Maintain a healthy weight Body mass index (BMI) is used to identify weight problems. It estimates body fat based on height and weight. Your health care provider can help determine your BMI and help you achieve or maintain a healthy weight. Get regular exercise Get regular exercise. This is one of the most important things you can do for your health. Most adults should:  Exercise for at least 150 minutes each week. The exercise should increase your heart rate and make you sweat (moderate-intensity exercise).  Do strengthening exercises at least twice a week. This is in addition to the moderate-intensity exercise.  Spend less time sitting. Even light physical activity can be beneficial. Watch cholesterol and blood lipids Have your blood tested for lipids and cholesterol at 33 years of age, then have this test every 5 years. Have your cholesterol levels checked more often if:  Your lipid or cholesterol levels are high.  You are older than 33 years of age.  You are at high risk for heart disease. What should I know about cancer screening? Depending on your health history and family history, you may need to have cancer screening at various ages. This may include screening for:  Breast cancer.  Cervical cancer.  Colorectal cancer.  Skin cancer.  Lung cancer. What should I know about heart disease, diabetes, and high blood  pressure? Blood pressure and heart disease  High blood pressure causes heart disease and increases the risk of stroke. This is more likely to develop in people who have high blood pressure readings, are of African descent, or are overweight.  Have your blood pressure checked: ? Every 3-5 years if you are 18-39 years of age. ? Every year if you are 40 years old or older. Diabetes Have regular diabetes screenings. This checks your fasting blood sugar level. Have the screening done:  Once every three years after age 40 if you are at a normal weight and have a low risk for diabetes.  More often and at a younger age if you are overweight or have a high risk for diabetes. What should I know about preventing infection? Hepatitis B If you have a higher risk for hepatitis B, you should be screened for this virus. Talk with your health care provider to find out if you are at risk for hepatitis B infection. Hepatitis C Testing is recommended for:  Everyone born from 1945 through 1965.  Anyone with known risk factors for hepatitis C. Sexually transmitted infections (STIs)  Get screened for STIs, including gonorrhea and chlamydia, if: ? You are sexually active and are younger than 33 years of age. ? You are older than 33 years of age and your health care provider tells you that you are at risk for this type of infection. ? Your sexual activity has changed since you were last screened, and you are at increased risk for chlamydia or gonorrhea. Ask your health care provider if   you are at risk.  Ask your health care provider about whether you are at high risk for HIV. Your health care provider may recommend a prescription medicine to help prevent HIV infection. If you choose to take medicine to prevent HIV, you should first get tested for HIV. You should then be tested every 3 months for as long as you are taking the medicine. Pregnancy  If you are about to stop having your period (premenopausal) and  you may become pregnant, seek counseling before you get pregnant.  Take 400 to 800 micrograms (mcg) of folic acid every day if you become pregnant.  Ask for birth control (contraception) if you want to prevent pregnancy. Osteoporosis and menopause Osteoporosis is a disease in which the bones lose minerals and strength with aging. This can result in bone fractures. If you are 65 years old or older, or if you are at risk for osteoporosis and fractures, ask your health care provider if you should:  Be screened for bone loss.  Take a calcium or vitamin D supplement to lower your risk of fractures.  Be given hormone replacement therapy (HRT) to treat symptoms of menopause. Follow these instructions at home: Lifestyle  Do not use any products that contain nicotine or tobacco, such as cigarettes, e-cigarettes, and chewing tobacco. If you need help quitting, ask your health care provider.  Do not use street drugs.  Do not share needles.  Ask your health care provider for help if you need support or information about quitting drugs. Alcohol use  Do not drink alcohol if: ? Your health care provider tells you not to drink. ? You are pregnant, may be pregnant, or are planning to become pregnant.  If you drink alcohol: ? Limit how much you use to 0-1 drink a day. ? Limit intake if you are breastfeeding.  Be aware of how much alcohol is in your drink. In the [place] one drink equals one 12 oz bottle of beer (355 mL), one 5 oz glass of wine (148 mL), or one 1 oz glass of hard liquor (44 mL). General instructions  Schedule regular health, dental, and eye exams.  Stay current with your vaccines.  Tell your health care provider if: ? You often feel depressed. ? You have ever been abused or do not feel safe at home. Summary  Adopting a healthy lifestyle and getting preventive care are important in promoting health and wellness.  Follow your health care provider's instructions about healthy  diet, exercising, and getting tested or screened for diseases.  Follow your health care provider's instructions on monitoring your cholesterol and blood pressure. This information is not intended to replace advice given to you by your health care provider. Make sure you discuss any questions you have with your health care provider. Document Released: 03/04/2011 Document Revised: 08/12/2018 Document Reviewed: 08/12/2018 Elsevier Patient Education  2020 [employer]  

## 2019-08-14 LAB — LIPID PANEL W/O CHOL/HDL RATIO
Cholesterol, Total: 151 mg/dL (ref 100–199)
HDL: 40 mg/dL (ref >39)
LDL Chol Calc (NIH): 93 mg/dL (ref 0–99)
Triglycerides: 96 mg/dL (ref 0–149)
VLDL Cholesterol Cal: 18 mg/dL (ref 5–40)

## 2019-08-16 ENCOUNTER — Other Ambulatory Visit: Payer: Self-pay

## 2019-08-16 DIAGNOSIS — R197 Diarrhea, unspecified: Secondary | ICD-10-CM

## 2019-08-16 DIAGNOSIS — K921 Melena: Secondary | ICD-10-CM

## 2019-08-17 LAB — FECAL OCCULT BLOOD, IMMUNOCHEMICAL: Fecal Occult Bld: NEGATIVE

## 2019-08-18 LAB — CLOSTRIDIUM DIFFICILE EIA: C difficile Toxins A+B, EIA: NEGATIVE

## 2019-08-19 LAB — OVA AND PARASITE EXAMINATION

## 2019-08-20 ENCOUNTER — Other Ambulatory Visit: Payer: Self-pay | Admitting: Family Medicine

## 2019-08-20 ENCOUNTER — Telehealth: Payer: Self-pay | Admitting: Family Medicine

## 2019-08-20 DIAGNOSIS — R197 Diarrhea, unspecified: Secondary | ICD-10-CM

## 2019-08-20 DIAGNOSIS — K921 Melena: Secondary | ICD-10-CM

## 2019-08-20 NOTE — Telephone Encounter (Signed)
Still waiting on a couple of results. Patient contacted through mychart.

## 2019-08-20 NOTE — Telephone Encounter (Signed)
Copied from [place] [id number]. Topic: General - Other >> Aug 20, 2019  9:33 AM Greggory Keen D wrote: Reason for CRM: pt called saying she had stool labs sent in on the 14th and is waiting for the results  CB#  [id number]

## 2019-08-21 LAB — STOOL CULTURE: E coli, Shiga toxin Assay: NEGATIVE

## 2019-08-24 LAB — FECAL LEUKOCYTES

## 2019-09-20 ENCOUNTER — Ambulatory Visit: Payer: Managed Care, Other (non HMO) | Admitting: Obstetrics and Gynecology

## 2019-10-25 ENCOUNTER — Other Ambulatory Visit: Payer: Self-pay

## 2019-10-25 ENCOUNTER — Other Ambulatory Visit: Payer: Self-pay | Admitting: Family Medicine

## 2019-10-25 ENCOUNTER — Ambulatory Visit (INDEPENDENT_AMBULATORY_CARE_PROVIDER_SITE_OTHER): Payer: Managed Care, Other (non HMO) | Admitting: Gastroenterology

## 2019-10-25 VITALS — BP 104/64 | HR 97 | Temp 98.2°F | Ht 67.0 in | Wt 202.4 lb

## 2019-10-25 DIAGNOSIS — K921 Melena: Secondary | ICD-10-CM

## 2019-10-25 DIAGNOSIS — K5900 Constipation, unspecified: Secondary | ICD-10-CM

## 2019-10-25 DIAGNOSIS — R194 Change in bowel habit: Secondary | ICD-10-CM

## 2019-10-25 NOTE — Patient Instructions (Signed)

## 2019-10-25 NOTE — Progress Notes (Signed)
Jonathon Bellows MD, MRCP(U.K) [address]  [place]  [place], [place] [address]  Main: [phone]  Fax: [phone]   Gastroenterology Consultation  Referring Provider:     Valerie Roys, DO Primary Care Physician:  Valerie Roys, DO Primary Gastroenterologist:  Dr. Jonathon Bellows  Reason for Consultation: Hematochezia        HPI:   [name] is a 34 y.o. y/o female referred for consultation & management  by Dr. Wynetta Emery, Megan P, DO.     She was referred by Dr. Wynetta Emery for diarrhea, hematochezia.  Seen in December 2020 for diarrhea.  Stool test for C. difficile, ova and parasites, fecal leukocytes, stool culture were negative.  Fecal occult blood test was also negative.  She takes the liraglutide for helping with weight loss.  She states that she was doing well till about a year back and then developed severe constipation having a bowel movement once in 2 weeks followed by significant diarrhea.  When she does have a bowel movement is very hard and causes pain as well as blood in the toilet bowl.  At times her stools are black in color.  Denies any NSAIDs.  She does have lower abdominal cramping plan improved bloating which precedes a bowel movement and relieved after.  She does eat a lot of fruit and vegetable but has not tried any supplements to help her have a good bowel movement.  No family history of colon cancer or polyps no prior endoscopic evaluation.  Mother passed away due to pancreatic cancer.   Past Medical History:  Diagnosis Date  . Abnormal Pap smear   . Anxiety   . Complication of anesthesia    N/V, passing out with epidural  . Gestational diabetes   . Headache(784.0)   . Ovarian cyst   . Preterm labor   . Seizures ([facility])   . Urinary tract infection     Past Surgical History:  Procedure Laterality Date  . repair of fr left foot    . THERAPEUTIC ABORTION    . TUBAL LIGATION  04/21/2011   Procedure: POST PARTUM TUBAL LIGATION;  Surgeon: Osborne Oman, MD;  Location: [facility];  Service: Gynecology;  Laterality: Bilateral;  Bilateral post partum tubal ligation with filshie clips.  . tubalectomy Bilateral 2014    Prior to Admission medications   Medication Sig Start Date End Date Taking? Authorizing Provider  clobetasol ointment (TEMOVATE) 7.01 % Apply 1 application topically 2 (two) times daily. Patient not taking: Reported on 06/23/2019 12/07/18   Volney American, PA-C  Insulin Pen Needle (PEN NEEDLES) 32G X 5 MM MISC 1 each by Does not apply route daily. 02/04/19   Johnson, Megan P, DO  SAXENDA 18 MG/3ML SOPN INJECT 3 MG INTO THE SKIN DAILY. 10/25/19   Valerie Roys, DO    Family History  Problem Relation Age of Onset  . Hyperthyroidism Father   . Stroke Father   . Hypertension Father   . Hypothyroidism Father   . Hypothyroidism Mother   . Cancer Mother        pancreatic  . Cancer Paternal Grandfather        leukemia  . Multiple sclerosis Paternal Aunt   . Diabetes Maternal Grandmother   . COPD Maternal Grandfather   . Stroke Paternal Grandmother   . Alzheimer's disease Paternal Grandmother   . Heart disease Neg Hx      Social History   Tobacco Use  .  Smoking status: Never Smoker  . Smokeless tobacco: Never Used  Substance Use Topics  . Alcohol use: Yes    Comment: occasion  . Drug use: No    Allergies as of 10/25/2019 - Review Complete 08/13/2019  Allergen Reaction Noted  . Contrave [naltrexone-bupropion hcl er] Nausea Only 06/06/2017    Review of Systems:    All systems reviewed and negative except where noted in HPI.   Physical Exam:  There were no vitals taken for this visit. No LMP recorded. Psych:  Alert and cooperative. Normal mood and affect. General:   Alert,  Well-developed, well-nourished, pleasant and cooperative in NAD Head:  Normocephalic and atraumatic. Eyes:  Sclera clear, no icterus.   Conjunctiva pink. Ears:  Normal auditory acuity. Lungs:  Respirations even and unlabored.   Clear throughout to auscultation.   No wheezes, crackles, or rhonchi. No acute distress. Heart:  Regular rate and rhythm; no murmurs, clicks, rubs, or gallops. Abdomen:  Normal bowel sounds.  No bruits.  Soft, non-tender and non-distended without masses, hepatosplenomegaly or hernias noted.  No guarding or rebound tenderness.    Neurologic:  Alert and oriented x3;  grossly normal neurologically. Psych:  Alert and cooperative. Normal mood and affect.  Imaging Studies: No results found.  Assessment and Plan:   [name] is a 34 y.o. y/o female has been referred for blood per rectum.  Her history is very suggestive of constipation with overflow diarrhea.  Recent change in bowel movements over a year. Plan 1.  Commence on MiraLAX once daily. 2.  Increase dietary fiber to a target of 25 g/day.  Patient information provided. 3.  Check CBC, CMP, TSH. 4.  EGD plus colonoscopy to evaluate melena as well as change in bowel movements. 5.  Follow-up in 2 weeks telephone visit to determine if MiraLAX is helping if not will commence on Linzess.  I have discussed alternative options, risks & benefits,  which include, but are not limited to, bleeding, infection, perforation,respiratory complication & drug reaction.  The patient agrees with this plan & written consent will be obtained.     Follow up in 2 to 3 weeks telephone visit  Dr Wyline Mood MD,MRCP(U.K)

## 2019-10-26 LAB — CBC
Hematocrit: 39 % (ref 34.0–46.6)
Hemoglobin: 13.3 g/dL (ref 11.1–15.9)
MCH: 30.9 pg (ref 26.6–33.0)
MCHC: 34.1 g/dL (ref 31.5–35.7)
MCV: 91 fL (ref 79–97)
Platelets: 276 10*3/uL (ref 150–450)
RBC: 4.31 x10E6/uL (ref 3.77–5.28)
RDW: 12.3 % (ref 11.7–15.4)
WBC: 5.2 10*3/uL (ref 3.4–10.8)

## 2019-10-26 LAB — COMPREHENSIVE METABOLIC PANEL
ALT: 14 IU/L (ref 0–32)
AST: 9 IU/L (ref 0–40)
Albumin/Globulin Ratio: 2 (ref 1.2–2.2)
Albumin: 4.5 g/dL (ref 3.8–4.8)
Alkaline Phosphatase: 76 IU/L (ref 39–117)
BUN/Creatinine Ratio: 17 (ref 9–23)
BUN: 12 mg/dL (ref 6–20)
Bilirubin Total: 0.2 mg/dL (ref 0.0–1.2)
CO2: 23 mmol/L (ref 20–29)
Calcium: 9.6 mg/dL (ref 8.7–10.2)
Chloride: 105 mmol/L (ref 96–106)
Creatinine, Ser: 0.69 mg/dL (ref 0.57–1.00)
GFR calc Af Amer: 132 mL/min/{1.73_m2} (ref >59)
GFR calc non Af Amer: 115 mL/min/{1.73_m2} (ref >59)
Globulin, Total: 2.2 g/dL (ref 1.5–4.5)
Glucose: 93 mg/dL (ref 65–99)
Potassium: 4.7 mmol/L (ref 3.5–5.2)
Sodium: 143 mmol/L (ref 134–144)
Total Protein: 6.7 g/dL (ref 6.0–8.5)

## 2019-10-26 LAB — TSH: TSH: 1.86 u[IU]/mL (ref 0.450–4.500)

## 2019-10-28 ENCOUNTER — Encounter: Payer: Self-pay | Admitting: Gastroenterology

## 2019-11-05 ENCOUNTER — Telehealth: Payer: Self-pay | Admitting: Gastroenterology

## 2019-11-05 NOTE — Telephone Encounter (Signed)
Patient called to cancel her colonoscopy on 11-19-2019. She checked with her insurance & is unable to afford the cost at this time.

## 2019-11-05 NOTE — Telephone Encounter (Signed)
[facility] has been notified.

## 2019-11-16 ENCOUNTER — Telehealth: Payer: Self-pay

## 2019-11-16 ENCOUNTER — Encounter (INDEPENDENT_AMBULATORY_CARE_PROVIDER_SITE_OTHER): Payer: Managed Care, Other (non HMO) | Admitting: Gastroenterology

## 2019-11-16 ENCOUNTER — Encounter: Payer: Self-pay | Admitting: Gastroenterology

## 2019-11-16 DIAGNOSIS — Z5329 Procedure and treatment not carried out because of patient's decision for other reasons: Secondary | ICD-10-CM

## 2019-11-16 DIAGNOSIS — K921 Melena: Secondary | ICD-10-CM

## 2019-11-16 NOTE — Progress Notes (Signed)
No charge. 

## 2019-11-16 NOTE — Telephone Encounter (Signed)
Called pt to pre-chart for today's e-visit with Dr. Anna  Unable to contact. LVM to return call 

## 2019-11-19 ENCOUNTER — Ambulatory Visit: Admit: 2019-11-19 | Payer: Managed Care, Other (non HMO) | Admitting: Gastroenterology

## 2019-11-19 SURGERY — COLONOSCOPY WITH PROPOFOL
Anesthesia: Choice

## 2020-02-11 ENCOUNTER — Ambulatory Visit: Payer: Managed Care, Other (non HMO) | Admitting: Family Medicine

## 2020-02-15 ENCOUNTER — Other Ambulatory Visit: Payer: Self-pay

## 2020-02-15 ENCOUNTER — Encounter: Payer: Self-pay | Admitting: Obstetrics and Gynecology

## 2020-02-15 ENCOUNTER — Ambulatory Visit (INDEPENDENT_AMBULATORY_CARE_PROVIDER_SITE_OTHER): Payer: Managed Care, Other (non HMO) | Admitting: Obstetrics and Gynecology

## 2020-02-15 VITALS — BP 110/70 | Ht 68.0 in | Wt 205.0 lb

## 2020-02-15 DIAGNOSIS — B9689 Other specified bacterial agents as the cause of diseases classified elsewhere: Secondary | ICD-10-CM | POA: Diagnosis not present

## 2020-02-15 DIAGNOSIS — N760 Acute vaginitis: Secondary | ICD-10-CM

## 2020-02-15 LAB — POCT WET PREP WITH KOH
Clue Cells Wet Prep HPF POC: POSITIVE
KOH Prep POC: POSITIVE — AB
Trichomonas, UA: NEGATIVE
Yeast Wet Prep HPF POC: NEGATIVE

## 2020-02-15 MED ORDER — METRONIDAZOLE 500 MG PO TABS: 500.0000 mg | ORAL_TABLET | Freq: Two times a day (BID) | ORAL | 0 refills | Status: AC

## 2020-02-15 NOTE — Patient Instructions (Signed)
I value your feedback and entrusting us with your care. If you get a [facility] patient survey, I would appreciate you taking the time to let us know about your experience today. Thank you!  As of August 12, 2019, your lab results will be released to your MyChart immediately, before I even have a chance to see them. Please give me time to review them and contact you if there are any abnormalities. Thank you for your patience.  

## 2020-02-15 NOTE — Progress Notes (Signed)
Dorcas Carrow, DO   Chief Complaint  Patient presents with  . Vaginal Discharge    itchiness/irritation, no odor x 4 days    HPI:      [name] is a 34 y.o. [id number] whose LMP was Patient's last menstrual period was 01/30/2020 (approximate)., presents today for increased d/c with irritation and odor for 4 days. No meds to treat. No hx of BV in past. Has recently changed soaps and been in damp bathing suit. No prior abx use.  No urin sx, no LBP, pelvic pain, fevers. She is sex active, no new partners.   Neg pap/neg HPV DNA 9/18  Past Medical History:  Diagnosis Date  . Abnormal Pap smear   . Anxiety   . Complication of anesthesia    N/V, passing out with epidural  . Gestational diabetes   . Headache(784.0)   . Ovarian cyst   . Preterm labor   . Seizures ([facility])   . Urinary tract infection     Past Surgical History:  Procedure Laterality Date  . repair of fr left foot    . THERAPEUTIC ABORTION    . TUBAL LIGATION  04/21/2011   Procedure: POST PARTUM TUBAL LIGATION;  Surgeon: Tereso Newcomer, MD;  Location: [facility];  Service: Gynecology;  Laterality: Bilateral;  Bilateral post partum tubal ligation with filshie clips.  . tubalectomy Bilateral 2014    Family History  Problem Relation Age of Onset  . Hyperthyroidism Father   . Stroke Father   . Hypertension Father   . Hypothyroidism Father   . Hypothyroidism Mother   . Cancer Mother        pancreatic  . Cancer Paternal Grandfather        leukemia  . Multiple sclerosis Paternal Aunt   . Diabetes Maternal Grandmother   . COPD Maternal Grandfather   . Stroke Paternal Grandmother   . Alzheimer's disease Paternal Grandmother   . Heart disease Neg Hx     Social History   Socioeconomic History  . Marital status: Divorced    Spouse name: Not on file  . Number of children: Not on file  . Years of education: Not on file  . Highest education level: Not on file  Occupational History  . Not on file    Tobacco Use  . Smoking status: Never Smoker  . Smokeless tobacco: Never Used  Vaping Use  . Vaping Use: Never used  Substance and Sexual Activity  . Alcohol use: Yes    Comment: occasion  . Drug use: No  . Sexual activity: Yes    Partners: Male    Birth control/protection: Surgical    Comment: tubaligation.  Other Topics Concern  . Not on file  Social History Narrative  . Not on file   Social Determinants of Health   Financial Resource Strain:   . Difficulty of Paying Living Expenses:   Food Insecurity:   . Worried About Programme researcher, broadcasting/film/video in the Last Year:   . Barista in the Last Year:   Transportation Needs:   . Freight forwarder (Medical):   Marland Kitchen Lack of Transportation (Non-Medical):   Physical Activity:   . Days of Exercise per Week:   . Minutes of Exercise per Session:   Stress:   . Feeling of Stress :   Social Connections:   . Frequency of Communication with Friends and Family:   . Frequency of Social Gatherings with Friends and  Family:   . Attends Religious Services:   . Active Member of Clubs or Organizations:   . Attends Banker Meetings:   Marland Kitchen Marital Status:   Intimate Partner Violence:   . Fear of Current or Ex-Partner:   . Emotionally Abused:   Marland Kitchen Physically Abused:   . Sexually Abused:     Outpatient Medications Prior to Visit  Medication Sig Dispense Refill  . clobetasol ointment (TEMOVATE) 0.05 % Apply 1 application topically 2 (two) times daily. (Patient not taking: Reported on 06/23/2019) 30 g 0  . Insulin Pen Needle (PEN NEEDLES) 32G X 5 MM MISC 1 each by Does not apply route daily. 100 each 4  . SAXENDA 18 MG/3ML SOPN INJECT 3 MG INTO THE SKIN DAILY. 15 mL 1   No facility-administered medications prior to visit.      ROS:  Review of Systems  Constitutional: Negative for fever.  Gastrointestinal: Negative for blood in stool, constipation, diarrhea, nausea and vomiting.  Genitourinary: Positive for vaginal  discharge. Negative for dyspareunia, dysuria, flank pain, frequency, hematuria, urgency, vaginal bleeding and vaginal pain.  Musculoskeletal: Negative for back pain.  Skin: Negative for rash.   BREAST: No symptoms   OBJECTIVE:   Vitals:  BP 110/70   Ht 5\' 8"  (1.727 m)   Wt 205 lb (93 kg)   LMP 01/30/2020 (Approximate)   BMI 31.17 kg/m   Physical Exam Vitals reviewed.  Constitutional:      Appearance: She is well-developed.  Pulmonary:     Effort: Pulmonary effort is normal.  Genitourinary:    General: Normal vulva.     Pubic Area: No rash.      Labia:        Right: No rash, tenderness or lesion.        Left: No rash, tenderness or lesion.      Vagina: Vaginal discharge present. No erythema or tenderness.     Cervix: Normal.     Uterus: Normal. Not enlarged and not tender.      Adnexa: Right adnexa normal and left adnexa normal.       Right: No mass or tenderness.         Left: No mass or tenderness.    Musculoskeletal:        General: Normal range of motion.     Cervical back: Normal range of motion.  Skin:    General: Skin is warm and dry.  Neurological:     General: No focal deficit present.     Mental Status: She is alert and oriented to person, place, and time.  Psychiatric:        Mood and Affect: Mood normal.        Behavior: Behavior normal.        Thought Content: Thought content normal.        Judgment: Judgment normal.     Results: Results for orders placed or performed in visit on 02/15/20 (from the past 24 hour(s))  POCT Wet Prep with KOH     Status: Abnormal   Collection Time: 02/15/20 11:50 AM  Result Value Ref Range   Trichomonas, UA Negative    Clue Cells Wet Prep HPF POC pos    Epithelial Wet Prep HPF POC     Yeast Wet Prep HPF POC neg    Bacteria Wet Prep HPF POC     RBC Wet Prep HPF POC     WBC Wet Prep HPF POC     KOH  Prep POC Positive (A) Negative     Assessment/Plan: Bacterial vaginosis - Plan: POCT Wet Prep with KOH,  metroNIDAZOLE (FLAGYL) 500 MG tablet; pos sx and wet prep. Rx flagyl. No EtOH. Will RF if sx recur.    Meds ordered this encounter  Medications  . metroNIDAZOLE (FLAGYL) 500 MG tablet    Sig: Take 1 tablet (500 mg total) by mouth 2 (two) times daily for 7 days.    Dispense:  14 tablet    Refill:  0    Order Specific Question:   Supervising Provider    Answer:   Gae Dry [[id number]]      Return if symptoms worsen or fail to improve.  Zorana Brockwell B. Dontell Mian, PA-C 02/15/2020 11:50 AM

## 2020-05-11 ENCOUNTER — Telehealth: Payer: Managed Care, Other (non HMO) | Admitting: Unknown Physician Specialty

## 2020-05-30 ENCOUNTER — Other Ambulatory Visit: Payer: Self-pay | Admitting: Physical Medicine and Rehabilitation

## 2020-05-30 ENCOUNTER — Other Ambulatory Visit: Payer: Self-pay

## 2020-05-30 ENCOUNTER — Ambulatory Visit
Admission: RE | Admit: 2020-05-30 | Discharge: 2020-05-30 | Disposition: A | Discharge status: Home / Self Care | Payer: Managed Care, Other (non HMO) | Source: Ambulatory Visit | Attending: Physical Medicine and Rehabilitation | Admitting: Physical Medicine and Rehabilitation

## 2020-05-30 DIAGNOSIS — M5126 Other intervertebral disc displacement, lumbar region: Secondary | ICD-10-CM | POA: Diagnosis not present

## 2020-06-01 ENCOUNTER — Other Ambulatory Visit: Payer: Self-pay | Admitting: Neurosurgery

## 2020-06-05 ENCOUNTER — Other Ambulatory Visit: Payer: Self-pay

## 2020-06-05 ENCOUNTER — Encounter
Admission: RE | Admit: 2020-06-05 | Discharge: 2020-06-05 | Disposition: A | Discharge status: Home / Self Care | Payer: Managed Care, Other (non HMO) | Source: Ambulatory Visit | Attending: Neurosurgery | Admitting: Neurosurgery

## 2020-06-05 DIAGNOSIS — Z20822 Contact with and (suspected) exposure to covid-19: Secondary | ICD-10-CM | POA: Insufficient documentation

## 2020-06-05 DIAGNOSIS — Z01812 Encounter for preprocedural laboratory examination: Secondary | ICD-10-CM | POA: Diagnosis present

## 2020-06-05 LAB — CBC
HCT: 37.1 % (ref 36.0–46.0)
Hemoglobin: 13.4 g/dL (ref 12.0–15.0)
MCH: 32.1 pg (ref 26.0–34.0)
MCHC: 36.1 g/dL — ABNORMAL HIGH (ref 30.0–36.0)
MCV: 89 fL (ref 80.0–100.0)
Platelets: 226 10*3/uL (ref 150–400)
RBC: 4.17 MIL/uL (ref 3.87–5.11)
RDW: 12.8 % (ref 11.5–15.5)
WBC: 6 10*3/uL (ref 4.0–10.5)
nRBC: 0 % (ref 0.0–0.2)

## 2020-06-05 LAB — URINALYSIS, ROUTINE W REFLEX MICROSCOPIC
Bacteria, UA: NONE SEEN
Bilirubin Urine: NEGATIVE
Glucose, UA: NEGATIVE mg/dL
Ketones, ur: NEGATIVE mg/dL
Leukocytes,Ua: NEGATIVE
Nitrite: NEGATIVE
Protein, ur: NEGATIVE mg/dL
Specific Gravity, Urine: 1.006 (ref 1.005–1.030)
pH: 7 (ref 5.0–8.0)

## 2020-06-05 LAB — BASIC METABOLIC PANEL
Anion gap: 7 (ref 5–15)
BUN: 12 mg/dL (ref 6–20)
CO2: 26 mmol/L (ref 22–32)
Calcium: 9.2 mg/dL (ref 8.9–10.3)
Chloride: 103 mmol/L (ref 98–111)
Creatinine, Ser: 0.79 mg/dL (ref 0.44–1.00)
GFR calc Af Amer: >60 mL/min (ref >60)
GFR calc non Af Amer: >60 mL/min (ref >60)
Glucose, Bld: 99 mg/dL (ref 70–99)
Potassium: 3.8 mmol/L (ref 3.5–5.1)
Sodium: 136 mmol/L (ref 135–145)

## 2020-06-05 LAB — TYPE AND SCREEN
ABO/RH(D): A POS
Antibody Screen: NEGATIVE

## 2020-06-05 LAB — SURGICAL PCR SCREEN
MRSA, PCR: NEGATIVE
Staphylococcus aureus: NEGATIVE

## 2020-06-05 LAB — APTT: aPTT: 26 seconds (ref 24–36)

## 2020-06-05 LAB — SARS CORONAVIRUS 2 (TAT 6-24 HRS): SARS Coronavirus 2: NEGATIVE

## 2020-06-05 LAB — PROTIME-INR
INR: 0.9 (ref 0.8–1.2)
Prothrombin Time: 11.5 seconds (ref 11.4–15.2)

## 2020-06-05 NOTE — Patient Instructions (Signed)
Your procedure is scheduled on: June 07, 2020 Wednesday  Report to Day Surgery on the 2nd floor of the [facility]. To find out your arrival time, please call [phone] between 1PM - 3PM on: June 06, 2020 TUESDAY  REMEMBER: Instructions that are not followed completely may result in serious medical risk, up to and including death; or upon the discretion of your surgeon and anesthesiologist your surgery may need to be rescheduled.  Do not eat food after midnight the night before surgery.  No gum chewing, lozengers or hard candies.  You may however, drink CLEAR liquids up to 2 hours before you are scheduled to arrive for your surgery. Do not drink anything within 2 hours of your scheduled arrival time.  Clear liquids include: - water  - apple juice without pulp - gatorade (not RED) - black coffee or tea (Do NOT add milk or creamers to the coffee or tea) Do NOT drink anything that is not on this list.  Type 1 and Type 2 diabetics should only drink water.  TAKE THESE MEDICATIONS THE MORNING OF SURGERY WITH A SIP OF WATER: PAIN PILL IF NEEDED  One week prior to surgery: Stop Anti-inflammatories (NSAIDS) such as Advil, Aleve, Ibuprofen, Motrin, Naproxen, Naprosyn and ASPIRIN AND  Aspirin based products such as Excedrin, Goodys Powder, BC Powder. Stop ANY OVER THE COUNTER supplements until after surgery. (You may continue taking Tylenol, Vitamin D, Vitamin B, and multivitamin.)  No Alcohol for 24 hours before or after surgery.  No Smoking including e-cigarettes for 24 hours prior to surgery.  No chewable tobacco products for at least 6 hours prior to surgery.  No nicotine patches on the day of surgery.  Do not use any "recreational" drugs for at least a week prior to your surgery.  Please be advised that the combination of cocaine and anesthesia may have negative outcomes, up to and including death. If you test positive for cocaine, your surgery will be cancelled.  On the  morning of surgery brush your teeth with toothpaste and water, you may rinse your mouth with mouthwash if you wish. Do not swallow any toothpaste or mouthwash.  Do not wear jewelry, make-up, hairpins, clips or nail polish.  Do not wear lotions, powders, or perfumes OR DEODORANT    Do not shave 48 hours prior to surgery.   Contact lenses, hearing aids and dentures may not be worn into surgery.  Do not bring valuables to the hospital. [facility] is not responsible for any missing/lost belongings or valuables.   Use CHG Soap as directed on instruction sheet.  Notify your doctor if there is any change in your medical condition (cold, fever, infection).  Wear comfortable clothing (specific to your surgery type) to the hospital.  Plan for stool softeners for home use; pain medications have a tendency to cause constipation. You can also help prevent constipation by eating foods high in fiber such as fruits and vegetables and drinking plenty of fluids as your diet allows.  After surgery, you can help prevent lung complications by doing breathing exercises.  Take deep breaths and cough every 1-2 hours. Your doctor may order a device called an Incentive Spirometer to help you take deep breaths. When coughing or sneezing, hold a pillow firmly against your incision with both hands. This is called "splinting." Doing this helps protect your incision. It also decreases belly discomfort.  If you are being discharged the day of surgery, you will not be allowed to drive home. You  will need a responsible adult (18 years or older) to drive you home and stay with you that night.   Please call the Pre-admissions Testing Dept. at [phone] if you have any questions about these instructions.  Visitation Policy:  Patients undergoing a surgery or procedure may have one family member or support person with them as long as that person is not COVID-19 positive or experiencing its symptoms.  That person  may remain in the waiting area during the procedure.  Inpatient Visitation Update:   In an effort to ensure the safety of our team members and our patients, we are implementing a change to our visitation policy:  Effective Monday, Aug. 9, at 7 a.m., inpatients will be allowed one support person.  o The support person may change daily.  o The support person must pass our screening, gel in and out, and wear a mask at all times, including in the patient's room.  o Patients must also wear a mask when staff or their support person are in the room.  o Masking is required regardless of vaccination status.  Systemwide, no visitors 17 or younger.

## 2020-06-07 ENCOUNTER — Ambulatory Visit
Admission: RE | Admit: 2020-06-07 | Discharge: 2020-06-07 | Disposition: A | Discharge status: Home / Self Care | Payer: Managed Care, Other (non HMO) | Attending: Neurosurgery | Admitting: Neurosurgery

## 2020-06-07 ENCOUNTER — Ambulatory Visit: Payer: Managed Care, Other (non HMO) | Admitting: Certified Registered"

## 2020-06-07 ENCOUNTER — Ambulatory Visit: Payer: Managed Care, Other (non HMO)

## 2020-06-07 ENCOUNTER — Encounter: Payer: Self-pay | Admitting: Neurosurgery

## 2020-06-07 ENCOUNTER — Encounter
Admission: RE | Disposition: A | Discharge status: Home / Self Care | Payer: Self-pay | Source: Home / Self Care | Attending: Neurosurgery

## 2020-06-07 ENCOUNTER — Other Ambulatory Visit: Payer: Self-pay

## 2020-06-07 DIAGNOSIS — G834 Cauda equina syndrome: Secondary | ICD-10-CM | POA: Insufficient documentation

## 2020-06-07 DIAGNOSIS — M5416 Radiculopathy, lumbar region: Secondary | ICD-10-CM | POA: Diagnosis present

## 2020-06-07 DIAGNOSIS — M5116 Intervertebral disc disorders with radiculopathy, lumbar region: Secondary | ICD-10-CM | POA: Diagnosis not present

## 2020-06-07 DIAGNOSIS — Z419 Encounter for procedure for purposes other than remedying health state, unspecified: Secondary | ICD-10-CM

## 2020-06-07 DIAGNOSIS — M5117 Intervertebral disc disorders with radiculopathy, lumbosacral region: Secondary | ICD-10-CM | POA: Diagnosis not present

## 2020-06-07 LAB — POCT PREGNANCY, URINE: Preg Test, Ur: NEGATIVE

## 2020-06-07 SURGERY — LUMBAR LAMINECTOMY/DECOMPRESSION MICRODISCECTOMY
Anesthesia: General | Laterality: Left

## 2020-06-07 MED ORDER — KETOROLAC TROMETHAMINE 30 MG/ML IJ SOLN
INTRAMUSCULAR | Status: DC | PRN
  Administered 2020-06-07: 30 mg via INTRAVENOUS

## 2020-06-07 MED ORDER — ACETAMINOPHEN 10 MG/ML IV SOLN
INTRAVENOUS | Status: AC
  Filled 2020-06-07: qty 100

## 2020-06-07 MED ORDER — PROMETHAZINE HCL 25 MG/ML IJ SOLN: 6.2500 mg | INTRAMUSCULAR | Status: DC | PRN

## 2020-06-07 MED ORDER — BUPIVACAINE-EPINEPHRINE (PF) 0.5% -1:200000 IJ SOLN
INTRAMUSCULAR | Status: DC | PRN
  Administered 2020-06-07: 4 mL

## 2020-06-07 MED ORDER — FAMOTIDINE 20 MG PO TABS
ORAL_TABLET | ORAL | Status: AC
  Administered 2020-06-07: 20 mg via ORAL
  Filled 2020-06-07: qty 1

## 2020-06-07 MED ORDER — PROPOFOL 10 MG/ML IV BOLUS
INTRAVENOUS | Status: DC | PRN
  Administered 2020-06-07: 200 mg via INTRAVENOUS

## 2020-06-07 MED ORDER — SUCCINYLCHOLINE CHLORIDE 20 MG/ML IJ SOLN
INTRAMUSCULAR | Status: DC | PRN
  Administered 2020-06-07: 140 mg via INTRAVENOUS

## 2020-06-07 MED ORDER — BUPIVACAINE HCL 0.5 % IJ SOLN
INTRAMUSCULAR | Status: DC | PRN
  Administered 2020-06-07: 20 mL

## 2020-06-07 MED ORDER — METHOCARBAMOL 500 MG PO TABS: 500.0000 mg | ORAL_TABLET | Freq: Four times a day (QID) | ORAL | 0 refills | Status: DC

## 2020-06-07 MED ORDER — SCOPOLAMINE 1 MG/3DAYS TD PT72
1.0000 | MEDICATED_PATCH | Freq: Once | TRANSDERMAL | Status: DC
  Administered 2020-06-07: 1.5 mg via TRANSDERMAL

## 2020-06-07 MED ORDER — ORAL CARE MOUTH RINSE: 15.0000 mL | Freq: Once | OROMUCOSAL | Status: AC

## 2020-06-07 MED ORDER — FENTANYL CITRATE (PF) 100 MCG/2ML IJ SOLN
INTRAMUSCULAR | Status: AC
  Administered 2020-06-07: 50 ug via INTRAVENOUS
  Filled 2020-06-07: qty 2

## 2020-06-07 MED ORDER — OXYCODONE HCL 5 MG PO TABS: 5.0000 mg | ORAL_TABLET | ORAL | 0 refills | Status: AC | PRN

## 2020-06-07 MED ORDER — CEFAZOLIN SODIUM-DEXTROSE 2-4 GM/100ML-% IV SOLN
INTRAVENOUS | Status: AC
  Filled 2020-06-07: qty 100

## 2020-06-07 MED ORDER — THROMBIN 5000 UNITS EX SOLR
CUTANEOUS | Status: DC | PRN
  Administered 2020-06-07: 5000 [IU] via TOPICAL

## 2020-06-07 MED ORDER — FENTANYL CITRATE (PF) 250 MCG/5ML IJ SOLN
INTRAMUSCULAR | Status: AC
  Filled 2020-06-07: qty 5

## 2020-06-07 MED ORDER — FENTANYL CITRATE (PF) 100 MCG/2ML IJ SOLN
25.0000 ug | INTRAMUSCULAR | Status: DC | PRN
  Administered 2020-06-07 (×2): 50 ug via INTRAVENOUS

## 2020-06-07 MED ORDER — FAMOTIDINE 20 MG PO TABS: 20.0000 mg | ORAL_TABLET | Freq: Once | ORAL | Status: AC

## 2020-06-07 MED ORDER — LACTATED RINGERS IV SOLN: INTRAVENOUS | Status: DC

## 2020-06-07 MED ORDER — EPHEDRINE 5 MG/ML INJ
INTRAVENOUS | Status: AC
  Filled 2020-06-07: qty 10

## 2020-06-07 MED ORDER — LIDOCAINE HCL (CARDIAC) PF 100 MG/5ML IV SOSY
PREFILLED_SYRINGE | INTRAVENOUS | Status: DC | PRN
  Administered 2020-06-07: 40 mg via INTRAVENOUS

## 2020-06-07 MED ORDER — MIDAZOLAM HCL 2 MG/2ML IJ SOLN
INTRAMUSCULAR | Status: DC | PRN
  Administered 2020-06-07: 2 mg via INTRAVENOUS

## 2020-06-07 MED ORDER — PROPOFOL 10 MG/ML IV BOLUS
INTRAVENOUS | Status: AC
  Filled 2020-06-07: qty 40

## 2020-06-07 MED ORDER — GLYCOPYRROLATE 0.2 MG/ML IJ SOLN
INTRAMUSCULAR | Status: DC | PRN
  Administered 2020-06-07: .2 mg via INTRAVENOUS

## 2020-06-07 MED ORDER — MIDAZOLAM HCL 2 MG/2ML IJ SOLN
INTRAMUSCULAR | Status: AC
  Filled 2020-06-07: qty 2

## 2020-06-07 MED ORDER — EPHEDRINE SULFATE 50 MG/ML IJ SOLN
INTRAMUSCULAR | Status: DC | PRN
  Administered 2020-06-07 (×2): 10 mg via INTRAVENOUS

## 2020-06-07 MED ORDER — ACETAMINOPHEN 10 MG/ML IV SOLN
INTRAVENOUS | Status: DC | PRN
  Administered 2020-06-07: 1000 mg via INTRAVENOUS

## 2020-06-07 MED ORDER — CHLORHEXIDINE GLUCONATE 0.12 % MT SOLN: 15.0000 mL | Freq: Once | OROMUCOSAL | Status: AC

## 2020-06-07 MED ORDER — OXYCODONE HCL 5 MG PO TABS
ORAL_TABLET | ORAL | Status: AC
  Filled 2020-06-07: qty 1

## 2020-06-07 MED ORDER — DEXAMETHASONE SODIUM PHOSPHATE 10 MG/ML IJ SOLN
INTRAMUSCULAR | Status: DC | PRN
  Administered 2020-06-07 (×2): 10 mg via INTRAVENOUS

## 2020-06-07 MED ORDER — CEFAZOLIN SODIUM-DEXTROSE 2-4 GM/100ML-% IV SOLN
2.0000 g | INTRAVENOUS | Status: AC
  Administered 2020-06-07: 2 g via INTRAVENOUS

## 2020-06-07 MED ORDER — DEXMEDETOMIDINE (PRECEDEX) IN NS 20 MCG/5ML (4 MCG/ML) IV SYRINGE
PREFILLED_SYRINGE | INTRAVENOUS | Status: DC | PRN
  Administered 2020-06-07: 20 ug via INTRAVENOUS

## 2020-06-07 MED ORDER — SCOPOLAMINE 1 MG/3DAYS TD PT72
MEDICATED_PATCH | TRANSDERMAL | Status: AC
  Filled 2020-06-07: qty 1

## 2020-06-07 MED ORDER — SODIUM CHLORIDE 0.9 % IV SOLN
INTRAVENOUS | Status: DC | PRN
  Administered 2020-06-07: 40 mL

## 2020-06-07 MED ORDER — FENTANYL CITRATE (PF) 100 MCG/2ML IJ SOLN
INTRAMUSCULAR | Status: AC
  Filled 2020-06-07: qty 2

## 2020-06-07 MED ORDER — CHLORHEXIDINE GLUCONATE 0.12 % MT SOLN
OROMUCOSAL | Status: AC
  Administered 2020-06-07: 15 mL via OROMUCOSAL
  Filled 2020-06-07: qty 15

## 2020-06-07 MED ORDER — FENTANYL CITRATE (PF) 100 MCG/2ML IJ SOLN
INTRAMUSCULAR | Status: DC | PRN
  Administered 2020-06-07 (×3): 50 ug via INTRAVENOUS
  Administered 2020-06-07: 100 ug via INTRAVENOUS

## 2020-06-07 MED ORDER — ONDANSETRON HCL 4 MG/2ML IJ SOLN
INTRAMUSCULAR | Status: DC | PRN
  Administered 2020-06-07 (×2): 4 mg via INTRAVENOUS

## 2020-06-07 MED ORDER — METHYLPREDNISOLONE ACETATE 40 MG/ML IJ SUSP
INTRAMUSCULAR | Status: DC | PRN
  Administered 2020-06-07: 40 mg

## 2020-06-07 MED ORDER — OXYCODONE HCL 5 MG PO TABS
5.0000 mg | ORAL_TABLET | Freq: Once | ORAL | Status: AC
Start: 2020-06-07 — End: 2020-06-07
  Administered 2020-06-07: 5 mg via ORAL

## 2020-06-07 SURGICAL SUPPLY — 59 items
ADH SKN CLS APL DERMABOND .7 (GAUZE/BANDAGES/DRESSINGS) ×1
AGENT HMST MTR 8 SURGIFLO (HEMOSTASIS) ×1
APL PRP STRL LF DISP 70% ISPRP (MISCELLANEOUS) ×2
BUR NEURO DRILL SOFT 3.0X3.8M (BURR) ×3 IMPLANT
CANISTER SUCT 1200ML W/VALVE (MISCELLANEOUS) ×6 IMPLANT
CHLORAPREP W/TINT 26 (MISCELLANEOUS) ×6 IMPLANT
CNTNR SPEC 2.5X3XGRAD LEK (MISCELLANEOUS) ×1
CONT SPEC 4OZ STER OR WHT (MISCELLANEOUS) ×2
CONT SPEC 4OZ STRL OR WHT (MISCELLANEOUS) ×1
CONTAINER SPEC 2.5X3XGRAD LEK (MISCELLANEOUS) ×1 IMPLANT
COUNTER NEEDLE 20/40 LG (NEEDLE) ×3 IMPLANT
COVER WAND RF STERILE (DRAPES) ×3 IMPLANT
CUP MEDICINE 2OZ PLAST GRAD ST (MISCELLANEOUS) ×6 IMPLANT
DERMABOND ADVANCED (GAUZE/BANDAGES/DRESSINGS) ×2
DERMABOND ADVANCED .7 DNX12 (GAUZE/BANDAGES/DRESSINGS) ×1 IMPLANT
DRAPE C-ARM 42X72 X-RAY (DRAPES) ×6 IMPLANT
DRAPE LAPAROTOMY 100X77 ABD (DRAPES) ×3 IMPLANT
DRAPE MICROSCOPE SPINE 48X150 (DRAPES) ×3 IMPLANT
DRAPE SURG 17X11 SM STRL (DRAPES) ×12 IMPLANT
ELECT CAUTERY BLADE TIP 2.5 (TIP) ×3
ELECT EZSTD 165MM 6.5IN (MISCELLANEOUS)
ELECT REM PT RETURN 9FT ADLT (ELECTROSURGICAL) ×3
ELECTRODE CAUTERY BLDE TIP 2.5 (TIP) ×1 IMPLANT
ELECTRODE EZSTD 165MM 6.5IN (MISCELLANEOUS) IMPLANT
ELECTRODE REM PT RTRN 9FT ADLT (ELECTROSURGICAL) ×1 IMPLANT
GLOVE BIOGEL PI IND STRL 7.0 (GLOVE) ×1 IMPLANT
GLOVE BIOGEL PI INDICATOR 7.0 (GLOVE) ×2
GLOVE SURG SYN 7.0 (GLOVE) ×6 IMPLANT
GLOVE SURG SYN 7.0 PF PI (GLOVE) ×2 IMPLANT
GLOVE SURG SYN 8.5  E (GLOVE) ×9
GLOVE SURG SYN 8.5 E (GLOVE) ×3 IMPLANT
GLOVE SURG SYN 8.5 PF PI (GLOVE) ×3 IMPLANT
GOWN SRG XL LVL 3 NONREINFORCE (GOWNS) ×1 IMPLANT
GOWN STRL NON-REIN TWL XL LVL3 (GOWNS) ×3
GOWN STRL REUS W/ TWL XL LVL3 (GOWN DISPOSABLE) ×1 IMPLANT
GOWN STRL REUS W/TWL XL LVL3 (GOWN DISPOSABLE) ×3
GRADUATE 1200CC STRL 31836 (MISCELLANEOUS) ×3 IMPLANT
KIT SPINAL PRONEVIEW (KITS) ×3 IMPLANT
KNIFE BAYONET SHORT DISCETOMY (MISCELLANEOUS) IMPLANT
MARKER SKIN DUAL TIP RULER LAB (MISCELLANEOUS) ×3 IMPLANT
NDL SAFETY ECLIPSE 18X1.5 (NEEDLE) ×1 IMPLANT
NEEDLE HYPO 18GX1.5 SHARP (NEEDLE) ×3
NEEDLE HYPO 22GX1.5 SAFETY (NEEDLE) ×3 IMPLANT
NS IRRIG 1000ML POUR BTL (IV SOLUTION) ×3 IMPLANT
PACK LAMINECTOMY NEURO (CUSTOM PROCEDURE TRAY) ×3 IMPLANT
PAD ARMBOARD 7.5X6 YLW CONV (MISCELLANEOUS) ×3 IMPLANT
SPOGE SURGIFLO 8M (HEMOSTASIS) ×3
SPONGE SURGIFLO 8M (HEMOSTASIS) ×1 IMPLANT
SUT DVC VLOC 3-0 CL 6 P-12 (SUTURE) ×3 IMPLANT
SUT VIC AB 0 CT1 27 (SUTURE) ×3
SUT VIC AB 0 CT1 27XCR 8 STRN (SUTURE) ×1 IMPLANT
SUT VIC AB 2-0 CT1 18 (SUTURE) ×3 IMPLANT
SYR 10ML LL (SYRINGE) ×3 IMPLANT
SYR 20ML LL LF (SYRINGE) ×3 IMPLANT
SYR 30ML LL (SYRINGE) ×6 IMPLANT
SYR 3ML LL SCALE MARK (SYRINGE) ×3 IMPLANT
TOWEL OR 17X26 4PK STRL BLUE (TOWEL DISPOSABLE) ×9 IMPLANT
TUBING CONNECTING 10 (TUBING) ×2 IMPLANT
TUBING CONNECTING 10' (TUBING) ×1

## 2020-06-07 NOTE — Anesthesia Procedure Notes (Signed)
Procedure Name: Intubation Performed by: Zyanna Leisinger L, CRNA Pre-anesthesia Checklist: Patient identified, Patient being monitored, Timeout performed, Emergency Drugs available and Suction available Patient Re-evaluated:Patient Re-evaluated prior to induction Oxygen Delivery Method: Circle system utilized Preoxygenation: Pre-oxygenation with 100% oxygen Induction Type: IV induction Ventilation: Mask ventilation without difficulty Laryngoscope Size: 3 and McGraph Grade View: Grade I Tube type: Oral Tube size: 7.0 mm Number of attempts: 1 Airway Equipment and Method: Stylet Placement Confirmation: ETT inserted through vocal cords under direct vision,  positive ETCO2 and breath sounds checked- equal and bilateral Secured at: 21 cm Tube secured with: Tape Dental Injury: Teeth and Oropharynx as per pre-operative assessment        

## 2020-06-07 NOTE — Op Note (Signed)
Indications: [name] is a 34 yo female who presented with lumbar radiculopathy and some symptoms of cauda equina syndrome.  Due to weakness and cauda equina syndrome, she elected for surgery  Findings: large disc herniation L5-S1  Preoperative Diagnosis: Lumbar radiculopathy, cauda equina syndroma Postoperative Diagnosis: same   EBL: 20 ml IVF: 500 ml Drains: none Disposition: Extubated and Stable to PACU Complications: none  No foley catheter was placed.   Preoperative Note:   Risks of surgery discussed include: infection, bleeding, stroke, coma, death, paralysis, CSF leak, nerve/spinal cord injury, numbness, tingling, weakness, complex regional pain syndrome, recurrent stenosis and/or disc herniation, vascular injury, development of instability, neck/back pain, need for further surgery, persistent symptoms, development of deformity, and the risks of anesthesia. The patient understood these risks and agreed to proceed.  Operative Note:   1) left L5/S1 microdiscectomy  The patient was then brought from the preoperative center with intravenous access established.  The patient underwent general anesthesia and endotracheal tube intubation, and was then rotated on the [place] rail top where all pressure points were appropriately padded.  The skin was then thoroughly cleansed.  Perioperative antibiotic prophylaxis was administered.  Sterile prep and drapes were then applied and a timeout was then observed.  C-arm was brought into the field under sterile conditions, and the L5-S1 disc space identified and marked with an incision on the left 1cm lateral to midline.  Once this was complete a 2 cm incision was opened with the use of a #10 blade knife.  The Metrx tubes were sequentially advanced under lateral fluoroscopy until a 18 x 70 mm Metrx tube was placed over the facet and lamina and secured to the bed.    The microscope was then sterilely brought into the field and muscle creep was  hemostased with a bipolar and resected with a pituitary rongeur.  A Bovie extender was then used to expose the spinous process and lamina.  Careful attention was placed to not violate the facet capsule. A 3 mm matchstick drill bit was then used to make a hemi-laminotomy trough until the ligamentum flavum was exposed.  This was extended to the base of the spinous process.  Once this was complete and the underlying ligamentum flavum was visualized this was dissected with an up angle curette and resected with a #2 and #3 mm biting Kerrison.  The laminotomy opening was also expanded in similar fashion and hemostasis was obtained with Surgifoam and a patty as well as bone wax.  The rostral aspect of the caudal level of the lamina was also resected with a #2 biting Kerrison effort to further enhance exposure.  Once the underlying dura was visualized a Penfield 4 was then used to dissect and expose the traversing nerve root.  The nerve root was lateral to the disc herniation. Once this was identified a nerve root retractor suction was used to mobilize this medially.  The venous plexus was hemostased with Surgifoam and light bipolar use.  A small Penfield was then used to make a small annulotomy within the disc space and disc space contents were noted to come through the annulus.    The disc herniation was identified and dissected free using a balltip probe. The pituitary rongeur was used to remove the extruded disc fragments. Once the thecal sac and nerve root were noted to be relaxed and under less tension the ball-tipped feeler was passed along the foramen distally to to ensure no residual compression was noted.    Depo-Medrol was  placed along the nerve root.  The area was irrigated. The tube system was then removed under microscopic visualization and hemostasis was obtained with a bipolar.    The fascial layer was reapproximated with the use of a 0- Vicryl suture.  Subcutaneous tissue layer was reapproximated  using 2-0 Vicryl suture.  3-0 monocryl was used on the skin. The skin was then cleansed and Dermabond was used to close the skin opening.  Patient was then rotated back to the preoperative bed awakened from anesthesia and taken to recovery all counts are correct in this case.   I performed the entire procedure with the assistance of Patsey Berthold NP as an Designer, television/film set.  Venetia Night MD

## 2020-06-07 NOTE — Transfer of Care (Signed)
Immediate Anesthesia Transfer of Care Note  Patient: [name]  Procedure(s) Performed: LEFT L5-S1 MICRODISCECTOMY (Left )  Patient Location: PACU  Anesthesia Type:General  Level of Consciousness: drowsy and patient cooperative  Airway & Oxygen Therapy: Patient Spontanous Breathing and Patient connected to face mask oxygen  Post-op Assessment: Report given to RN and Post -op Vital signs reviewed and stable  Post vital signs: Reviewed and stable  Last Vitals:  Vitals Value Taken Time  BP 103/64 06/07/20 1742  Temp 36.2 C 06/07/20 1731  Pulse 109 06/07/20 1742  Resp 17 06/07/20 1742  SpO2 100 % 06/07/20 1742    Last Pain:  Vitals:   06/07/20 1731  TempSrc:   PainSc: 0-No pain         Complications: No complications documented.

## 2020-06-07 NOTE — Anesthesia Preprocedure Evaluation (Addendum)
Anesthesia Evaluation  Patient identified by MRN, date of birth, ID band Patient awake    Reviewed: Allergy & Precautions, H&P , NPO status , Patient's Chart, lab work & pertinent test results  History of Anesthesia Complications (+) PONV and history of anesthetic complications  Airway Mallampati: I  TM Distance: >3 FB Neck ROM: full    Dental  (+) Dental Advidsory Given, Teeth Intact   Pulmonary neg pulmonary ROS, neg sleep apnea, neg COPD,    Pulmonary exam normal        Cardiovascular (-) hypertension(-) angina(-) Past MI and (-) Cardiac Stents negative cardio ROS Normal cardiovascular exam(-) dysrhythmias (-) Valvular Problems/Murmurs     Neuro/Psych  Headaches, Seizures - (as a child),  Anxiety negative psych ROS   GI/Hepatic negative GI ROS, Neg liver ROS,   Endo/Other  negative endocrine ROS  Renal/GU      Musculoskeletal   Abdominal   Peds  Hematology negative hematology ROS (+)   Anesthesia Other Findings Past Medical History: No date: Abnormal Pap smear No date: Anxiety No date: Complication of anesthesia     Comment:  N/V, passing out with epidural No date: Gestational diabetes No date: Headache(784.0) No date: Ovarian cyst No date: Preterm labor No date: Seizures (HCC) No date: Urinary tract infection  Past Surgical History: No date: repair of fr left foot No date: THERAPEUTIC ABORTION 04/21/2011: TUBAL LIGATION     Comment:  Procedure: POST PARTUM TUBAL LIGATION;  Surgeon: Tereso Newcomer, MD;  Location: [facility];  Service: Gynecology;                Laterality: Bilateral;  Bilateral post partum tubal               ligation with filshie clips. 2014: tubalectomy; Bilateral     Reproductive/Obstetrics negative OB ROS                            Anesthesia Physical Anesthesia Plan  ASA: II  Anesthesia Plan: General   Post-op Pain Management:     Induction: Intravenous  PONV Risk Score and Plan: Ondansetron, Dexamethasone, Midazolam, Treatment may vary due to age or medical condition, Scopolamine patch - Pre-op and Propofol infusion  Airway Management Planned: Oral ETT  Additional Equipment:   Intra-op Plan:   Post-operative Plan: Extubation in OR  Informed Consent: I have reviewed the patients History and Physical, chart, labs and discussed the procedure including the risks, benefits and alternatives for the proposed anesthesia with the patient or authorized representative who has indicated his/her understanding and acceptance.     Dental Advisory Given  Plan Discussed with: Anesthesiologist, CRNA and Surgeon  Anesthesia Plan Comments:        Anesthesia Quick Evaluation

## 2020-06-07 NOTE — Progress Notes (Signed)
Pharmacy Antibiotic Note  [name] is a 34 y.o. female admitted on (Not on file) with surgical prophylaxis.  Pharmacy has been consulted for cefazolin dosing.  Plan: Cefazolin 2 gm IV X 1 ordered to be given 60 min pre op     No data recorded.  Recent Labs  Lab 06/05/20 1013  WBC 6.0  CREATININE 0.79    Estimated Creatinine Clearance: 122.5 mL/min (by C-G formula based on SCr of 0.79 mg/dL).    Allergies  Allergen Reactions  . Contrave [Naltrexone-Bupropion Hcl Er] Nausea Only    irritability    Antimicrobials this admission:   >>    >>   Dose adjustments this admission:   Microbiology results:  BCx:   UCx:    Sputum:    MRSA PCR:   Thank you for allowing pharmacy to be a part of this patient's care.  Jilian West D 06/07/2020 2:44 AM

## 2020-06-07 NOTE — Anesthesia Postprocedure Evaluation (Signed)
Anesthesia Post Note  Patient: [name]  Procedure(s) Performed: LEFT L5-S1 MICRODISCECTOMY (Left )  Patient location during evaluation: PACU Anesthesia Type: General Level of consciousness: awake and alert Pain management: pain level controlled Vital Signs Assessment: post-procedure vital signs reviewed and stable Respiratory status: spontaneous breathing, nonlabored ventilation, respiratory function stable and patient connected to nasal cannula oxygen Cardiovascular status: blood pressure returned to baseline and stable Postop Assessment: no apparent nausea or vomiting Anesthetic complications: no   No complications documented.   Last Vitals:  Vitals:   06/07/20 1859 06/07/20 1905  BP:  119/65  Pulse: 84 81  Resp:  18  Temp: 36.6 C (!) 36.2 C  SpO2: 98% 98%    Last Pain:  Vitals:   06/07/20 1905  TempSrc: Temporal  PainSc: 4                  Lenard Simmer

## 2020-06-07 NOTE — H&P (Signed)
I have reviewed and confirmed my history and physical from 06/01/20 with no additions or changes. Plan for Left L5-S1 microdiscectomy.  Risks and benefits reviewed.  Heart sounds normal no MRG. Chest Clear to Auscultation Bilaterally.

## 2020-06-07 NOTE — Discharge Instructions (Signed)
AMBULATORY SURGERY  DISCHARGE INSTRUCTIONS   1) The drugs that you were given will stay in your system until tomorrow so for the next 24 hours you should not:  A) Drive an automobile B) Make any legal decisions C) Drink any alcoholic beverage   2) You may resume regular meals tomorrow.  Today it is better to start with liquids and gradually work up to solid foods.  You may eat anything you prefer, but it is better to start with liquids, then soup and crackers, and gradually work up to solid foods.   3) Please notify your doctor immediately if you have any unusual bleeding, trouble breathing, redness and pain at the surgery site, drainage, fever, or pain not relieved by medication.    4) Additional Instructions:        Please contact your physician with any problems or Same Day Surgery at [phone], Monday through Friday 6 am to 4 pm, or [facility] Main number at [phone]. Your surgeon has performed an operation on your lumbar spine (low back) to relieve pressure on one or more nerves. Many times, patients feel better immediately after surgery and can "overdo it." Even if you feel well, it is important that you follow these activity guidelines. If you do not let your back heal properly from the surgery, you can increase the chance of a disc herniation and/or return of your symptoms. The following are instructions to help in your recovery once you have been discharged from the hospital.  * Do not take anti-inflammatory medications for 3 days after surgery (naproxen [Aleve], ibuprofen [Advil, Motrin], celecoxib [Celebrex], etc.)  Activity    No bending, lifting, or twisting ("BLT"). Avoid lifting objects heavier than 10 pounds (gallon milk jug).  Where possible, avoid household activities that involve lifting, bending, pushing, or pulling such as laundry, vacuuming, grocery shopping, and childcare. Try to arrange for help from friends and family for these  activities while your back heals.  Increase physical activity slowly as tolerated.  Taking short walks is encouraged, but avoid strenuous exercise. Do not jog, run, bicycle, lift weights, or participate in any other exercises unless specifically allowed by your doctor. Avoid prolonged sitting, including car rides.  Talk to your doctor before resuming sexual activity.  You should not drive until cleared by your doctor.  Until released by your doctor, you should not return to work or school.  You should rest at home and let your body heal.   You may shower two days after your surgery.  After showering, lightly dab your incision dry. Do not take a tub bath or go swimming for 3 weeks, or until approved by your doctor at your follow-up appointment.  If you smoke, we strongly recommend that you quit.  Smoking has been proven to interfere with normal healing in your back and will dramatically reduce the success rate of your surgery. Please contact [facility] (800-QUIT-NOW) and use the resources at www.[facility].com for assistance in stopping smoking.  Surgical Incision   If you have a dressing on your incision, you may remove it three days after your surgery. Keep your incision area clean and dry.  If you have staples or stitches on your incision, you should have a follow up scheduled for removal. If you do not have staples or stitches, you will have steri-strips (small pieces of surgical tape) or Dermabond glue. The steri-strips/glue should begin to peel away within about a week (it is fine if the steri-strips fall off before   then). If the strips are still in place one week after your surgery, you may gently remove them.  Diet            You may return to your usual diet. Be sure to stay hydrated.  When to Contact Us  Although your surgery and recovery will likely be uneventful, you may have some residual numbness, aches, and pains in your back and/or legs. This is normal and should improve in  the next few weeks.  However, should you experience any of the following, contact us immediately: . New numbness or weakness . Pain that is progressively getting worse, and is not relieved by your pain medications or rest . Bleeding, redness, swelling, pain, or drainage from surgical incision . Chills or flu-like symptoms . Fever greater than 101.0 F (38.3 C) . Problems with bowel or bladder functions . Difficulty breathing or shortness of breath . Warmth, tenderness, or swelling in your calf  Contact Information . During office hours (Monday-Friday 9 am to 5 pm), please call your physician at [phone] . After hours and weekends, please call [phone] and an answering service will put you in touch with either Dr. Cook or Dr. Yarbrough.  . For a life-threatening emergency, call 911  

## 2020-06-08 ENCOUNTER — Encounter: Payer: Self-pay | Admitting: Neurosurgery

## 2020-06-08 NOTE — Discharge Summary (Signed)
Procedure: L5-S1 microdiscectomy Procedure date: 06/07/2020 Diagnosis: lumbar radiculopathy    History: [name] is s/p L5-S1 microdiscectomy POD0: Tolerated procedure well. Evaluated in post op recovery still disoriented from anesthesia but able to answer questions and obey commands.   Physical Exam: Vitals:   06/07/20 1859 06/07/20 1905  BP:  119/65  Pulse: 84 81  Resp:  18  Temp: 97.9 F (36.6 C) (!) 97.2 F (36.2 C)  SpO2: 98% 98%    General: Alert and oriented, lying in bed Strength:5/5 throughout  Sensation: intact and symmetric throughout  Skin: incision c/d/i  Data:  Recent Labs  Lab 06/05/20 1013  NA 136  K 3.8  CL 103  CO2 26  BUN 12  CREATININE 0.79  GLUCOSE 99  CALCIUM 9.2   No results for input(s): AST, ALT, ALKPHOS in the last 168 hours.  Invalid input(s): TBILI   Recent Labs  Lab 06/05/20 1013  WBC 6.0  HGB 13.4  HCT 37.1  PLT 226   Recent Labs  Lab 06/05/20 1013  APTT 26  INR 0.9         Assessment/Plan:  [name] is POD0 s/p L5-S1 microdiscectomy.   Once she is able to ambulate, tolerate PO, and urinate, she is able to be discharged to home.   Patsey Berthold, NP Department of Neurosurgery

## 2020-10-13 ENCOUNTER — Other Ambulatory Visit: Payer: Self-pay

## 2020-10-13 ENCOUNTER — Encounter: Payer: Self-pay | Admitting: Family Medicine

## 2020-10-13 ENCOUNTER — Ambulatory Visit (INDEPENDENT_AMBULATORY_CARE_PROVIDER_SITE_OTHER): Payer: Managed Care, Other (non HMO) | Admitting: Family Medicine

## 2020-10-13 VITALS — BP 116/78 | HR 90 | Temp 98.5°F | Wt 212.2 lb

## 2020-10-13 DIAGNOSIS — Z6830 Body mass index (BMI) 30.0-30.9, adult: Secondary | ICD-10-CM

## 2020-10-13 DIAGNOSIS — E6609 Other obesity due to excess calories: Secondary | ICD-10-CM | POA: Diagnosis not present

## 2020-10-13 DIAGNOSIS — N644 Mastodynia: Secondary | ICD-10-CM

## 2020-10-13 MED ORDER — "PEN NEEDLES 3/16"" 31G X 5 MM MISC": 1.0000 | Freq: Every day | 12 refills | Status: DC

## 2020-10-13 MED ORDER — SAXENDA 18 MG/3ML ~~LOC~~ SOPN: PEN_INJECTOR | SUBCUTANEOUS | 3 refills | Status: DC

## 2020-10-13 NOTE — Progress Notes (Signed)
BP 116/78   Pulse 90   Temp 98.5 F (36.9 C) (Oral)   Wt 212 lb 3.2 oz (96.3 kg)   SpO2 98%   BMI 31.34 kg/m    Subjective:    Patient ID: [name], female    DOB: [birth year], 35 y.o.   MRN: [id number]  HPI: [name] is a 35 y.o. female  Chief Complaint  Patient presents with  . Weight Check    Pt states she was taking medication to help with weight management and then she had back surgery in October 2021 and was told to stop taking prescribed medication that was helping with weight management. Pt would like to discuss next steps.  . Breast Problem    Pt states she would like to have her R breast looked at, as she noticed a lump under the breast and mother passed away from cancer and just wanted to be safe than sorry. Pt states she first noticed about 6 weeks ago. Pt denies having any drainage.    WEIGHT GAIN Duration: chronic Previous attempts at weight loss: yes Complications of obesity: none Peak weight: 224 Weight loss goal: 180  Weight loss to date: 12lbs Requesting obesity pharmacotherapy: yes Current weight loss supplements/medications: no Previous weight loss supplements/meds: yes  BREAST PAIN- Mom passed away from breast cancer at 45 Duration :a couple of weeks Location: right Onset: sudden Severity: mild Quality: aching and sore Frequency: constant Redness: no Swelling: no Trauma: no trauma Breastfeeding: no Associated with menstral cycle: no Nipple discharge: no Breast lump: no Status: stable Treatments attempted: none Previous mammogram: no  Relevant past medical, surgical, family and social history reviewed and updated as indicated. Interim medical history since our last visit reviewed. Allergies and medications reviewed and updated.  Review of Systems  Constitutional: Negative.   Respiratory: Negative.   Cardiovascular: Negative.   Gastrointestinal: Negative.   Musculoskeletal: Negative.   Neurological: Negative.    Psychiatric/Behavioral: Negative.     Per HPI unless specifically indicated above     Objective:    BP 116/78   Pulse 90   Temp 98.5 F (36.9 C) (Oral)   Wt 212 lb 3.2 oz (96.3 kg)   SpO2 98%   BMI 31.34 kg/m   Wt Readings from Last 3 Encounters:  10/13/20 212 lb 3.2 oz (96.3 kg)  06/07/20 215 lb (97.5 kg)  06/05/20 215 lb 11.2 oz (97.8 kg)    Physical Exam Vitals and nursing note reviewed.  Constitutional:      General: She is not in acute distress.    Appearance: Normal appearance. She is not ill-appearing, toxic-appearing or diaphoretic.  HENT:     Head: Normocephalic and atraumatic.     Right Ear: External ear normal.     Left Ear: External ear normal.     Nose: Nose normal.     Mouth/Throat:     Mouth: Mucous membranes are moist.     Pharynx: Oropharynx is clear.  Eyes:     General: No scleral icterus.       Right eye: No discharge.        Left eye: No discharge.     Extraocular Movements: Extraocular movements intact.     Conjunctiva/sclera: Conjunctivae normal.     Pupils: Pupils are equal, round, and reactive to light.  Cardiovascular:     Rate and Rhythm: Normal rate and regular rhythm.     Pulses: Normal pulses.     Heart sounds: Normal heart  sounds. No murmur heard. No friction rub. No gallop.   Pulmonary:     Effort: Pulmonary effort is normal. No respiratory distress.     Breath sounds: Normal breath sounds. No stridor. No wheezing, rhonchi or rales.  Chest:     Chest wall: No tenderness.  Breasts:     Right: Normal.     Left: Normal.     Musculoskeletal:        General: Normal range of motion.     Cervical back: Normal range of motion and neck supple.  Skin:    General: Skin is warm and dry.     Capillary Refill: Capillary refill takes less than 2 seconds.     Coloration: Skin is not jaundiced or pale.     Findings: No bruising, erythema, lesion or rash.  Neurological:     General: No focal deficit present.     Mental Status: She is  alert and oriented to person, place, and time. Mental status is at baseline.  Psychiatric:        Mood and Affect: Mood normal.        Behavior: Behavior normal.        Thought Content: Thought content normal.        Judgment: Judgment normal.     Results for orders placed or performed during the hospital encounter of 06/07/20  Pregnancy, urine POC  Result Value Ref Range   Preg Test, Ur NEGATIVE NEGATIVE      Assessment & Plan:   Problem List Items Addressed This Visit      Other   Obesity    Did well on her saxenda previously, but has been off of it since her surgery. Will restart it. Recheck 6-8 weeks. Call with any concerns.       Relevant Medications   Liraglutide -Weight Management (SAXENDA) 18 MG/3ML SOPN    Other Visit Diagnoses    Breast pain, right    -  Primary   Will get her set up for mammogram. Await results. Treat as needed.    Relevant Orders   MM Digital Diagnostic Bilat   US BREAST LTD UNI RIGHT INC AXILLA   US BREAST LTD UNI LEFT INC AXILLA       Follow up plan: Return 6-8 weeks follow up weight.

## 2020-10-13 NOTE — Patient Instructions (Signed)
Call to schedule your mammogram: [facility]  Address: [address], [place], [place] [address]  Phone: [phone]  

## 2020-10-15 ENCOUNTER — Encounter: Payer: Self-pay | Admitting: Family Medicine

## 2020-10-15 NOTE — Assessment & Plan Note (Signed)
Did well on her saxenda previously, but has been off of it since her surgery. Will restart it. Recheck 6-8 weeks. Call with any concerns.

## 2020-10-27 ENCOUNTER — Ambulatory Visit
Admission: RE | Admit: 2020-10-27 | Discharge: 2020-10-27 | Disposition: A | Discharge status: Home / Self Care | Payer: Managed Care, Other (non HMO) | Source: Ambulatory Visit | Attending: Family Medicine | Admitting: Family Medicine

## 2020-10-27 ENCOUNTER — Other Ambulatory Visit: Payer: Self-pay

## 2020-10-27 DIAGNOSIS — N644 Mastodynia: Secondary | ICD-10-CM

## 2020-12-01 ENCOUNTER — Ambulatory Visit: Payer: Managed Care, Other (non HMO) | Admitting: Family Medicine

## 2021-01-08 ENCOUNTER — Telehealth (INDEPENDENT_AMBULATORY_CARE_PROVIDER_SITE_OTHER): Payer: Managed Care, Other (non HMO) | Admitting: Family Medicine

## 2021-01-08 ENCOUNTER — Encounter: Payer: Self-pay | Admitting: Family Medicine

## 2021-01-08 ENCOUNTER — Ambulatory Visit: Payer: Self-pay

## 2021-01-08 VITALS — Temp 103.1°F

## 2021-01-08 DIAGNOSIS — U071 COVID-19: Secondary | ICD-10-CM | POA: Diagnosis not present

## 2021-01-08 MED ORDER — HYDROCOD POLST-CPM POLST ER 10-8 MG/5ML PO SUER
5.0000 mL | Freq: Two times a day (BID) | ORAL | 0 refills | Status: DC | PRN
Start: 2021-01-08 — End: 2021-10-23

## 2021-01-08 MED ORDER — BENZONATATE 200 MG PO CAPS: 200.0000 mg | ORAL_CAPSULE | Freq: Two times a day (BID) | ORAL | 0 refills | Status: DC | PRN

## 2021-01-08 MED ORDER — PREDNISONE 10 MG PO TABS: ORAL_TABLET | ORAL | 0 refills | Status: DC

## 2021-01-08 NOTE — Telephone Encounter (Signed)
Pt. States she was exposed at work to someone with COVID 19, Started feeling bad 2 days ago. Has cough, chills, fatigue, no taste, pain with deep breath. Will do a home test this morning. Has a virtual visit this morning.  Answer Assessment - Initial Assessment Questions 1. COVID-19 DIAGNOSIS: "Who made your COVID-19 diagnosis?" "Was it confirmed by a positive lab test or self-test?" If not diagnosed by a doctor (or NP/PA), ask "Are there lots of cases (community spread) where you live?" Note: See public health department website, if unsure.     No test yet 2. COVID-19 EXPOSURE: "Was there any known exposure to COVID before the symptoms began?" CDC Definition of close contact: within 6 feet (2 meters) for a total of 15 minutes or more over a 24-hour period.      Yes 3. ONSET: "When did the COVID-19 symptoms start?"      2 days ago 4. WORST SYMPTOM: "What is your worst symptom?" (e.g., cough, fever, shortness of breath, muscle aches)     Cough, chest tightness 5. COUGH: "Do you have a cough?" If Yes, ask: "How bad is the cough?"       Yes 6. FEVER: "Do you have a fever?" If Yes, ask: "What is your temperature, how was it measured, and when did it start?"     No, chills 7. RESPIRATORY STATUS: "Describe your breathing?" (e.g., shortness of breath, wheezing, unable to speak)      No 8. BETTER-SAME-WORSE: "Are you getting better, staying the same or getting worse compared to yesterday?"  If getting worse, ask, "In what way?"     Worse 9. HIGH RISK DISEASE: "Do you have any chronic medical problems?" (e.g., asthma, heart or lung disease, weak immune system, obesity, etc.)     No 10. VACCINE: "Have you had the COVID-19 vaccine?" If Yes, ask: "Which one, how many shots, when did you get it?"       Yes JJ 11. BOOSTER: "Have you received your COVID-19 booster?" If Yes, ask: "Which one and when did you get it?"       nO 12. PREGNANCY: "Is there any chance you are pregnant?" "When was your last  menstrual period?"       No 13. OTHER SYMPTOMS: "Do you have any other symptoms?"  (e.g., chills, fatigue, headache, loss of smell or taste, muscle pain, sore throat)       Chills,fatigue, sharp pain with deep breath 14. O2 SATURATION MONITOR:  "Do you use an oxygen saturation monitor (pulse oximeter) at home?" If Yes, ask "What is your reading (oxygen level) today?" "What is your usual oxygen saturation reading?" (e.g., 95%)       No  Protocols used: CORONAVIRUS (COVID-19) DIAGNOSED OR SUSPECTED-A-AH

## 2021-01-08 NOTE — Progress Notes (Signed)
Temp (!) 103.1 F (39.5 C) (Oral)    Subjective:    Patient ID: [name], female    DOB: 1985-12-13, 35 y.o.   MRN: [id number]  HPI: [name] is a 35 y.o. female  Chief Complaint  Patient presents with  . Covid Positive    Patient states she took at home covid test it was positive. Patient states she has been coughing, sore throat, chest hurts when she coughs. Patient can not smell or taste. Patient states she has been sick for a few days. Patient states she started feeling worse today and took her temperature today and it was 103.1    UPPER RESPIRATORY TRACT INFECTION- tested positive this AM Duration: 3 days Worst symptom: chills, sweats, fevers Fever: yes Cough: yes Shortness of breath: yes Wheezing: no Chest pain: yes, with cough Chest tightness: yes Chest congestion: no Nasal congestion: no Runny nose: yes Post nasal drip: yes Sneezing: yes Sore throat: yes Swollen glands: no Sinus pressure: yes Headache: no Face pain: no Toothache: no Ear pain: no  Ear pressure: no  Eyes red/itching:no Eye drainage/crusting: no  Vomiting: no Rash: no Fatigue: yes Sick contacts: yes Strep contacts: no  Context: worse Recurrent sinusitis: no Relief with OTC cold/cough medications: no  Treatments attempted: none   Relevant past medical, surgical, family and social history reviewed and updated as indicated. Interim medical history since our last visit reviewed. Allergies and medications reviewed and updated.  Review of Systems  Constitutional: Positive for chills, diaphoresis, fatigue and fever. Negative for activity change, appetite change and unexpected weight change.  HENT: Positive for congestion, postnasal drip, rhinorrhea, sinus pressure, sinus pain, sneezing and sore throat. Negative for dental problem, drooling, ear discharge, ear pain, facial swelling, hearing loss, mouth sores, nosebleeds, tinnitus, trouble swallowing and voice change.   Eyes:  Negative.   Respiratory: Positive for cough, chest tightness and shortness of breath. Negative for apnea, choking, wheezing and stridor.   Cardiovascular: Negative.   Gastrointestinal: Negative.   Genitourinary: Negative.   Musculoskeletal: Positive for arthralgias and myalgias. Negative for back pain, gait problem, joint swelling, neck pain and neck stiffness.  Skin: Negative.   Psychiatric/Behavioral: Negative.     Per HPI unless specifically indicated above     Objective:    Temp (!) 103.1 F (39.5 C) (Oral)   Wt Readings from Last 3 Encounters:  10/13/20 212 lb 3.2 oz (96.3 kg)  06/07/20 215 lb (97.5 kg)  06/05/20 215 lb 11.2 oz (97.8 kg)    Physical Exam Vitals and nursing note reviewed.  Constitutional:      General: She is not in acute distress.    Appearance: Normal appearance. She is ill-appearing. She is not toxic-appearing or diaphoretic.  HENT:     Head: Normocephalic and atraumatic.     Right Ear: External ear normal.     Left Ear: External ear normal.     Nose: Nose normal.     Mouth/Throat:     Mouth: Mucous membranes are moist.     Pharynx: Oropharynx is clear.  Eyes:     General: No scleral icterus.       Right eye: No discharge.        Left eye: No discharge.     Conjunctiva/sclera: Conjunctivae normal.     Pupils: Pupils are equal, round, and reactive to light.  Pulmonary:     Effort: Pulmonary effort is normal. No respiratory distress.     Comments: Speaking in full  sentences Musculoskeletal:        General: Normal range of motion.     Cervical back: Normal range of motion.  Skin:    Coloration: Skin is not jaundiced or pale.     Findings: No bruising, erythema, lesion or rash.  Neurological:     Mental Status: She is alert and oriented to person, place, and time. Mental status is at baseline.  Psychiatric:        Mood and Affect: Mood normal.        Behavior: Behavior normal.        Thought Content: Thought content normal.        Judgment:  Judgment normal.     Results for orders placed or performed during the hospital encounter of 06/07/20  Pregnancy, urine POC  Result Value Ref Range   Preg Test, Ur NEGATIVE NEGATIVE      Assessment & Plan:   Problem List Items Addressed This Visit   None   Visit Diagnoses    COVID-19    -  Primary   Will treat with prednisone, tussionex and tessalon. Referral for COVID treatment made today. Continue to monitor. Call if not getting better or getting worse.    Relevant Orders   Ambulatory referral for Covid Treatment       Follow up plan: Return if symptoms worsen or fail to improve.   . This visit was completed via video visit through MyChart due to the restrictions of the COVID-19 pandemic. All issues as above were discussed and addressed. Physical exam was done as above through visual confirmation on video through MyChart. If it was felt that the patient should be evaluated in the office, they were directed there. The patient verbally consented to this visit. . Location of the patient: home . Location of the provider: work . Those involved with this call:  . Provider: Olevia Perches, DO . CMA: Rolley Sims, CMA . Front Desk/Registration: Harriet Pho  . Time spent on call: 15 minutes with patient face to face via video conference. More than 50% of this time was spent in counseling and coordination of care. 23 minutes total spent in review of patient's record and preparation of their chart.

## 2021-01-10 ENCOUNTER — Telehealth: Payer: Self-pay | Admitting: Adult Health

## 2021-01-10 ENCOUNTER — Other Ambulatory Visit: Payer: Self-pay

## 2021-01-10 ENCOUNTER — Encounter: Payer: Self-pay | Admitting: Adult Health

## 2021-01-10 ENCOUNTER — Other Ambulatory Visit: Payer: Self-pay | Admitting: Adult Health

## 2021-01-10 DIAGNOSIS — U071 COVID-19: Secondary | ICD-10-CM

## 2021-01-10 MED ORDER — MOLNUPIRAVIR EUA 200MG CAPSULE
4.0000 | ORAL_CAPSULE | Freq: Two times a day (BID) | ORAL | 0 refills | Status: AC
  Filled 2021-01-10: qty 40, 5d supply, fill #0

## 2021-01-10 NOTE — Telephone Encounter (Signed)
Called to discuss with patient about COVID-19 symptoms and the use of one of the available treatments for those with mild to moderate Covid symptoms and at a high risk of hospitalization.  Pt appears to qualify for outpatient treatment due to co-morbid conditions and/or a member of an at-risk group in accordance with the FDA Emergency Use Authorization.      Unable to reach pt - LMOM   Rishard Delange C Faryn Sieg   

## 2021-01-10 NOTE — Progress Notes (Signed)
Outpatient Oral COVID Treatment Note  I connected with [name] on 01/10/2021/2:52 PM by telephone and verified that I am speaking with the correct person using two identifiers.  I discussed the limitations, risks, security, and privacy concerns of performing an evaluation and management service by telephone and the availability of in person appointments. I also discussed with the patient that there may be a patient responsible charge related to this service. The patient expressed understanding and agreed to proceed.  Patient location: home Provider location: home  Diagnosis: COVID-19 infection  Purpose of visit: Discussion of potential use of Molnupiravir or Paxlovid, a new treatment for mild to moderate COVID-19 viral infection in non-hospitalized patients.   Subjective: Patient is a 35 y.o. female who has been diagnosed with COVID 19 viral infection.  Their symptoms began on 01/07/2021 with cough/congestion.    Past Medical History:  Diagnosis Date  . Abnormal Pap smear   . Anxiety   . Complication of anesthesia    N/V, passing out with epidural  . Headache(784.0)   . Ovarian cyst   . Preterm labor   . Seizures (HCC)   . Urinary tract infection     Allergies  Allergen Reactions  . Contrave [Naltrexone-Bupropion Hcl Er] Nausea Only    irritability     Current Outpatient Medications:  .  benzonatate (TESSALON) 200 MG capsule, Take 1 capsule (200 mg total) by mouth 2 (two) times daily as needed for cough., Disp: 20 capsule, Rfl: 0 .  chlorpheniramine-HYDROcodone (TUSSIONEX PENNKINETIC ER) 10-8 MG/5ML SUER, Take 5 mLs by mouth every 12 (twelve) hours as needed., Disp: 50 mL, Rfl: 0 .  Insulin Pen Needle (PEN NEEDLES 3/16") 31G X 5 MM MISC, 1 each by Does not apply route daily., Disp: 100 each, Rfl: 12 .  Liraglutide -Weight Management (SAXENDA) 18 MG/3ML SOPN, Inject 0.6 mg into the skin daily for 7 days, THEN 1.2 mg daily for 7 days, THEN 1.8 mg daily for 7 days, THEN 2.4 mg  daily for 7 days, THEN 3 mg daily., Disp: 9 mL, Rfl: 3 .  methocarbamol (ROBAXIN) 500 MG tablet, Take 1 tablet (500 mg total) by mouth every 6 (six) hours. (Patient not taking: No sig reported), Disp: 80 tablet, Rfl: 0 .  predniSONE (DELTASONE) 10 MG tablet, 6 tabs today and tomorrow, 5 tabs the next 2 days, decrease by 1 every other day until gone., Disp: 42 tablet, Rfl: 0  Objective: Patient sounds hoarse and tired.  They are in no apparent distress.  Breathing is non labored.  Mood and behavior are normal.  Laboratory Data:  No results found for this or any previous visit (from the past 2160 hour(s)).   Assessment: 35 y.o. female with mild/moderate COVID 19 viral infection diagnosed on 01/08/2021 at high risk for progression to severe COVID 19.  Plan:  This patient is a 35 y.o. female that meets the following criteria for Emergency Use Authorization of: Molnupiravir  1. Age >18 yr 2. SARS-COV-2 positive test 3. Symptom onset < 5 days 4. Mild-to-moderate COVID disease with high risk for severe progression to hospitalization or death   I have spoken and communicated the following to the patient or parent/caregiver regarding: 1. Molnupiravir is an unapproved drug that is authorized for use under an TEFL teacher.  2. There are no adequate, approved, available products for the treatment of COVID-19 in adults who have mild-to-moderate COVID-19 and are at high risk for progressing to severe COVID-19, including hospitalization or death.  3. Other therapeutics are currently authorized. For additional information on all products authorized for treatment or prevention of COVID-19, please see https://www.graham-miller.[email or web address]/.  4. There are benefits and risks of taking this treatment as outlined in the "Fact Sheet for Patients and Caregivers."  5. "Fact Sheet for Patients and Caregivers" was reviewed  with patient. A hard copy will be provided to patient from pharmacy prior to the patient receiving treatment. 6. Patients should continue to self-isolate and use infection control measures (e.g., wear mask, isolate, social distance, avoid sharing personal items, clean and disinfect "high touch" surfaces, and frequent handwashing) according to CDC guidelines.  7. The patient or parent/caregiver has the option to accept or refuse treatment. 8. [employer] has established a pregnancy surveillance program. 9. Females of childbearing potential should use a reliable method of contraception correctly and consistently, as applicable, for the duration of treatment and for 4 days after the last dose of Molnupiravir. 10. Males of reproductive potential who are sexually active with females of childbearing potential should use a reliable method of contraception correctly and consistently during treatment and for at least 3 months after the last dose. 11. Pregnancy status and risk was assessed. Patient verbalized understanding of precautions.   After reviewing above information with the patient, the patient agrees to receive molnupiravir.  Follow up instructions:    . Take prescription BID x 5 days as directed . Reach out to pharmacist for counseling on medication if desired . For concerns regarding further COVID symptoms please follow up with your PCP or urgent care . For urgent or life-threatening issues, seek care at your local emergency department  The patient was provided an opportunity to ask questions, and all were answered. The patient agreed with the plan and demonstrated an understanding of the instructions.   Script sent to [facility] and opted to pick up RX.  The patient was advised to call their PCP or seek an in-person evaluation if the symptoms worsen or if the condition fails to improve as anticipated.   I provided 10 minutes of non face-to-face telephone visit time  during this encounter, and > 50% was spent counseling as documented under my assessment & plan.  Noreene Filbert, NP 01/10/2021 /2:52 PM

## 2021-01-11 ENCOUNTER — Encounter: Payer: Self-pay | Admitting: Family Medicine

## 2021-01-12 ENCOUNTER — Telehealth: Payer: Managed Care, Other (non HMO) | Admitting: Nurse Practitioner

## 2021-09-27 NOTE — Progress Notes (Deleted)
PCP:  Valerie Roys, DO   No chief complaint on file.    HPI:      [name] is a 36 y.o. [id number] whose LMP was No LMP recorded., presents today for her annual examination.  Her menses are regular every 28-30 days, lasting {number: 22536} days.  Dysmenorrhea {dysmen:716}. She {does:18564} have intermenstrual bleeding.  Sex activity: {sex active: 315163}.  Last Pap: 05/09/17 Results were: no abnormalities /neg HPV DNA  Hx of STDs: {STD hx:14358}  There is no FH of breast cancer. There is no FH of ovarian cancer. The patient {does:18564} do self-breast exams.  Tobacco use: {tob:20664} Alcohol use: {Alcohol:11675} No drug use.  Exercise: {exercise:31265}  She {does:18564} get adequate calcium and Vitamin D in her diet.  Patient Active Problem List   Diagnosis Date Noted   Obesity 07/04/2017   Acute anxiety 05/10/2016   Insomnia 05/10/2016   Heavy menstrual bleeding 03/29/2015   Low grade squamous intraepithelial lesion (LGSIL) on Papanicolaou smear of cervix 09/29/2013    Past Surgical History:  Procedure Laterality Date   LUMBAR LAMINECTOMY/DECOMPRESSION MICRODISCECTOMY Left 06/07/2020   Procedure: LEFT L5-S1 MICRODISCECTOMY;  Surgeon: Meade Maw, MD;  Location: [facility];  Service: Neurosurgery;  Laterality: Left;   repair of fr left foot     THERAPEUTIC ABORTION     TUBAL LIGATION  04/21/2011   Procedure: POST PARTUM TUBAL LIGATION;  Surgeon: Osborne Oman, MD;  Location: [place] [facility];  Service: Gynecology;  Laterality: Bilateral;  Bilateral post partum tubal ligation with filshie clips.   tubalectomy Bilateral 2014    Family History  Problem Relation Age of Onset   Hyperthyroidism Father    Stroke Father    Hypertension Father    Hypothyroidism Father    Hypothyroidism Mother    Cancer Mother        pancreatic   Cancer Paternal Grandfather        leukemia   Multiple sclerosis Paternal Aunt    Diabetes Maternal Grandmother    COPD Maternal  Grandfather    Stroke Paternal Grandmother    Alzheimer's disease Paternal Grandmother    Heart disease Neg Hx     Social History   Socioeconomic History   Marital status: Married    Spouse name: Not on file   Number of children: Not on file   Years of education: Not on file   Highest education level: Not on file  Occupational History   Not on file  Tobacco Use   Smoking status: Never   Smokeless tobacco: Never  Vaping Use   Vaping Use: Never used  Substance and Sexual Activity   Alcohol use: Yes    Alcohol/week: 12.0 standard drinks    Types: 12 Cans of beer per week   Drug use: No   Sexual activity: Yes    Partners: Male    Birth control/protection: Surgical    Comment: tubaligation.  Other Topics Concern   Not on file  Social History Narrative   Not on file   Social Determinants of Health   Financial Resource Strain: Not on file  Food Insecurity: Not on file  Transportation Needs: Not on file  Physical Activity: Not on file  Stress: Not on file  Social Connections: Not on file  Intimate Partner Violence: Not on file     Current Outpatient Medications:    benzonatate (TESSALON) 200 MG capsule, Take 1 capsule (200 mg total) by mouth 2 (two) times daily as needed for  cough., Disp: 20 capsule, Rfl: 0   chlorpheniramine-HYDROcodone (TUSSIONEX PENNKINETIC ER) 10-8 MG/5ML SUER, Take 5 mLs by mouth every 12 (twelve) hours as needed., Disp: 50 mL, Rfl: 0   Insulin Pen Needle (PEN NEEDLES 3/16") 31G X 5 MM MISC, 1 each by Does not apply route daily., Disp: 100 each, Rfl: 12   Liraglutide -Weight Management (SAXENDA) 18 MG/3ML SOPN, Inject 0.6 mg into the skin daily for 7 days, THEN 1.2 mg daily for 7 days, THEN 1.8 mg daily for 7 days, THEN 2.4 mg daily for 7 days, THEN 3 mg daily., Disp: 9 mL, Rfl: 3   methocarbamol (ROBAXIN) 500 MG tablet, Take 1 tablet (500 mg total) by mouth every 6 (six) hours. (Patient not taking: No sig reported), Disp: 80 tablet, Rfl: 0    predniSONE (DELTASONE) 10 MG tablet, 6 tabs today and tomorrow, 5 tabs the next 2 days, decrease by 1 every other day until gone., Disp: 42 tablet, Rfl: 0     ROS:  Review of Systems BREAST: No symptoms   Objective: There were no vitals taken for this visit.   OBGyn Exam  Results: No results found for this or any previous visit (from the past 24 hour(s)).  Assessment/Plan: No diagnosis found.  No orders of the defined types were placed in this encounter.            GYN counsel {counseling: 16159}     F/U  No follow-ups on file.  [name], PA-C 09/27/2021 2:43 PM

## 2021-10-01 ENCOUNTER — Ambulatory Visit: Payer: Managed Care, Other (non HMO) | Admitting: Obstetrics and Gynecology

## 2021-10-01 DIAGNOSIS — Z124 Encounter for screening for malignant neoplasm of cervix: Secondary | ICD-10-CM

## 2021-10-01 DIAGNOSIS — Z01419 Encounter for gynecological examination (general) (routine) without abnormal findings: Secondary | ICD-10-CM

## 2021-10-01 DIAGNOSIS — Z1151 Encounter for screening for human papillomavirus (HPV): Secondary | ICD-10-CM

## 2021-10-22 NOTE — Progress Notes (Signed)
PCP:  Valerie Roys, DO   Chief Complaint  Patient presents with   Gynecologic Exam    Pain during intercourse, low sex drive, rectal bleeding with bowel movement     HPI:      [name] is a 36 y.o. [id number] whose LMP was Patient's last menstrual period was 10/21/2021 (exact date)., presents today for her annual examination.  Her menses are regular every 28-30 days, lasting 4-5 days, heavy all the days, changing super tampon Q2 hrs with leaks, with 1/2 dollar sized clots, no BTB.  Dysmenorrhea significant, not improved with NSAIDs. Menses and dysmen are worsening.   Also with non-menstrual pain now, as well as dyspareunia. Has had issues with BR[facility] for several yrs, couldn't afford colonoscopy in the past. Bright red blood in stool and with wiping, with every BM. No pain with BM. Has also noticed about 20# wt loss since 12/22, even with increasing food consumption.   Sex activity: single partner, contraception - vasectomy. Pt now with deep dyspareunia with occas light bleeding afterwards. Pt has decreased libido, partly due to pelvic pain.  Last Pap: 05/09/17 Results were: no abnormalities /neg HPV DNA; no hx of abn pap with tx. Hx of STDs: ext warts in past, treated with cryotherapy; no recent lesions.  There is no FH of breast cancer. There is no FH of ovarian cancer. The patient does do self-breast exams. There is a FH of pnacreatic cancer in her mother, who is deceased. No genetic testing done.  Tobacco use: The patient denies current or previous tobacco use. Alcohol use: none No drug use.  Exercise: moderately active  She does get adequate calcium but not Vitamin D in her diet.  Patient Active Problem List   Diagnosis Date Noted   Family history of pancreatic cancer 10/23/2021   Rectal bleeding 10/23/2021   Obesity 07/04/2017   Acute anxiety 05/10/2016   Insomnia 05/10/2016   Heavy menstrual bleeding 03/29/2015   Low grade squamous intraepithelial lesion  (LGSIL) on Papanicolaou smear of cervix 09/29/2013    Past Surgical History:  Procedure Laterality Date   LUMBAR LAMINECTOMY/DECOMPRESSION MICRODISCECTOMY Left 06/07/2020   Procedure: LEFT L5-S1 MICRODISCECTOMY;  Surgeon: Meade Maw, MD;  Location: [facility];  Service: Neurosurgery;  Laterality: Left;   repair of fr left foot     THERAPEUTIC ABORTION     TUBAL LIGATION  04/21/2011   Procedure: POST PARTUM TUBAL LIGATION;  Surgeon: Osborne Oman, MD;  Location: [place] [facility];  Service: Gynecology;  Laterality: Bilateral;  Bilateral post partum tubal ligation with filshie clips.   tubalectomy Bilateral 2014    Family History  Problem Relation Age of Onset   Hypothyroidism Mother    Pancreatic cancer Mother 5   Hyperthyroidism Father    Stroke Father    Hypertension Father    Hypothyroidism Father    Multiple sclerosis Paternal Aunt    Diabetes Maternal Grandmother    COPD Maternal Grandfather    Stroke Paternal Grandmother    Alzheimer's disease Paternal Grandmother    Leukemia Paternal Grandfather    Heart disease Neg Hx     Social History   Socioeconomic History   Marital status: Married    Spouse name: Not on file   Number of children: Not on file   Years of education: Not on file   Highest education level: Not on file  Occupational History   Not on file  Tobacco Use   Smoking status: Never   Smokeless  tobacco: Never  Vaping Use   Vaping Use: Never used  Substance and Sexual Activity   Alcohol use: Yes    Alcohol/week: 12.0 standard drinks    Types: 12 Cans of beer per week   Drug use: No   Sexual activity: Yes    Partners: Male    Birth control/protection: Surgical  Other Topics Concern   Not on file  Social History Narrative   Not on file   Social Determinants of Health   Financial Resource Strain: Not on file  Food Insecurity: Not on file  Transportation Needs: Not on file  Physical Activity: Not on file  Stress: Not on file  Social Connections:  Not on file  Intimate Partner Violence: Not on file    No current outpatient medications on file.     ROS:  Review of Systems  Constitutional:  Positive for unexpected weight change. Negative for fatigue and fever.  Respiratory:  Negative for cough, shortness of breath and wheezing.   Cardiovascular:  Negative for chest pain, palpitations and leg swelling.  Gastrointestinal:  Positive for blood in stool. Negative for abdominal pain, constipation, diarrhea, nausea and vomiting.  Endocrine: Negative for cold intolerance, heat intolerance and polyuria.  Genitourinary:  Positive for menstrual problem and pelvic pain. Negative for dyspareunia, dysuria, flank pain, frequency, genital sores, hematuria, urgency, vaginal bleeding, vaginal discharge and vaginal pain.  Musculoskeletal:  Negative for back pain, joint swelling and myalgias.  Skin:  Negative for rash.  Neurological:  Negative for dizziness, syncope, light-headedness, numbness and headaches.  Hematological:  Negative for adenopathy.  Psychiatric/Behavioral:  Negative for agitation, confusion, sleep disturbance and suicidal ideas. The patient is not nervous/anxious.   BREAST: No symptoms   Objective: BP 110/70    Ht 5' 8"  (1.727 m)    Wt 192 lb (87.1 kg)    LMP 10/21/2021 (Exact Date)    BMI 29.19 kg/m    Physical Exam Constitutional:      Appearance: She is well-developed.  Genitourinary:     Vulva normal.     Genitourinary Comments: FOBT NOT DONE DUE TO VAGINAL BLEEDING; QUESTION INTERNAL HEMORRHOID     Right Labia: No rash, tenderness or lesions.    Left Labia: No tenderness, lesions or rash.    Vaginal bleeding present.     No vaginal discharge, erythema or tenderness.      Right Adnexa: not tender and no mass present.    Left Adnexa: not tender and no mass present.    No cervical motion tenderness, friability or polyp.     Uterus is tender.     Uterus is not enlarged.  Rectum:     Internal hemorrhoid present.      No rectal mass, anal fissure or external hemorrhoid.  Breasts:    Right: No mass, nipple discharge, skin change or tenderness.     Left: No mass, nipple discharge, skin change or tenderness.  Neck:     Thyroid: No thyromegaly.  Cardiovascular:     Rate and Rhythm: Normal rate and regular rhythm.     Heart sounds: Normal heart sounds. No murmur heard. Pulmonary:     Effort: Pulmonary effort is normal.     Breath sounds: Normal breath sounds.  Abdominal:     Palpations: Abdomen is soft.     Tenderness: There is abdominal tenderness in the suprapubic area. There is no guarding or rebound.    Musculoskeletal:        General: Normal range of motion.  Cervical back: Normal range of motion.  Lymphadenopathy:     Cervical: No cervical adenopathy.  Neurological:     General: No focal deficit present.     Mental Status: She is alert and oriented to person, place, and time.     Cranial Nerves: No cranial nerve deficit.  Skin:    General: Skin is warm and dry.  Psychiatric:        Mood and Affect: Mood normal.        Behavior: Behavior normal.        Thought Content: Thought content normal.        Judgment: Judgment normal.  Vitals reviewed.    Assessment/Plan: Encounter for annual routine gynecological examination  Cervical cancer screening - Plan: Cytology - PAP  Screening for HPV (human papillomavirus) - Plan: Cytology - PAP  Menorrhagia with regular cycle - Plan: CBC with Differential/Platelet, US PELVIS TRANSVAGINAL NON-OB (TV ONLY); check labs and GYN u/s. Will f/u with results and mgmt options. Need to also eval GI issues.   Pelvic pain - Plan: US PELVIS TRANSVAGINAL NON-OB (TV ONLY); check u/s, if neg, most likely GI related.  Rectal bleeding - Plan: Ambulatory referral to Gastroenterology; refer to GI for eval/mgmt. With wt loss, pelvic pain.  Family history of pancreatic cancer - Plan: [facility] ([place]); My Risk testing discussed  and done today. Will f/u with results.     GYN counsel adequate intake of calcium and vitamin D, diet and exercise     F/U  Return in about 2 weeks (around 11/06/2021) for GYN u/s for menorrhagia/pelvic pain--[facility] to call pt.  Symiah Nowotny B. Era Parr, PA-C 10/23/2021 9:49 AM

## 2021-10-23 ENCOUNTER — Other Ambulatory Visit: Payer: Self-pay

## 2021-10-23 ENCOUNTER — Ambulatory Visit (INDEPENDENT_AMBULATORY_CARE_PROVIDER_SITE_OTHER): Payer: Managed Care, Other (non HMO) | Admitting: Obstetrics and Gynecology

## 2021-10-23 ENCOUNTER — Encounter: Payer: Self-pay | Admitting: Obstetrics and Gynecology

## 2021-10-23 ENCOUNTER — Other Ambulatory Visit (HOSPITAL_COMMUNITY)
Admission: RE | Admit: 2021-10-23 | Discharge: 2021-10-23 | Disposition: A | Discharge status: Home / Self Care | Payer: Managed Care, Other (non HMO) | Source: Ambulatory Visit | Attending: Obstetrics and Gynecology | Admitting: Obstetrics and Gynecology

## 2021-10-23 VITALS — BP 110/70 | Ht 68.0 in | Wt 192.0 lb

## 2021-10-23 DIAGNOSIS — Z124 Encounter for screening for malignant neoplasm of cervix: Secondary | ICD-10-CM | POA: Insufficient documentation

## 2021-10-23 DIAGNOSIS — Z1151 Encounter for screening for human papillomavirus (HPV): Secondary | ICD-10-CM

## 2021-10-23 DIAGNOSIS — N920 Excessive and frequent menstruation with regular cycle: Secondary | ICD-10-CM

## 2021-10-23 DIAGNOSIS — Z01419 Encounter for gynecological examination (general) (routine) without abnormal findings: Secondary | ICD-10-CM

## 2021-10-23 DIAGNOSIS — K625 Hemorrhage of anus and rectum: Secondary | ICD-10-CM

## 2021-10-23 DIAGNOSIS — R102 Pelvic and perineal pain: Secondary | ICD-10-CM

## 2021-10-23 DIAGNOSIS — Z800 Family history of malignant neoplasm of digestive organs: Secondary | ICD-10-CM

## 2021-10-24 ENCOUNTER — Telehealth: Payer: Self-pay

## 2021-10-24 LAB — CBC WITH DIFFERENTIAL/PLATELET
Basophils Absolute: 0 10*3/uL (ref 0.0–0.2)
Basos: 1 %
EOS (ABSOLUTE): 0.1 10*3/uL (ref 0.0–0.4)
Eos: 2 %
Hematocrit: 38.7 % (ref 34.0–46.6)
Hemoglobin: 13.3 g/dL (ref 11.1–15.9)
Immature Grans (Abs): 0 10*3/uL (ref 0.0–0.1)
Immature Granulocytes: 0 %
Lymphocytes Absolute: 1.5 10*3/uL (ref 0.7–3.1)
Lymphs: 30 %
MCH: 31.7 pg (ref 26.6–33.0)
MCHC: 34.4 g/dL (ref 31.5–35.7)
MCV: 92 fL (ref 79–97)
Monocytes Absolute: 0.4 10*3/uL (ref 0.1–0.9)
Monocytes: 9 %
Neutrophils Absolute: 3 10*3/uL (ref 1.4–7.0)
Neutrophils: 58 %
Platelets: 276 10*3/uL (ref 150–450)
RBC: 4.19 x10E6/uL (ref 3.77–5.28)
RDW: 12.6 % (ref 11.7–15.4)
WBC: 5.1 10*3/uL (ref 3.4–10.8)

## 2021-10-24 NOTE — Telephone Encounter (Signed)
CALLED PATIENT NO ANSWER LEFT VOICEMAIL FOR A CALL BACK ? ?

## 2021-10-25 ENCOUNTER — Telehealth: Payer: Self-pay

## 2021-10-25 NOTE — Telephone Encounter (Signed)
Scheduled for 01/07/2022 °

## 2021-10-29 LAB — CYTOLOGY - PAP
Comment: NEGATIVE
Diagnosis: NEGATIVE
High risk HPV: NEGATIVE

## 2021-11-08 ENCOUNTER — Ambulatory Visit: Payer: Managed Care, Other (non HMO)

## 2021-11-08 DIAGNOSIS — N920 Excessive and frequent menstruation with regular cycle: Secondary | ICD-10-CM

## 2021-11-08 DIAGNOSIS — R102 Pelvic and perineal pain: Secondary | ICD-10-CM

## 2021-11-14 IMAGING — MR MR LUMBAR SPINE W/O CM
5 series · 31 of 48 positions shown · non-contrast
Comparison: MRI of the lumbar spine April 30, 2018

CLINICAL DATA: Low back pain with left leg pain

EXAM:
MRI LUMBAR SPINE WITHOUT CONTRAST
TECHNIQUE: Multiplanar, multisequence MR imaging of the lumbar spine was
performed. No intravenous contrast was administered.

[Series 5: T2 · sagittal · 4.0mm · 0.81mm/px · 6 of 17 slices shown (1 of 2)]
[im 1/17]
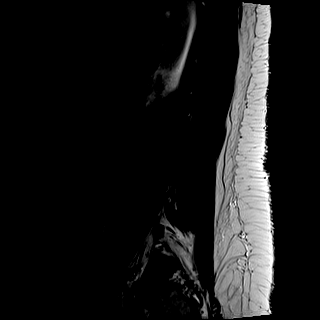
[im 4/17]
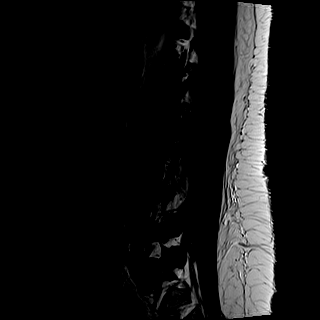
[im 7/17]
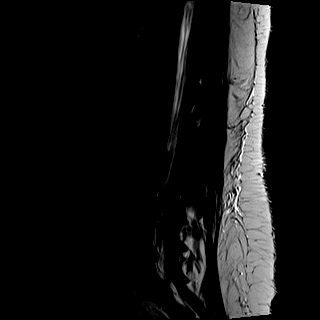
[im 10/17]
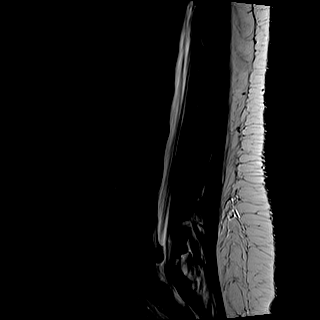
[im 13/17]
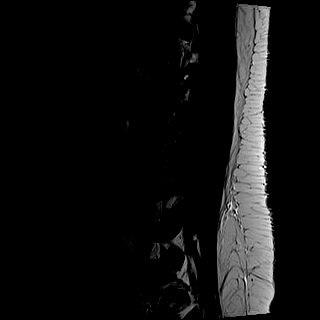
[im 17/17]
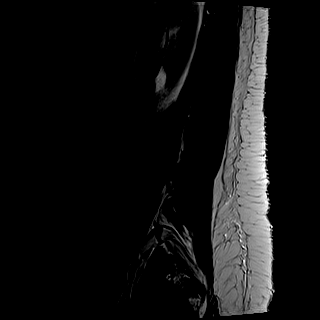

[Series 6: T1 · sagittal · 4.0mm · 0.81mm/px · 7 of 17 slices shown (1 of 2)]
[im 1/17]
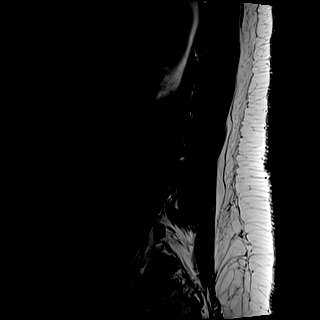
[im 3/17]
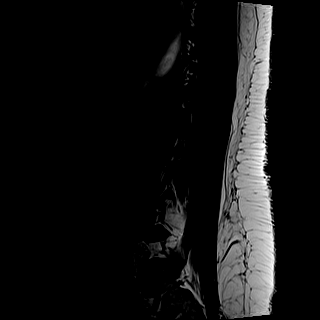
[im 6/17]
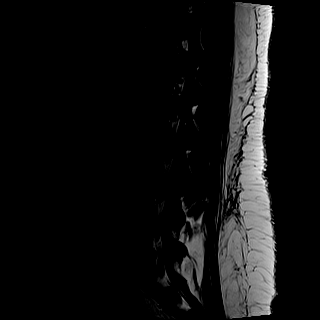
[im 9/17]
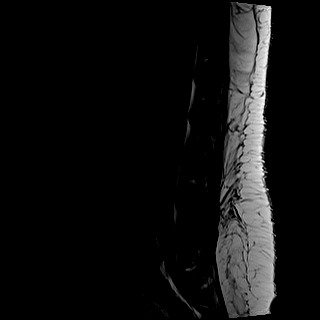
[im 11/17]
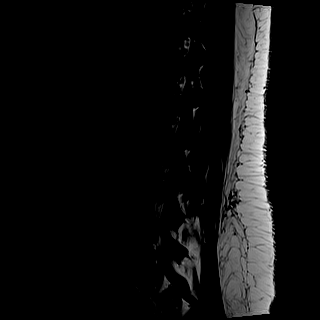
[im 14/17]
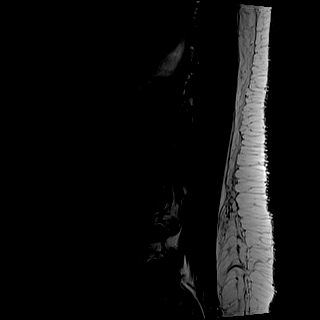
[im 17/17]
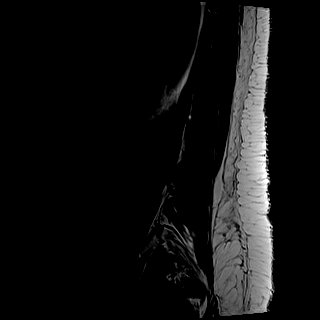

[Series 7: STIR · sagittal · 4.0mm · 0.41mm/px · 2 of 17 slices shown]
[im 1/17]
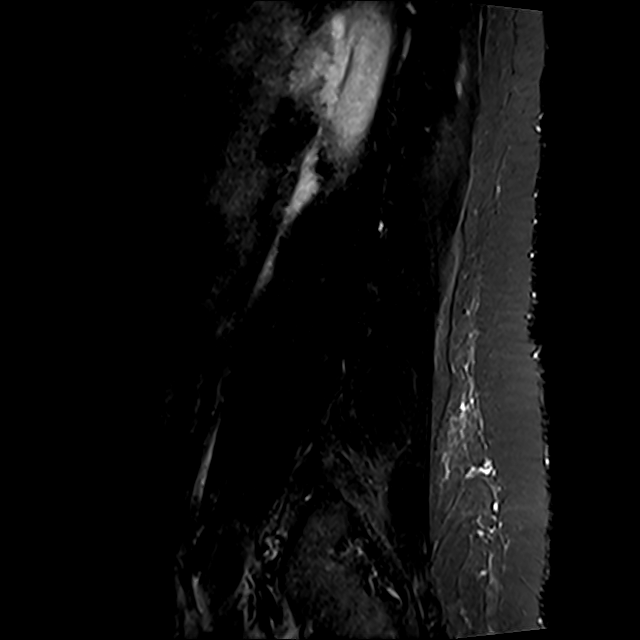
[im 3/17]
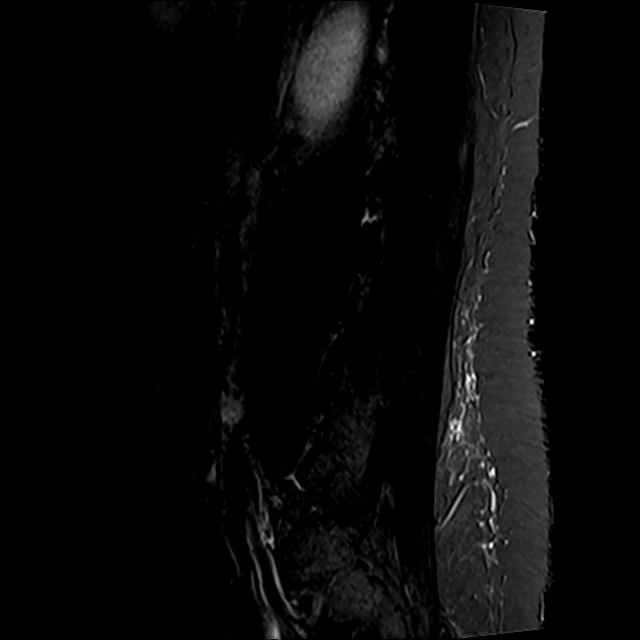

[Series 8: T2 · axial · 4.0mm · 0.78mm/px · z∈[-137,+73]mm · 8 of 37 slices shown (2 of 2)]
[im 1/37]
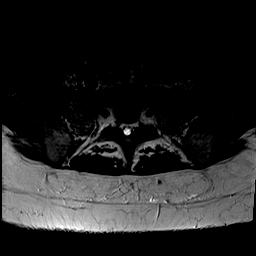
[im 6/37]
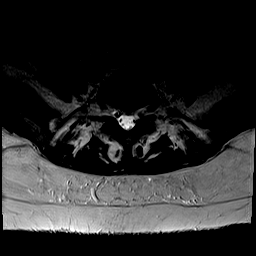
[im 12/37]
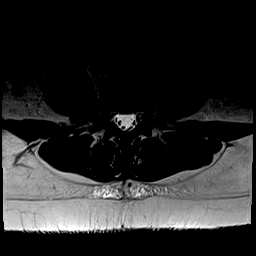
[im 17/37]
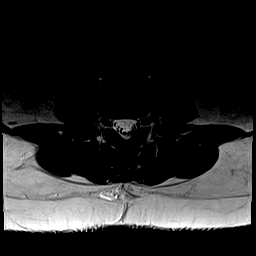
[im 20/37]
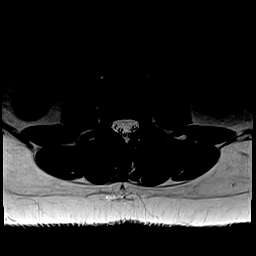
[im 25/37]
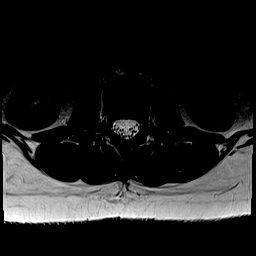
[im 31/37]
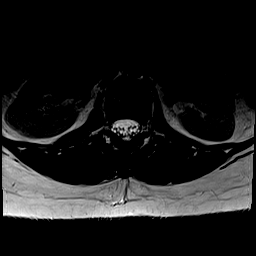
[im 37/37]
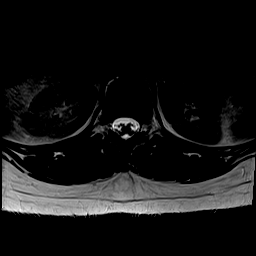

[Series 9: T1 · axial · 4.0mm · 0.39mm/px · z∈[-137,+73]mm · 8 of 37 slices shown (2 of 2)]
[im 1/37]
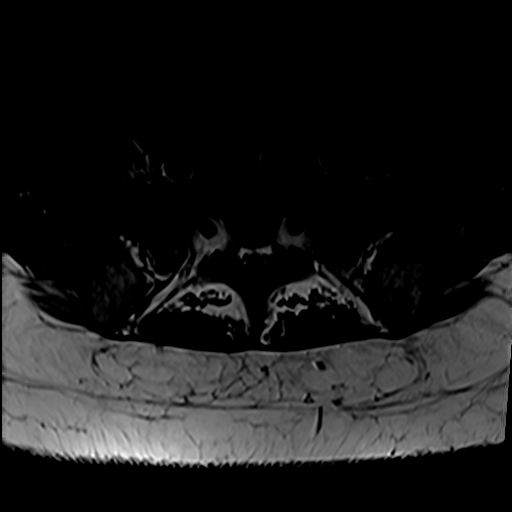
[im 6/37]
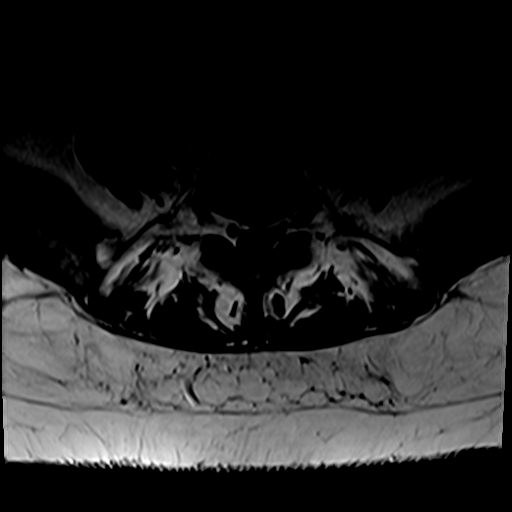
[im 12/37]
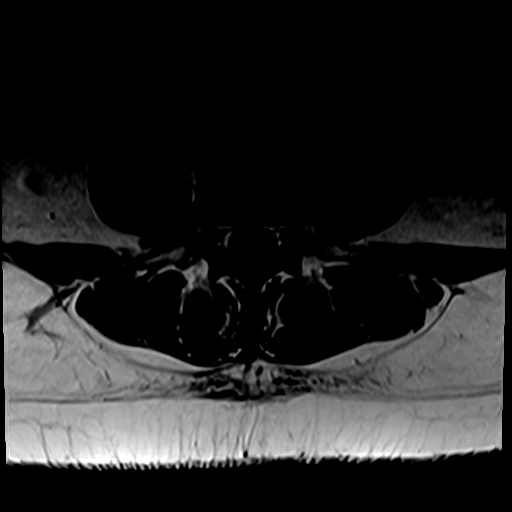
[im 17/37]
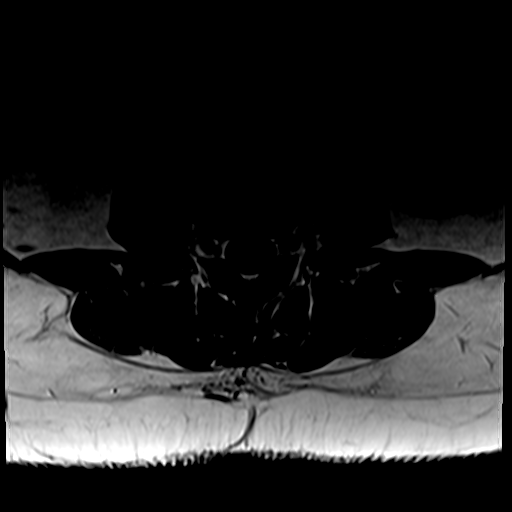
[im 20/37]
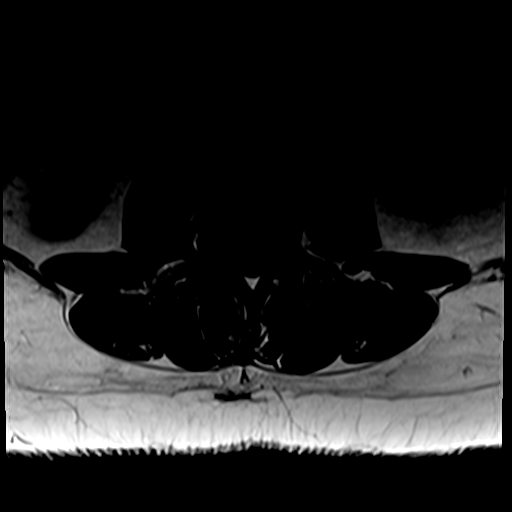
[im 25/37]
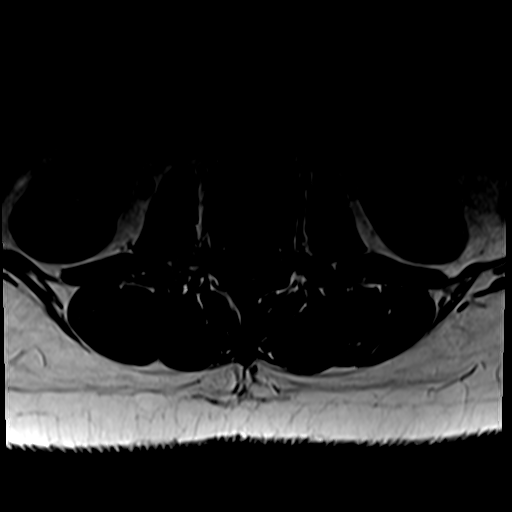
[im 31/37]
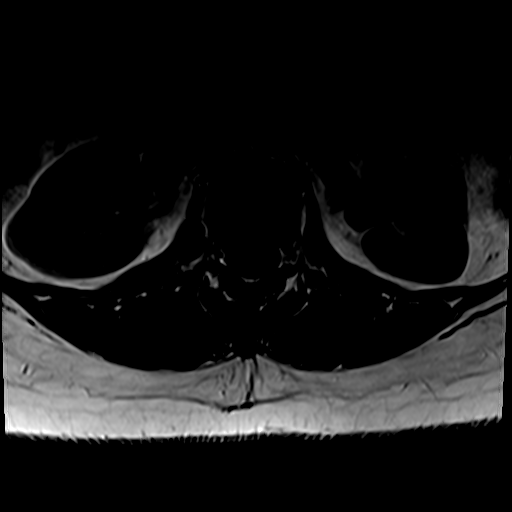
[im 37/37]
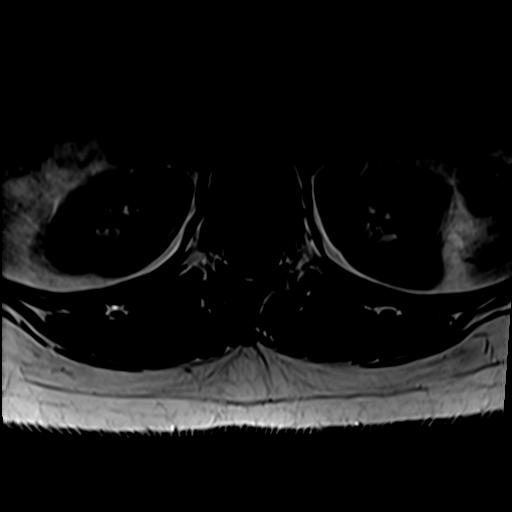

[31 of 48 positions shown; findings below may reference images not displayed]

FINDINGS: Segmentation:  Standard.

Alignment:  Straightening the lumbar lordosis, may be positional.

Vertebrae: No fracture, evidence of discitis, or bone lesion.
Endplate degenerative changes at L5-S1, progressed from prior MRI.

Conus medullaris and cauda equina: Conus extends to the T12-L1
level. Conus and cauda equina appear normal.

Paraspinal and other soft tissues: Negative.

Disc levels:

T12-L1: No spinal canal or neural foraminal stenosis.

L1-2: No spinal canal or neural foraminal stenosis.

L2-3: No spinal canal or neural foraminal stenosis.

L3-4: No spinal canal or neural foraminal stenosis.

L4-5: No spinal canal or neural foraminal stenosis.

L5-S1: Loss of disc height, disc bulge with superimposed left
central disc protrusion impinging on the descending left S1 nerve
root and causing posterior displacement of the left S2 nerve root.
The disc protrusion has increased since prior MRI. No significant
spinal canal or neural foraminal stenosis.
IMPRESSION: 1. Increased size of left central disc protrusion at L5-S1 impinging
on the descending left S1 nerve root and causing posterior
displacement of the left S2 nerve root.
2. No significant spinal canal or neural foraminal stenosis at any
level.

## 2021-11-19 ENCOUNTER — Telehealth: Payer: Self-pay

## 2021-11-19 NOTE — Telephone Encounter (Signed)
I contacted patient via phone to scheduled for Ultrasound appointment and [facility] to call with results. Called and left voicemail for patient to call back to be scheduled.

## 2021-11-20 NOTE — Telephone Encounter (Signed)
Contacted patient via phone. Patient states she is going to contact insurance prior to scheduling to find out how much is covered. Advised patient to contact me back about scheduling

## 2022-01-02 ENCOUNTER — Telehealth: Payer: Self-pay

## 2022-01-02 NOTE — Telephone Encounter (Signed)
I called [facility] to f/u on test and was advised it was cancelled on 12/10/21 and we never received fax notification.  Pt no showed to appt with genetic counselor, attempt to r/s made and no answer. They faxed canceled test notification and I have reached out to pt to f/u. Says she probably did miss the appt with genetic counselor. She wants to continue test so I gave her number to call back and start process again [phone]). ?

## 2022-01-07 ENCOUNTER — Ambulatory Visit: Payer: Managed Care, Other (non HMO) | Admitting: Gastroenterology

## 2022-03-21 ENCOUNTER — Encounter: Payer: Self-pay | Admitting: Neurosurgery

## 2022-03-21 ENCOUNTER — Ambulatory Visit (INDEPENDENT_AMBULATORY_CARE_PROVIDER_SITE_OTHER): Payer: Managed Care, Other (non HMO) | Admitting: Neurosurgery

## 2022-03-21 VITALS — BP 137/77 | HR 97 | Ht 68.0 in | Wt 195.2 lb

## 2022-03-21 DIAGNOSIS — R29898 Other symptoms and signs involving the musculoskeletal system: Secondary | ICD-10-CM

## 2022-03-21 DIAGNOSIS — M5442 Lumbago with sciatica, left side: Secondary | ICD-10-CM | POA: Diagnosis not present

## 2022-03-21 DIAGNOSIS — M5416 Radiculopathy, lumbar region: Secondary | ICD-10-CM

## 2022-03-21 MED ORDER — METHYLPREDNISOLONE 4 MG PO TBPK: ORAL_TABLET | ORAL | 0 refills | Status: DC

## 2022-03-21 MED ORDER — TRAMADOL HCL 50 MG PO TABS: 50.0000 mg | ORAL_TABLET | Freq: Every day | ORAL | 0 refills | Status: AC | PRN

## 2022-03-21 NOTE — Progress Notes (Signed)
Referring Physician:  Dorcas Carrow, DO [address] [place],  [place] [address]  Primary Physician:  Dorcas Carrow, DO  History of Present Illness: 03/21/2022 [name] is a 36 y.o with a history of lumbar decompression who is here today with a chief complaint of numbness in buttocks and down left leg for the last 4-6 weeks with worsening. She has been seen by neurosurgery in the past for similar symptoms on both the left and right side and was primarily treated conservatively.  Today she states that her symptoms have been going on for little over a month.  She describes pain that starts in her left low back and numbness down her left posterior thigh with associated pins and needle sensation in her anterior shin and top of her left foot.  This is significantly worse with standing for prolonged periods of time and improves some with rest.  She also endorses having done stretches given to her by physical therapy at home which have not improved her symptoms and are difficult for her to do.  She feels as though she has some weakness in her left leg.  She has tried Biofreeze, Lidoderm patches, Voltaren gel, and ibuprofen without any significant relief.  She denies any similar right-sided symptoms.   Conservative measures:  Physical therapy: no recent formal PT  Multimodal medical therapy including regular antiinflammatories: as above. Injections: no recent epidural steroid injections 05/22/2020: Left S1 transforaminal ESI (no relief) 03/01/2020: Left S1 transforaminal ESI (good relief) 01/14/2018: Left S1 transforaminal ESI (good relief) 03/20/2018: Left S1 transforaminal ESI (good relief for 3 to 4 days) 03/18/2018: Left S1 transforaminal ESI (aborted due to fainting)   10/11/2021 note from Dr. Myer Haff Starting approximately 2 weeks ago, [name] began having pain down her right leg. She previously had pain down her left leg. The pain is very similar to what she had on her left  leg, but on the right. She has pain in her buttock down the back of her thigh to the back of her calf. Is made worse by walking and standing. Bending forward makes it worse. Her pain is now quite severe. She denies any weakness. She has not tried any therapy at this point.  Past Surgery: Left L5-S1 microdiscectomy 06/07/20 and surgery at age 80  [name] has no symptoms of cervical myelopathy.  The symptoms are causing a significant impact on the patient's life.   Review of Systems:  A 10 point review of systems is negative, except for the pertinent positives and negatives detailed in the HPI.  Past Medical History: Past Medical History:  Diagnosis Date   Abnormal Pap smear    Anxiety    Complication of anesthesia    N/V, passing out with epidural   Headache(784.0)    Ovarian cyst    Preterm labor    Seizures (HCC)    Urinary tract infection     Past Surgical History: Past Surgical History:  Procedure Laterality Date   LUMBAR LAMINECTOMY/DECOMPRESSION MICRODISCECTOMY Left 06/07/2020   Procedure: LEFT L5-S1 MICRODISCECTOMY;  Surgeon: Venetia Night, MD;  Location: [facility] ORS;  Service: Neurosurgery;  Laterality: Left;   repair of fr left foot     THERAPEUTIC ABORTION     TUBAL LIGATION  04/21/2011   Procedure: POST PARTUM TUBAL LIGATION;  Surgeon: Tereso Newcomer, MD;  Location: [facility];  Service: Gynecology;  Laterality: Bilateral;  Bilateral post partum tubal ligation with filshie clips.   tubalectomy Bilateral 2014  Allergies: Allergies as of 03/21/2022 - Review Complete 10/23/2021  Allergen Reaction Noted   Contrave [naltrexone-bupropion hcl er] Nausea Only 06/06/2017    Medications: No outpatient encounter medications on file as of 03/21/2022.   No facility-administered encounter medications on file as of 03/21/2022.    Social History: Social History   Tobacco Use   Smoking status: Never   Smokeless tobacco: Never  Vaping Use   Vaping Use: Never used   Substance Use Topics   Alcohol use: Yes    Alcohol/week: 12.0 standard drinks of alcohol    Types: 12 Cans of beer per week   Drug use: No    Family Medical History: Family History  Problem Relation Age of Onset   Hypothyroidism Mother    Pancreatic cancer Mother 73   Hyperthyroidism Father    Stroke Father    Hypertension Father    Hypothyroidism Father    Multiple sclerosis Paternal Aunt    Diabetes Maternal Grandmother    COPD Maternal Grandfather    Stroke Paternal Grandmother    Alzheimer's disease Paternal Grandmother    Leukemia Paternal Grandfather    Heart disease Neg Hx     Physical Examination:  Vitals:   03/21/22 1313  BP: 137/77  Pulse: 97     General: Patient is well developed, well nourished, calm, collected, and in no apparent distress. Attention to examination is appropriate.  Psychiatric: Patient is non-anxious.  Head:  Pupils equal, round, and reactive to light.  ENT:  Oral mucosa appears well hydrated.  Neck:   Supple.   Respiratory: Patient is breathing without any difficulty.  Extremities: No edema.  Vascular: Palpable dorsal pedal pulses.  Skin:   On exposed skin, there are no abnormal skin lesions.  NEUROLOGICAL:     Awake, alert, oriented to person, place, and time.  Speech is clear and fluent. Fund of knowledge is appropriate.   Cranial Nerves: Pupils equal round and reactive to light.  Facial tone is symmetric.  Facial sensation is symmetric. Shoulder shrug is symmetric.  ROM of spine: full.  Palpation of spine: non tender.    Strength: Side Biceps Triceps Deltoid Interossei Grip Wrist Ext. Wrist Flex.  R 5 5 5 5 5 5 5   L 5 5 5 5 5 5 5    Side Iliopsoas Quads Hamstring PF DF EHL  R 5 5 5 5 5 5   L 5 5 5 5  4+ 4-   Reflexes are 2+ and symmetric at the biceps, triceps, brachioradialis, patella and achilles.   Hoffman's is absent.  Clonus is not present.  Toes are down-going.  Bilateral upper and lower extremity sensation  is intact to light touch.    Gait is normal.    Medical Decision Making  Imaging: No recent spinal imaging   Assessment and Plan: [name] is a pleasant 36 y.o. female with a long standing history of lumbosacral complaints with intermittent lumbar radiculopathy.  Her most recent episode has been going on for the last 4 to 6 weeks and has progressively gotten worse despite conservative treatment with medications and home physical therapy exercises.  Given her history and left foot weakness, I would like to move forward with obtaining an updated lumbar MRI.  I have placed an order for a Medrol Dosepak and encouraged her to discontinue ibuprofen while taking this.  I have also given her prescription for tramadol to take as needed and discussed the side effects with her.  I have also placed a referral  to physical therapy.  I will see her back after completion of her MRI and initiation of physical therapy to discuss further plan of care.  She was encouraged to call the office in the interim should she have any questions or concerns.  She expressed understanding was in agreement with this plan.  Thank you for involving me in the care of this patient.   I spent a total of 30 minutes in both face-to-face and non-face-to-face activities for this visit on the date of this encounter including chart review, discussion of current symptoms, physical exam, differential diagnosis, order placement, and documentation.  [facility]

## 2022-03-26 ENCOUNTER — Telehealth: Payer: Self-pay

## 2022-03-26 NOTE — Telephone Encounter (Signed)
-----   Message from Rockey Situ sent at 03/26/2022  1:08 PM EDT ----- Regarding: pt not better Contact: [phone] Patient finished up the prednisone and feels no difference. She can not take trazodone while she is at work and she stands a lot. What do you recommend she can take during the day for pain? No one has contacted her about the MRI yet. [facility]

## 2022-03-27 NOTE — Telephone Encounter (Signed)
She is not taking anything OTC because Duwayne Heck told her not to take tylenol while on the prednisone. She is fine with going to [facility] for her MRI.

## 2022-03-29 NOTE — Telephone Encounter (Signed)
Patient has been advised to take Tylenol or Ibuprofen and to be expecting a call from [facility] for MRI.

## 2022-03-29 NOTE — Telephone Encounter (Signed)
Left message to call back  

## 2022-04-03 ENCOUNTER — Ambulatory Visit
Admission: RE | Admit: 2022-04-03 | Discharge: 2022-04-03 | Disposition: A | Discharge status: Home / Self Care | Payer: Managed Care, Other (non HMO) | Source: Ambulatory Visit | Attending: Neurosurgery | Admitting: Neurosurgery

## 2022-04-03 DIAGNOSIS — M5442 Lumbago with sciatica, left side: Secondary | ICD-10-CM

## 2022-04-03 DIAGNOSIS — M5416 Radiculopathy, lumbar region: Secondary | ICD-10-CM

## 2022-04-05 ENCOUNTER — Other Ambulatory Visit: Payer: Self-pay | Admitting: Neurosurgery

## 2022-04-05 MED ORDER — GABAPENTIN 100 MG PO CAPS: 100.0000 mg | ORAL_CAPSULE | Freq: Three times a day (TID) | ORAL | 0 refills | Status: DC

## 2022-04-12 ENCOUNTER — Ambulatory Visit (INDEPENDENT_AMBULATORY_CARE_PROVIDER_SITE_OTHER): Payer: Managed Care, Other (non HMO) | Admitting: Neurosurgery

## 2022-04-12 ENCOUNTER — Encounter: Payer: Self-pay | Admitting: Neurosurgery

## 2022-04-12 ENCOUNTER — Encounter
Admission: RE | Admit: 2022-04-12 | Discharge: 2022-04-12 | Disposition: A | Discharge status: Home / Self Care | Payer: Managed Care, Other (non HMO) | Source: Ambulatory Visit | Attending: Neurosurgery | Admitting: Neurosurgery

## 2022-04-12 ENCOUNTER — Telehealth: Payer: Self-pay

## 2022-04-12 ENCOUNTER — Other Ambulatory Visit: Payer: Self-pay

## 2022-04-12 VITALS — BP 128/82 | Ht 68.0 in | Wt 194.0 lb

## 2022-04-12 DIAGNOSIS — N920 Excessive and frequent menstruation with regular cycle: Secondary | ICD-10-CM | POA: Insufficient documentation

## 2022-04-12 DIAGNOSIS — G834 Cauda equina syndrome: Secondary | ICD-10-CM

## 2022-04-12 DIAGNOSIS — M5416 Radiculopathy, lumbar region: Secondary | ICD-10-CM

## 2022-04-12 DIAGNOSIS — Z01812 Encounter for preprocedural laboratory examination: Secondary | ICD-10-CM

## 2022-04-12 DIAGNOSIS — R29898 Other symptoms and signs involving the musculoskeletal system: Secondary | ICD-10-CM

## 2022-04-12 DIAGNOSIS — E669 Obesity, unspecified: Secondary | ICD-10-CM | POA: Diagnosis not present

## 2022-04-12 DIAGNOSIS — M5116 Intervertebral disc disorders with radiculopathy, lumbar region: Secondary | ICD-10-CM

## 2022-04-12 DIAGNOSIS — Z01818 Encounter for other preprocedural examination: Secondary | ICD-10-CM | POA: Diagnosis present

## 2022-04-12 DIAGNOSIS — Z01810 Encounter for preprocedural cardiovascular examination: Secondary | ICD-10-CM

## 2022-04-12 LAB — TYPE AND SCREEN
ABO/RH(D): A POS
Antibody Screen: NEGATIVE

## 2022-04-12 LAB — CBC
HCT: 39 % (ref 36.0–46.0)
Hemoglobin: 13.7 g/dL (ref 12.0–15.0)
MCH: 32.1 pg (ref 26.0–34.0)
MCHC: 35.1 g/dL (ref 30.0–36.0)
MCV: 91.3 fL (ref 80.0–100.0)
Platelets: 252 10*3/uL (ref 150–400)
RBC: 4.27 MIL/uL (ref 3.87–5.11)
RDW: 12.3 % (ref 11.5–15.5)
WBC: 5.7 10*3/uL (ref 4.0–10.5)
nRBC: 0 % (ref 0.0–0.2)

## 2022-04-12 LAB — URINALYSIS, COMPLETE (UACMP) WITH MICROSCOPIC
Bacteria, UA: NONE SEEN
Bilirubin Urine: NEGATIVE
Glucose, UA: NEGATIVE mg/dL
Ketones, ur: NEGATIVE mg/dL
Nitrite: NEGATIVE
Protein, ur: NEGATIVE mg/dL
Specific Gravity, Urine: 1.004 — ABNORMAL LOW (ref 1.005–1.030)
pH: 6 (ref 5.0–8.0)

## 2022-04-12 LAB — BASIC METABOLIC PANEL
Anion gap: 6 (ref 5–15)
BUN: 9 mg/dL (ref 6–20)
CO2: 27 mmol/L (ref 22–32)
Calcium: 9 mg/dL (ref 8.9–10.3)
Chloride: 105 mmol/L (ref 98–111)
Creatinine, Ser: 0.71 mg/dL (ref 0.44–1.00)
GFR, Estimated: >60 mL/min (ref >60)
Glucose, Bld: 99 mg/dL (ref 70–99)
Potassium: 3.4 mmol/L — ABNORMAL LOW (ref 3.5–5.1)
Sodium: 138 mmol/L (ref 135–145)

## 2022-04-12 LAB — SURGICAL PCR SCREEN
MRSA, PCR: NEGATIVE
Staphylococcus aureus: NEGATIVE

## 2022-04-12 MED ORDER — OXYCODONE HCL 5 MG PO TABS: 5.0000 mg | ORAL_TABLET | Freq: Four times a day (QID) | ORAL | 0 refills | Status: DC | PRN

## 2022-04-12 MED ORDER — GABAPENTIN 300 MG PO CAPS: 300.0000 mg | ORAL_CAPSULE | Freq: Three times a day (TID) | ORAL | 0 refills | Status: DC

## 2022-04-12 NOTE — Patient Instructions (Signed)
Please see below for information in regards to your upcoming surgery:  Planned surgery: Left L5-S1 microdiscectomy (Code 94327)  Pre-service center phone # [phone]  Surgery date: 04/19/22 - you will find out your arrival time the business day before your surgery.   Pre-op appointment at [facility] Pre-admit Testing: we will call you with a date/time for this. Pre-admit testing is located on the first floor of the [facility], [address].   Pre-op labs may be done at your pre-op appointment. You are not required to fast for these labs.    Should you need to change your pre-op appointment, please call Pre-admit testing at [phone].     If you have FMLA/disability paperwork, please drop it off or fax it to [phone], attention Patty.   If you have any questions/concerns before or after surgery, you can reach [place] at [phone], or you can send a mychart message. If you have a concern after hours that cannot wait until normal business hours, you can call [phone] or [phone] and ask the answering service to page the neurosurgeon on call.   Appointments/FMLA & disability paperwork: [place] Nurse: Royston Cowper  Medical assistant: Irving Burton Physician Assistant's: [facility] & Drake Leach Surgeon: Venetia Night, MD

## 2022-04-12 NOTE — H&P (View-Only) (Signed)
Referring Physician:  Dorcas Carrow, DO [address] [place],  [place] [address]  Primary Physician:  Dorcas Carrow, DO  DOS: 06/07/22 (L L5/S1 microdiscectomy)  History of Present Illness: 04/12/2022 [name] is here today with a chief complaint of worsening left leg pain and weakness.  Her leg is now giving out.  She has had symptoms now for approximately 3 to 4 months but has been rapidly worsening over the past 3 to 4 weeks.  She is now having trouble with walking and bearing weight.  She is also noticed some trouble with initiating urination.  03/21/2022 from [facility] [name] is a 36 y.o with a history of lumbar decompression who is here today with a chief complaint of numbness in buttocks and down left leg for the last 4-6 weeks with worsening. She has been seen by neurosurgery in the past for similar symptoms on both the left and right side and was primarily treated conservatively.  Today she states that her symptoms have been going on for little over a month.  She describes pain that starts in her left low back and numbness down her left posterior thigh with associated pins and needle sensation in her anterior shin and top of her left foot.  This is significantly worse with standing for prolonged periods of time and improves some with rest.  She also endorses having done stretches given to her by physical therapy at home which have not improved her symptoms and are difficult for her to do.  She feels as though she has some weakness in her left leg.  She has tried Biofreeze, Lidoderm patches, Voltaren gel, and ibuprofen without any significant relief.  She denies any similar right-sided symptoms.     Conservative measures:  Physical therapy: no recent formal PT  Multimodal medical therapy including regular antiinflammatories: as above. Injections: no recent epidural steroid injections 05/22/2020: Left S1 transforaminal ESI (no relief) 03/01/2020: Left S1  transforaminal ESI (good relief) 01/14/2018: Left S1 transforaminal ESI (good relief) 03/20/2018: Left S1 transforaminal ESI (good relief for 3 to 4 days) 03/18/2018: Left S1 transforaminal ESI (aborted due to fainting)    10/11/2021 note from Dr. Myer Haff Starting approximately 2 weeks ago, [name] began having pain down her right leg. She previously had pain down her left leg. The pain is very similar to what she had on her left leg, but on the right. She has pain in her buttock down the back of her thigh to the back of her calf. Is made worse by walking and standing. Bending forward makes it worse. Her pain is now quite severe. She denies any weakness. She has not tried any therapy at this point.   Past Surgery: Left L5-S1 microdiscectomy 06/07/20 and surgery at age 36   [name] has no symptoms of cervical myelopathy.   The symptoms are causing a significant impact on the patient's life.   Review of Systems:  A 10 point review of systems is negative, except for the pertinent positives and negatives detailed in the HPI.  Past Medical History: Past Medical History:  Diagnosis Date   Abnormal Pap smear    Anxiety    Complication of anesthesia    N/V, passing out with epidural   Headache(784.0)    Ovarian cyst    Preterm labor    Seizures (HCC)    Urinary tract infection     Past Surgical History: Past Surgical History:  Procedure Laterality  Date   LUMBAR LAMINECTOMY/DECOMPRESSION MICRODISCECTOMY Left 06/07/2020   Procedure: LEFT L5-S1 MICRODISCECTOMY;  Surgeon: Venetia Night, MD;  Location: [facility] ORS;  Service: Neurosurgery;  Laterality: Left;   repair of fr left foot     THERAPEUTIC ABORTION     TUBAL LIGATION  04/21/2011   Procedure: POST PARTUM TUBAL LIGATION;  Surgeon: Tereso Newcomer, MD;  Location: [facility];  Service: Gynecology;  Laterality: Bilateral;  Bilateral post partum tubal ligation with filshie clips.   tubalectomy Bilateral 2014     Allergies: Allergies as of 04/12/2022 - Review Complete 03/21/2022  Allergen Reaction Noted   Contrave [naltrexone-bupropion hcl er] Nausea Only 06/06/2017    Medications: No outpatient medications have been marked as taking for the 04/12/22 encounter (Office Visit) with Venetia Night, MD.    Social History: Social History   Tobacco Use   Smoking status: Never   Smokeless tobacco: Never  Vaping Use   Vaping Use: Never used  Substance Use Topics   Alcohol use: Yes    Alcohol/week: 12.0 standard drinks of alcohol    Types: 12 Cans of beer per week   Drug use: No    Family Medical History: Family History  Problem Relation Age of Onset   Hypothyroidism Mother    Pancreatic cancer Mother 90   Hyperthyroidism Father    Stroke Father    Hypertension Father    Hypothyroidism Father    Multiple sclerosis Paternal Aunt    Diabetes Maternal Grandmother    COPD Maternal Grandfather    Stroke Paternal Grandmother    Alzheimer's disease Paternal Grandmother    Leukemia Paternal Grandfather    Heart disease Neg Hx     Physical Examination: Vitals:   04/12/22 1144  BP: 128/82    General: Patient is well developed, well nourished, calm, collected, and in no apparent distress. Attention to examination is appropriate.  Neck:   Supple.  Full range of motion.  Respiratory: Patient is breathing without any difficulty.   NEUROLOGICAL:     Awake, alert, oriented to person, place, and time.  Speech is clear and fluent. Fund of knowledge is appropriate.   Cranial Nerves: Pupils equal round and reactive to light.  Facial tone is symmetric.  Facial sensation is symmetric. Shoulder shrug is symmetric. Tongue protrusion is midline.  There is no pronator drift.  ROM of spine: full.    Strength: Side Biceps Triceps Deltoid Interossei Grip Wrist Ext. Wrist Flex.  R 5 5 5 5 5 5 5   L 5 5 5 5 5 5 5    Side Iliopsoas Quads Hamstring PF DF EHL  R 5 5 5 5 5 5   L 5 5 4 3 5 4     Reflexes are 1+ and symmetric at the biceps, triceps, brachioradialis, patella. 1+ R and 0 L achilles.   Hoffman's is absent.  Clonus is not present.  Toes are down-going.  Bilateral upper and lower extremity sensation is intact to light touch except L S1 dermatome which is diminished.    No evidence of dysmetria noted.  Gait is abnormal - she has severe left leg pain with walking and standing..    Medical Decision Making  Imaging: MRI L spine 04/03/22 IMPRESSION: Very large extruded disc fragment on the left at L5-S1 with compression of thecal sac and left S1 nerve root. Question left laminectomy L5-S1.   Remaining disc spaces normal.     Electronically Signed   By: M.D.   On: 04/03/2022  17:05  I have personally reviewed the images and agree with the above interpretation.  Assessment and Plan: [name] is a pleasant 36 y.o. 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  This is an extremely large disc herniation which causes severe compression of the thecal sac and the left traversing S1 nerve root.  She has substantial weakness in an S1 distribution.  She is also having severe pain.  She reports some symptoms of cauda equina compression such as difficulty initiating her urine stream.  This has been relatively stable though is still symptomatic.  Her symptoms have been worsening over the last 3 to 4 weeks.  Given her objective weakness, cauda equina compression, and symptoms of urinary hesitancy, I do not feel that conservative management is indicated.  I recommended surgical intervention with a redo left-sided approach L5-S1 microdiscectomy.    I discussed the planned procedure at length with the patient, including the risks, benefits, alternatives, and indications. The risks discussed include but are not limited to bleeding, infection, need for reoperation, spinal fluid leak, stroke, vision loss, anesthetic complication, coma, paralysis,  and even death. I also described in detail that improvement was not guaranteed.  The patient expressed understanding of these risks, and asked that we proceed with surgery. I described the surgery in layman's terms, and gave ample opportunity for questions, which were answered to the best of my ability.  I spent a total of 30 minutes in face-to-face and non-face-to-face activities related to this patient's care today.  Thank you for involving me in the care of this patient.      Elizer Bostic K. Lexine Jaspers MD, MPHS Neurosurgery  

## 2022-04-12 NOTE — Telephone Encounter (Signed)
Attempted to contact pt x 2. Left message on pt's personal voicemail that we were able to move her up to a sooner surgery date of 04/17/22 instead of 04/19/22. Pre-op phone interview was moved to 04/15/22 - I also left her a message about this.

## 2022-04-12 NOTE — Progress Notes (Signed)
Referring Physician:  Dorcas Carrow, DO [address] [place],  [place] [address]  Primary Physician:  Dorcas Carrow, DO  DOS: 06/07/22 (L L5/S1 microdiscectomy)  History of Present Illness: 04/12/2022 [name] is here today with a chief complaint of worsening left leg pain and weakness.  Her leg is now giving out.  She has had symptoms now for approximately 3 to 4 months but has been rapidly worsening over the past 3 to 4 weeks.  She is now having trouble with walking and bearing weight.  She is also noticed some trouble with initiating urination.  03/21/2022 from [facility] [name] is a 36 y.o with a history of lumbar decompression who is here today with a chief complaint of numbness in buttocks and down left leg for the last 4-6 weeks with worsening. She has been seen by neurosurgery in the past for similar symptoms on both the left and right side and was primarily treated conservatively.  Today she states that her symptoms have been going on for little over a month.  She describes pain that starts in her left low back and numbness down her left posterior thigh with associated pins and needle sensation in her anterior shin and top of her left foot.  This is significantly worse with standing for prolonged periods of time and improves some with rest.  She also endorses having done stretches given to her by physical therapy at home which have not improved her symptoms and are difficult for her to do.  She feels as though she has some weakness in her left leg.  She has tried Biofreeze, Lidoderm patches, Voltaren gel, and ibuprofen without any significant relief.  She denies any similar right-sided symptoms.     Conservative measures:  Physical therapy: no recent formal PT  Multimodal medical therapy including regular antiinflammatories: as above. Injections: no recent epidural steroid injections 05/22/2020: Left S1 transforaminal ESI (no relief) 03/01/2020: Left S1  transforaminal ESI (good relief) 01/14/2018: Left S1 transforaminal ESI (good relief) 03/20/2018: Left S1 transforaminal ESI (good relief for 3 to 4 days) 03/18/2018: Left S1 transforaminal ESI (aborted due to fainting)    10/11/2021 note from Dr. Myer Haff Starting approximately 2 weeks ago, [name] began having pain down her right leg. She previously had pain down her left leg. The pain is very similar to what she had on her left leg, but on the right. She has pain in her buttock down the back of her thigh to the back of her calf. Is made worse by walking and standing. Bending forward makes it worse. Her pain is now quite severe. She denies any weakness. She has not tried any therapy at this point.   Past Surgery: Left L5-S1 microdiscectomy 06/07/20 and surgery at age 77   [name] has no symptoms of cervical myelopathy.   The symptoms are causing a significant impact on the patient's life.   Review of Systems:  A 10 point review of systems is negative, except for the pertinent positives and negatives detailed in the HPI.  Past Medical History: Past Medical History:  Diagnosis Date   Abnormal Pap smear    Anxiety    Complication of anesthesia    N/V, passing out with epidural   Headache(784.0)    Ovarian cyst    Preterm labor    Seizures (HCC)    Urinary tract infection     Past Surgical History: Past Surgical History:  Procedure Laterality  Date   LUMBAR LAMINECTOMY/DECOMPRESSION MICRODISCECTOMY Left 06/07/2020   Procedure: LEFT L5-S1 MICRODISCECTOMY;  Surgeon: Venetia Night, MD;  Location: [facility] ORS;  Service: Neurosurgery;  Laterality: Left;   repair of fr left foot     THERAPEUTIC ABORTION     TUBAL LIGATION  04/21/2011   Procedure: POST PARTUM TUBAL LIGATION;  Surgeon: Tereso Newcomer, MD;  Location: [facility];  Service: Gynecology;  Laterality: Bilateral;  Bilateral post partum tubal ligation with filshie clips.   tubalectomy Bilateral 2014     Allergies: Allergies as of 04/12/2022 - Review Complete 03/21/2022  Allergen Reaction Noted   Contrave [naltrexone-bupropion hcl er] Nausea Only 06/06/2017    Medications: No outpatient medications have been marked as taking for the 04/12/22 encounter (Office Visit) with Venetia Night, MD.    Social History: Social History   Tobacco Use   Smoking status: Never   Smokeless tobacco: Never  Vaping Use   Vaping Use: Never used  Substance Use Topics   Alcohol use: Yes    Alcohol/week: 12.0 standard drinks of alcohol    Types: 12 Cans of beer per week   Drug use: No    Family Medical History: Family History  Problem Relation Age of Onset   Hypothyroidism Mother    Pancreatic cancer Mother 90   Hyperthyroidism Father    Stroke Father    Hypertension Father    Hypothyroidism Father    Multiple sclerosis Paternal Aunt    Diabetes Maternal Grandmother    COPD Maternal Grandfather    Stroke Paternal Grandmother    Alzheimer's disease Paternal Grandmother    Leukemia Paternal Grandfather    Heart disease Neg Hx     Physical Examination: Vitals:   04/12/22 1144  BP: 128/82    General: Patient is well developed, well nourished, calm, collected, and in no apparent distress. Attention to examination is appropriate.  Neck:   Supple.  Full range of motion.  Respiratory: Patient is breathing without any difficulty.   NEUROLOGICAL:     Awake, alert, oriented to person, place, and time.  Speech is clear and fluent. Fund of knowledge is appropriate.   Cranial Nerves: Pupils equal round and reactive to light.  Facial tone is symmetric.  Facial sensation is symmetric. Shoulder shrug is symmetric. Tongue protrusion is midline.  There is no pronator drift.  ROM of spine: full.    Strength: Side Biceps Triceps Deltoid Interossei Grip Wrist Ext. Wrist Flex.  R 5 5 5 5 5 5 5   L 5 5 5 5 5 5 5    Side Iliopsoas Quads Hamstring PF DF EHL  R 5 5 5 5 5 5   L 5 5 4 3 5 4     Reflexes are 1+ and symmetric at the biceps, triceps, brachioradialis, patella. 1+ R and 0 L achilles.   Hoffman's is absent.  Clonus is not present.  Toes are down-going.  Bilateral upper and lower extremity sensation is intact to light touch except L S1 dermatome which is diminished.    No evidence of dysmetria noted.  Gait is abnormal - she has severe left leg pain with walking and standing..    Medical Decision Making  Imaging: MRI L spine 04/03/22 IMPRESSION: Very large extruded disc fragment on the left at L5-S1 with compression of thecal sac and left S1 nerve root. Question left laminectomy L5-S1.   Remaining disc spaces normal.     Electronically Signed   By: M.D.   On: 04/03/2022  17:05  I have personally reviewed the images and agree with the above interpretation.  Assessment and Plan: Ms. Pendergraft is a pleasant 36 y.o. 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  This is an extremely large disc herniation which causes severe compression of the thecal sac and the left traversing S1 nerve root.  She has substantial weakness in an S1 distribution.  She is also having severe pain.  She reports some symptoms of cauda equina compression such as difficulty initiating her urine stream.  This has been relatively stable though is still symptomatic.  Her symptoms have been worsening over the last 3 to 4 weeks.  Given her objective weakness, cauda equina compression, and symptoms of urinary hesitancy, I do not feel that conservative management is indicated.  I recommended surgical intervention with a redo left-sided approach L5-S1 microdiscectomy.    I discussed the planned procedure at length with the patient, including the risks, benefits, alternatives, and indications. The risks discussed include but are not limited to bleeding, infection, need for reoperation, spinal fluid leak, stroke, vision loss, anesthetic complication, coma, paralysis,  and even death. I also described in detail that improvement was not guaranteed.  The patient expressed understanding of these risks, and asked that we proceed with surgery. I described the surgery in layman's terms, and gave ample opportunity for questions, which were answered to the best of my ability.  I spent a total of 30 minutes in face-to-face and non-face-to-face activities related to this patient's care today.  Thank you for involving me in the care of this patient.      Leighla Chestnutt K. Myer Haff MD, [facility] Neurosurgery

## 2022-04-12 NOTE — Telephone Encounter (Signed)
Pt called back and confirmed

## 2022-04-12 NOTE — Addendum Note (Signed)
Addended by: Venetia Night on: 04/12/2022 12:25 PM   Modules accepted: Orders

## 2022-04-15 ENCOUNTER — Encounter
Admission: RE | Admit: 2022-04-15 | Discharge: 2022-04-15 | Disposition: A | Discharge status: Home / Self Care | Payer: Managed Care, Other (non HMO) | Source: Ambulatory Visit | Attending: Neurosurgery | Admitting: Neurosurgery

## 2022-04-15 VITALS — Ht 68.0 in | Wt 193.0 lb

## 2022-04-15 DIAGNOSIS — Z01818 Encounter for other preprocedural examination: Secondary | ICD-10-CM

## 2022-04-15 NOTE — Patient Instructions (Signed)
Your procedure is scheduled on: 04/17/22 Report to DAY SURGERY DEPARTMENT LOCATED ON 2ND FLOOR MEDICAL MALL ENTRANCE. To find out your arrival time please call [phone] between 1PM - 3PM on 04/16/22.  Remember: Instructions that are not followed completely may result in serious medical risk, up to and including death, or upon the discretion of your surgeon and anesthesiologist your surgery may need to be rescheduled.     _X__ 1. Do not eat food after midnight the night before your procedure.                 No gum chewing or hard candies. You may drink clear liquids up to 2 hours                 before you are scheduled to arrive for your surgery- DO not drink clear                 liquids within 2 hours of the start of your surgery.                 Clear Liquids include:  water, apple juice without pulp, clear carbohydrate                 drink such as Clearfast or Gatorade, Black Coffee or Tea (Do not add                 anything to coffee or tea). Diabetics water only  __X__2.  On the morning of surgery brush your teeth with toothpaste and water, you                 may rinse your mouth with mouthwash if you wish.  Do not swallow any              toothpaste of mouthwash.     _X__ 3.  No Alcohol for 24 hours before or after surgery.   _X__ 4.  Do Not Smoke or use e-cigarettes For 24 Hours Prior to Your Surgery.                 Do not use any chewable tobacco products for at least 6 hours prior to                 surgery.  ____  5.  Bring all medications with you on the day of surgery if instructed.   __X__  6.  Notify your doctor if there is any change in your medical condition      (cold, fever, infections).     Do not wear jewelry, make-up, hairpins, clips or nail polish. Do not wear lotions, powders, or perfumes.  Do not shave body hair 48 hours prior to surgery. Men may shave face and neck. Do not bring valuables to the hospital.    [facility] is not responsible for any  belongings or valuables.  Contacts, dentures/partials or body piercings may not be worn into surgery. Bring a case for your contacts, glasses or hearing aids, a denture cup will be supplied. Leave your suitcase in the car. After surgery it may be brought to your room. For patients admitted to the hospital, discharge time is determined by your treatment team.   Patients discharged the day of surgery will not be allowed to drive home.     __X__ Take these medicines the morning of surgery with A SIP OF WATER:    1. gabapentin (NEURONTIN) 300 MG capsule  2.   3.  4.  5.  6.  ____ Fleet Enema (as directed)   ____ Use CHG Soap/SAGE wipes as directed  ____ Use inhalers on the day of surgery  ____ Stop metformin/Janumet/Farxiga 2 days prior to surgery    ____ Take 1/2 of usual insulin dose the night before surgery. No insulin the morning          of surgery.   ____ Stop Blood Thinners Coumadin/Plavix/Xarelto/Pleta/Pradaxa/Eliquis/Effient/Aspirin  on   Or contact your Surgeon, Cardiologist or Medical Doctor regarding  ability to stop your blood thinners  __X__ Stop Anti-inflammatories 7 days before surgery such as Advil, Ibuprofen, Motrin,  BC or Goodies Powder, Naprosyn, Naproxen, Aleve, Aspirin    __X__ Stop all herbals and supplements, fish oil or vitamins  until after surgery.    ____ Bring C-Pap to the hospital.

## 2022-04-16 MED ORDER — FAMOTIDINE 20 MG PO TABS: 20.0000 mg | ORAL_TABLET | Freq: Once | ORAL | Status: AC

## 2022-04-16 MED ORDER — CEFAZOLIN SODIUM-DEXTROSE 2-4 GM/100ML-% IV SOLN
2.0000 g | Freq: Once | INTRAVENOUS | Status: AC
  Administered 2022-04-17: 2 g via INTRAVENOUS
  Filled 2022-04-16: qty 100

## 2022-04-16 MED ORDER — ORAL CARE MOUTH RINSE: 15.0000 mL | Freq: Once | OROMUCOSAL | Status: AC

## 2022-04-16 MED ORDER — CHLORHEXIDINE GLUCONATE 0.12 % MT SOLN: 15.0000 mL | Freq: Once | OROMUCOSAL | Status: AC

## 2022-04-16 MED ORDER — LACTATED RINGERS IV SOLN: INTRAVENOUS | Status: DC

## 2022-04-17 ENCOUNTER — Ambulatory Visit: Payer: Managed Care, Other (non HMO) | Admitting: Urgent Care

## 2022-04-17 ENCOUNTER — Other Ambulatory Visit: Payer: Self-pay

## 2022-04-17 ENCOUNTER — Encounter: Payer: Self-pay | Admitting: Neurosurgery

## 2022-04-17 ENCOUNTER — Encounter
Admission: AD | Disposition: A | Discharge status: Home / Self Care | Payer: Self-pay | Source: Home / Self Care | Attending: Neurosurgery

## 2022-04-17 ENCOUNTER — Ambulatory Visit: Payer: Managed Care, Other (non HMO)

## 2022-04-17 ENCOUNTER — Inpatient Hospital Stay
Admission: AD | Admit: 2022-04-17 | Discharge: 2022-04-19 | DRG: 030 | Disposition: A | Discharge status: Home / Self Care | Payer: Managed Care, Other (non HMO) | Attending: Neurosurgery | Admitting: Neurosurgery

## 2022-04-17 DIAGNOSIS — M5416 Radiculopathy, lumbar region: Secondary | ICD-10-CM

## 2022-04-17 DIAGNOSIS — Z01810 Encounter for preprocedural cardiovascular examination: Principal | ICD-10-CM

## 2022-04-17 DIAGNOSIS — F419 Anxiety disorder, unspecified: Secondary | ICD-10-CM | POA: Diagnosis present

## 2022-04-17 DIAGNOSIS — Z888 Allergy status to other drugs, medicaments and biological substances status: Secondary | ICD-10-CM

## 2022-04-17 DIAGNOSIS — N9989 Other postprocedural complications and disorders of genitourinary system: Secondary | ICD-10-CM | POA: Diagnosis not present

## 2022-04-17 DIAGNOSIS — E669 Obesity, unspecified: Secondary | ICD-10-CM

## 2022-04-17 DIAGNOSIS — R339 Retention of urine, unspecified: Secondary | ICD-10-CM | POA: Diagnosis not present

## 2022-04-17 DIAGNOSIS — N920 Excessive and frequent menstruation with regular cycle: Secondary | ICD-10-CM

## 2022-04-17 DIAGNOSIS — M5117 Intervertebral disc disorders with radiculopathy, lumbosacral region: Secondary | ICD-10-CM | POA: Diagnosis present

## 2022-04-17 DIAGNOSIS — Z01812 Encounter for preprocedural laboratory examination: Secondary | ICD-10-CM

## 2022-04-17 DIAGNOSIS — Z01818 Encounter for other preprocedural examination: Secondary | ICD-10-CM

## 2022-04-17 DIAGNOSIS — G834 Cauda equina syndrome: Secondary | ICD-10-CM | POA: Diagnosis not present

## 2022-04-17 DIAGNOSIS — R29898 Other symptoms and signs involving the musculoskeletal system: Secondary | ICD-10-CM | POA: Diagnosis not present

## 2022-04-17 HISTORY — DX: Insomnia, unspecified: G47.00

## 2022-04-17 HISTORY — DX: Radiculopathy, lumbar region: M54.16

## 2022-04-17 HISTORY — PX: LUMBAR LAMINECTOMY/DECOMPRESSION MICRODISCECTOMY: SHX5026

## 2022-04-17 HISTORY — DX: Low grade squamous intraepithelial lesion on cytologic smear of cervix (LGSIL): R87.612

## 2022-04-17 LAB — POCT PREGNANCY, URINE: Preg Test, Ur: NEGATIVE

## 2022-04-17 SURGERY — LUMBAR LAMINECTOMY/DECOMPRESSION MICRODISCECTOMY 1 LEVEL
Anesthesia: General | Site: Back | Laterality: Left

## 2022-04-17 MED ORDER — POTASSIUM CHLORIDE IN NACL 20-0.9 MEQ/L-% IV SOLN
INTRAVENOUS | Status: DC
  Filled 2022-04-17 (×4): qty 1000

## 2022-04-17 MED ORDER — OXYCODONE HCL 5 MG/5ML PO SOLN: 5.0000 mg | Freq: Once | ORAL | Status: AC | PRN

## 2022-04-17 MED ORDER — KETOROLAC TROMETHAMINE 15 MG/ML IJ SOLN
15.0000 mg | Freq: Four times a day (QID) | INTRAMUSCULAR | Status: AC
  Administered 2022-04-18 (×2): 15 mg via INTRAVENOUS
  Filled 2022-04-17 (×2): qty 1

## 2022-04-17 MED ORDER — BUPIVACAINE-EPINEPHRINE (PF) 0.5% -1:200000 IJ SOLN
INTRAMUSCULAR | Status: DC | PRN
  Administered 2022-04-17: 7 mL via PERINEURAL

## 2022-04-17 MED ORDER — ONDANSETRON HCL 4 MG/2ML IJ SOLN: 4.0000 mg | Freq: Four times a day (QID) | INTRAMUSCULAR | Status: DC | PRN

## 2022-04-17 MED ORDER — DEXAMETHASONE SODIUM PHOSPHATE 4 MG/ML IJ SOLN
INTRAMUSCULAR | Status: AC
  Administered 2022-04-17: 10 mg via INTRAVENOUS
  Filled 2022-04-17: qty 3

## 2022-04-17 MED ORDER — SODIUM CHLORIDE FLUSH 0.9 % IV SOLN
INTRAVENOUS | Status: AC
  Filled 2022-04-17: qty 10

## 2022-04-17 MED ORDER — KETOROLAC TROMETHAMINE 30 MG/ML IJ SOLN
INTRAMUSCULAR | Status: AC
  Administered 2022-04-17: 30 mg
  Filled 2022-04-17: qty 1

## 2022-04-17 MED ORDER — ACETAMINOPHEN 325 MG PO TABS: 650.0000 mg | ORAL_TABLET | ORAL | Status: DC | PRN

## 2022-04-17 MED ORDER — BUPIVACAINE LIPOSOME 1.3 % IJ SUSP
INTRAMUSCULAR | Status: AC
  Filled 2022-04-17: qty 20

## 2022-04-17 MED ORDER — REMIFENTANIL HCL 1 MG IV SOLR
INTRAVENOUS | Status: AC
  Filled 2022-04-17: qty 1000

## 2022-04-17 MED ORDER — METHOCARBAMOL 750 MG PO TABS
ORAL_TABLET | ORAL | Status: AC
  Administered 2022-04-17: 750 mg via ORAL
  Filled 2022-04-17: qty 1

## 2022-04-17 MED ORDER — SODIUM CHLORIDE 0.9% FLUSH: 3.0000 mL | INTRAVENOUS | Status: DC | PRN

## 2022-04-17 MED ORDER — OXYCODONE HCL 5 MG PO TABS
5.0000 mg | ORAL_TABLET | Freq: Once | ORAL | Status: AC | PRN
  Administered 2022-04-17: 5 mg via ORAL

## 2022-04-17 MED ORDER — FAMOTIDINE 20 MG PO TABS
ORAL_TABLET | ORAL | Status: AC
  Administered 2022-04-17: 20 mg via ORAL
  Filled 2022-04-17: qty 1

## 2022-04-17 MED ORDER — LIDOCAINE HCL (CARDIAC) PF 100 MG/5ML IV SOSY
PREFILLED_SYRINGE | INTRAVENOUS | Status: DC | PRN
  Administered 2022-04-17: 60 mg via INTRAVENOUS

## 2022-04-17 MED ORDER — KETOROLAC TROMETHAMINE 30 MG/ML IJ SOLN
30.0000 mg | Freq: Once | INTRAMUSCULAR | Status: AC
Start: 2022-04-17 — End: 2022-04-17

## 2022-04-17 MED ORDER — DEXAMETHASONE SODIUM PHOSPHATE 10 MG/ML IJ SOLN
INTRAMUSCULAR | Status: DC | PRN
  Administered 2022-04-17: 10 mg via INTRAVENOUS

## 2022-04-17 MED ORDER — METHOCARBAMOL 1000 MG/10ML IJ SOLN: 500.0000 mg | Freq: Four times a day (QID) | INTRAVENOUS | Status: DC | PRN

## 2022-04-17 MED ORDER — ACETAMINOPHEN 10 MG/ML IV SOLN
INTRAVENOUS | Status: AC
Start: 2022-04-17 — End: ?
  Filled 2022-04-17: qty 100

## 2022-04-17 MED ORDER — OXYCODONE HCL 5 MG PO TABS
5.0000 mg | ORAL_TABLET | ORAL | Status: DC | PRN
  Administered 2022-04-18 – 2022-04-19 (×5): 5 mg via ORAL
  Filled 2022-04-17 (×5): qty 1

## 2022-04-17 MED ORDER — FENTANYL CITRATE (PF) 100 MCG/2ML IJ SOLN
INTRAMUSCULAR | Status: AC
  Filled 2022-04-17: qty 2

## 2022-04-17 MED ORDER — MIDAZOLAM HCL 2 MG/2ML IJ SOLN
INTRAMUSCULAR | Status: DC | PRN
  Administered 2022-04-17: 2 mg via INTRAVENOUS

## 2022-04-17 MED ORDER — METHYLPREDNISOLONE ACETATE 40 MG/ML IJ SUSP
INTRAMUSCULAR | Status: DC | PRN
  Administered 2022-04-17: 40 mg

## 2022-04-17 MED ORDER — BUPIVACAINE HCL (PF) 0.5 % IJ SOLN
INTRAMUSCULAR | Status: AC
  Filled 2022-04-17: qty 30

## 2022-04-17 MED ORDER — METHYLPREDNISOLONE ACETATE 40 MG/ML IJ SUSP
INTRAMUSCULAR | Status: AC
  Filled 2022-04-17: qty 1

## 2022-04-17 MED ORDER — PROPOFOL 10 MG/ML IV BOLUS
INTRAVENOUS | Status: DC | PRN
  Administered 2022-04-17: 150 mg via INTRAVENOUS
  Administered 2022-04-17: 50 mg via INTRAVENOUS

## 2022-04-17 MED ORDER — SUCCINYLCHOLINE CHLORIDE 200 MG/10ML IV SOSY
PREFILLED_SYRINGE | INTRAVENOUS | Status: DC | PRN
  Administered 2022-04-17: 100 mg via INTRAVENOUS

## 2022-04-17 MED ORDER — DOCUSATE SODIUM 100 MG PO CAPS
100.0000 mg | ORAL_CAPSULE | Freq: Two times a day (BID) | ORAL | Status: DC
  Administered 2022-04-17 – 2022-04-19 (×4): 100 mg via ORAL
  Filled 2022-04-17 (×4): qty 1

## 2022-04-17 MED ORDER — OXYCODONE HCL 5 MG PO TABS
ORAL_TABLET | ORAL | Status: AC
  Administered 2022-04-19: 5 mg via ORAL
  Filled 2022-04-17: qty 1

## 2022-04-17 MED ORDER — GLYCOPYRROLATE 0.2 MG/ML IJ SOLN
INTRAMUSCULAR | Status: DC | PRN
  Administered 2022-04-17: .2 mg via INTRAVENOUS

## 2022-04-17 MED ORDER — SODIUM CHLORIDE 0.9% FLUSH
3.0000 mL | Freq: Two times a day (BID) | INTRAVENOUS | Status: DC
  Administered 2022-04-17 – 2022-04-18 (×2): 3 mL via INTRAVENOUS

## 2022-04-17 MED ORDER — MIDAZOLAM HCL 2 MG/2ML IJ SOLN
INTRAMUSCULAR | Status: AC
  Filled 2022-04-17: qty 2

## 2022-04-17 MED ORDER — ACETAMINOPHEN 10 MG/ML IV SOLN
INTRAVENOUS | Status: DC | PRN
  Administered 2022-04-17: 1000 mg via INTRAVENOUS

## 2022-04-17 MED ORDER — ONDANSETRON HCL 4 MG PO TABS
4.0000 mg | ORAL_TABLET | Freq: Four times a day (QID) | ORAL | Status: DC | PRN
Start: 2022-04-17 — End: 2022-04-19

## 2022-04-17 MED ORDER — CHLORHEXIDINE GLUCONATE 0.12 % MT SOLN
OROMUCOSAL | Status: AC
  Administered 2022-04-17: 15 mL via OROMUCOSAL
  Filled 2022-04-17: qty 15

## 2022-04-17 MED ORDER — OXYCODONE HCL 5 MG PO TABS
10.0000 mg | ORAL_TABLET | ORAL | Status: DC | PRN
  Administered 2022-04-18 (×2): 10 mg via ORAL
  Filled 2022-04-17 (×2): qty 2

## 2022-04-17 MED ORDER — OXYCODONE-ACETAMINOPHEN 5-325 MG PO TABS: 1.0000 | ORAL_TABLET | ORAL | 0 refills | Status: AC | PRN

## 2022-04-17 MED ORDER — DEXMEDETOMIDINE (PRECEDEX) IN NS 20 MCG/5ML (4 MCG/ML) IV SYRINGE
PREFILLED_SYRINGE | INTRAVENOUS | Status: DC | PRN
  Administered 2022-04-17: 8 ug via INTRAVENOUS
  Administered 2022-04-17: 4 ug via INTRAVENOUS
  Administered 2022-04-17: 8 ug via INTRAVENOUS

## 2022-04-17 MED ORDER — BUPIVACAINE-EPINEPHRINE (PF) 0.5% -1:200000 IJ SOLN
INTRAMUSCULAR | Status: AC
  Filled 2022-04-17: qty 30

## 2022-04-17 MED ORDER — MENTHOL 3 MG MT LOZG: 1.0000 | LOZENGE | OROMUCOSAL | Status: DC | PRN

## 2022-04-17 MED ORDER — SODIUM CHLORIDE 0.9 % IV SOLN
INTRAVENOUS | Status: DC | PRN
  Administered 2022-04-17: 40 mL

## 2022-04-17 MED ORDER — ACETAMINOPHEN 10 MG/ML IV SOLN
INTRAVENOUS | Status: AC
  Filled 2022-04-17: qty 100

## 2022-04-17 MED ORDER — SODIUM CHLORIDE FLUSH 0.9 % IV SOLN
INTRAVENOUS | Status: AC
  Filled 2022-04-17: qty 20

## 2022-04-17 MED ORDER — FENTANYL CITRATE (PF) 100 MCG/2ML IJ SOLN
25.0000 ug | INTRAMUSCULAR | Status: DC | PRN
  Administered 2022-04-17: 50 ug via INTRAVENOUS
  Administered 2022-04-17: 25 ug via INTRAVENOUS

## 2022-04-17 MED ORDER — GABAPENTIN 300 MG PO CAPS
300.0000 mg | ORAL_CAPSULE | Freq: Three times a day (TID) | ORAL | Status: DC
  Administered 2022-04-17 – 2022-04-19 (×5): 300 mg via ORAL
  Filled 2022-04-17 (×5): qty 1

## 2022-04-17 MED ORDER — FENTANYL CITRATE (PF) 100 MCG/2ML IJ SOLN
INTRAMUSCULAR | Status: AC
  Administered 2022-04-17: 25 ug via INTRAVENOUS
  Filled 2022-04-17: qty 2

## 2022-04-17 MED ORDER — SODIUM CHLORIDE 0.9 % IV SOLN: 250.0000 mL | INTRAVENOUS | Status: DC

## 2022-04-17 MED ORDER — ONDANSETRON HCL 4 MG/2ML IJ SOLN
INTRAMUSCULAR | Status: DC | PRN
  Administered 2022-04-17: 4 mg via INTRAVENOUS

## 2022-04-17 MED ORDER — METHOCARBAMOL 750 MG PO TABS: 750.0000 mg | ORAL_TABLET | Freq: Four times a day (QID) | ORAL | 0 refills | Status: DC | PRN

## 2022-04-17 MED ORDER — BISACODYL 5 MG PO TBEC: 5.0000 mg | DELAYED_RELEASE_TABLET | Freq: Every day | ORAL | Status: DC | PRN

## 2022-04-17 MED ORDER — ACETAMINOPHEN 650 MG RE SUPP: 650.0000 mg | RECTAL | Status: DC | PRN

## 2022-04-17 MED ORDER — DEXAMETHASONE SODIUM PHOSPHATE 10 MG/ML IJ SOLN
10.0000 mg | Freq: Once | INTRAMUSCULAR | Status: AC
Start: 2022-04-17 — End: 2022-04-17
  Administered 2022-04-17: 10 mg via INTRAVENOUS

## 2022-04-17 MED ORDER — REMIFENTANIL HCL 1 MG IV SOLR
INTRAVENOUS | Status: DC | PRN
  Administered 2022-04-17: .04 ug/kg/min via INTRAVENOUS

## 2022-04-17 MED ORDER — FENTANYL CITRATE (PF) 100 MCG/2ML IJ SOLN
INTRAMUSCULAR | Status: DC | PRN
Start: 2022-04-17 — End: 2022-04-17
  Administered 2022-04-17 (×4): 50 ug via INTRAVENOUS

## 2022-04-17 MED ORDER — METHOCARBAMOL 500 MG PO TABS: 500.0000 mg | ORAL_TABLET | Freq: Four times a day (QID) | ORAL | Status: DC | PRN

## 2022-04-17 MED ORDER — CEFAZOLIN SODIUM-DEXTROSE 2-4 GM/100ML-% IV SOLN
INTRAVENOUS | Status: AC
  Filled 2022-04-17: qty 100

## 2022-04-17 MED ORDER — METHOCARBAMOL 750 MG PO TABS: 750.0000 mg | ORAL_TABLET | Freq: Once | ORAL | Status: AC

## 2022-04-17 MED ORDER — BUPIVACAINE HCL 0.5 % IJ SOLN
INTRAMUSCULAR | Status: DC | PRN
  Administered 2022-04-17: 20 mL

## 2022-04-17 MED ORDER — PHENOL 1.4 % MT LIQD: 1.0000 | OROMUCOSAL | Status: DC | PRN

## 2022-04-17 SURGICAL SUPPLY — 49 items
BASIN KIT SINGLE STR (MISCELLANEOUS) ×2 IMPLANT
BUR NEURO DRILL SOFT 3.0X3.8M (BURR) ×2 IMPLANT
CHLORAPREP W/TINT 26 (MISCELLANEOUS) ×2 IMPLANT
CNTNR SPEC 2.5X3XGRAD LEK (MISCELLANEOUS) ×1
CONT SPEC 4OZ STER OR WHT (MISCELLANEOUS) ×2
CONT SPEC 4OZ STRL OR WHT (MISCELLANEOUS) ×1
CONTAINER SPEC 2.5X3XGRAD LEK (MISCELLANEOUS) ×1 IMPLANT
DERMABOND ADVANCED (GAUZE/BANDAGES/DRESSINGS) ×2
DERMABOND ADVANCED .7 DNX12 (GAUZE/BANDAGES/DRESSINGS) ×1 IMPLANT
DRAPE C ARM PK CFD 31 SPINE (DRAPES) ×2 IMPLANT
DRAPE LAPAROTOMY 100X77 ABD (DRAPES) ×2 IMPLANT
DRAPE MICROSCOPE SPINE 48X150 (DRAPES) ×2 IMPLANT
DRAPE SURG 17X11 SM STRL (DRAPES) ×2 IMPLANT
DRSG OPSITE POSTOP 3X4 (GAUZE/BANDAGES/DRESSINGS) ×1 IMPLANT
ELECT EZSTD 165MM 6.5IN (MISCELLANEOUS)
ELECT REM PT RETURN 9FT ADLT (ELECTROSURGICAL) ×2
ELECTRODE EZSTD 165MM 6.5IN (MISCELLANEOUS) IMPLANT
ELECTRODE REM PT RTRN 9FT ADLT (ELECTROSURGICAL) ×1 IMPLANT
GLOVE BIOGEL PI IND STRL 6.5 (GLOVE) ×1 IMPLANT
GLOVE BIOGEL PI IND STRL 8.5 (GLOVE) ×1 IMPLANT
GLOVE BIOGEL PI INDICATOR 6.5 (GLOVE) ×2
GLOVE BIOGEL PI INDICATOR 8.5 (GLOVE) ×2
GLOVE SURG SYN 6.5 ES PF (GLOVE) ×4 IMPLANT
GLOVE SURG SYN 6.5 PF PI (GLOVE) ×2 IMPLANT
GLOVE SURG SYN 8.5  E (GLOVE) ×6
GLOVE SURG SYN 8.5 E (GLOVE) ×3 IMPLANT
GLOVE SURG SYN 8.5 PF PI (GLOVE) ×3 IMPLANT
GOWN SRG LRG LVL 4 IMPRV REINF (GOWNS) ×1 IMPLANT
GOWN SRG XL LVL 3 NONREINFORCE (GOWNS) ×1 IMPLANT
GOWN STRL NON-REIN TWL XL LVL3 (GOWNS) ×2
GOWN STRL REIN LRG LVL4 (GOWNS) ×2
KIT SPINAL PRONEVIEW (KITS) ×2 IMPLANT
MANIFOLD NEPTUNE II (INSTRUMENTS) ×2 IMPLANT
MARKER SKIN DUAL TIP RULER LAB (MISCELLANEOUS) ×2 IMPLANT
NDL SAFETY ECLIPSE 18X1.5 (NEEDLE) ×1 IMPLANT
NEEDLE HYPO 18GX1.5 SHARP (NEEDLE) ×2
NS IRRIG 1000ML POUR BTL (IV SOLUTION) ×2 IMPLANT
PACK LAMINECTOMY NEURO (CUSTOM PROCEDURE TRAY) ×2 IMPLANT
PAD ARMBOARD 7.5X6 YLW CONV (MISCELLANEOUS) ×2 IMPLANT
SURGIFLO W/THROMBIN 8M KIT (HEMOSTASIS) ×2 IMPLANT
SUT DVC VLOC 3-0 CL 6 P-12 (SUTURE) ×2 IMPLANT
SUT VIC AB 0 CT1 27 (SUTURE) ×2
SUT VIC AB 0 CT1 27XCR 8 STRN (SUTURE) ×1 IMPLANT
SUT VIC AB 2-0 CT1 18 (SUTURE) ×2 IMPLANT
SYR 10ML LL (SYRINGE) ×4 IMPLANT
SYR 30ML LL (SYRINGE) ×4 IMPLANT
SYR 3ML LL SCALE MARK (SYRINGE) ×2 IMPLANT
TRAP FLUID SMOKE EVACUATOR (MISCELLANEOUS) ×2 IMPLANT
WATER STERILE IRR 1000ML POUR (IV SOLUTION) ×4 IMPLANT

## 2022-04-17 NOTE — Interval H&P Note (Signed)
History and Physical Interval Note:  04/17/2022 4:20 PM  [name]  has presented today for surgery, with the diagnosis of Lumbar radiculopathy M54.16 Cauda equina compression G83.4 Left leg weakness R29.898.  The various methods of treatment have been discussed with the patient and family. After consideration of risks, benefits and other options for treatment, the patient has consented to  Procedure(s): LEFT L5-S1 MICRODISCECTOMY (Left) as a surgical intervention.  The patient's history has been reviewed, patient examined, no change in status, stable for surgery.  I have reviewed the patient's chart and labs.  Questions were answered to the patient's satisfaction.    Heart sounds normal no MRG. Chest Clear to Auscultation Bilaterally.   Blaike Vickers

## 2022-04-17 NOTE — Anesthesia Preprocedure Evaluation (Signed)
Anesthesia Evaluation  Patient identified by MRN, date of birth, ID band Patient awake    Reviewed: Allergy & Precautions, NPO status , Patient's Chart, lab work & pertinent test results  Airway Mallampati: II  TM Distance: >3 FB Neck ROM: full    Dental  (+) Teeth Intact   Pulmonary neg pulmonary ROS, neg shortness of breath, neg sleep apnea, neg COPD, neg recent URI,    Pulmonary exam normal        Cardiovascular (-) Past MI and (-) CABG negative cardio ROS Normal cardiovascular exam     Neuro/Psych PSYCHIATRIC DISORDERS negative neurological ROS     GI/Hepatic negative GI ROS, Neg liver ROS,   Endo/Other  negative endocrine ROS  Renal/GU      Musculoskeletal   Abdominal   Peds  Hematology negative hematology ROS (+)   Anesthesia Other Findings Past Medical History: No date: Anxiety No date: Complication of anesthesia     Comment:  N/V, passing out with epidural No date: Headache(784.0) No date: Insomnia No date: Lumbar radiculopathy No date: Ovarian cyst No date: Pap smear abnormality of cervix with LGSIL No date: Preterm labor No date: Seizures (HCC) No date: Urinary tract infection  Past Surgical History: No date: CYST EXCISION     Comment:  lower back 06/07/2020: LUMBAR LAMINECTOMY/DECOMPRESSION MICRODISCECTOMY; Left     Comment:  Procedure: LEFT L5-S1 MICRODISCECTOMY;  Surgeon:               Venetia Night, MD;  Location: [facility] ORS;  Service:               Neurosurgery;  Laterality: Left; No date: repair of fr left foot     Comment:  fracture No date: THERAPEUTIC ABORTION 04/21/2011: TUBAL LIGATION     Comment:  Procedure: POST PARTUM TUBAL LIGATION;  Surgeon: Tereso Newcomer, MD;  Location: [facility] ORS;  Service: Gynecology;                Laterality: Bilateral;  Bilateral post partum tubal               ligation with filshie clips. 2014: tubalectomy; Bilateral  BMI    Body  Mass Index: 29.33 kg/m      Reproductive/Obstetrics negative OB ROS                             Anesthesia Physical Anesthesia Plan  ASA: 2  Anesthesia Plan: General ETT   Post-op Pain Management:    Induction: Intravenous  PONV Risk Score and Plan: 3 and Dexamethasone, Ondansetron, Propofol infusion and TIVA  Airway Management Planned: Oral ETT  Additional Equipment:   Intra-op Plan:   Post-operative Plan: Extubation in OR  Informed Consent: I have reviewed the patients History and Physical, chart, labs and discussed the procedure including the risks, benefits and alternatives for the proposed anesthesia with the patient or authorized representative who has indicated his/her understanding and acceptance.     Dental Advisory Given  Plan Discussed with: Anesthesiologist, CRNA and Surgeon  Anesthesia Plan Comments: (Patient consented for risks of anesthesia including but not limited to:  - adverse reactions to medications - damage to eyes, teeth, lips or other oral mucosa - nerve damage due to positioning  - sore throat or hoarseness - Damage to heart, brain, nerves, lungs, other parts of body or loss of life  Patient voiced understanding.)        Anesthesia Quick Evaluation

## 2022-04-17 NOTE — Progress Notes (Signed)
Per Dr. Myer Haff will admit overnight for observation. Patient and husband aware, verbalize understanding.

## 2022-04-17 NOTE — Transfer of Care (Signed)
Immediate Anesthesia Transfer of Care Note  Patient: [name]  Procedure(s) Performed: LEFT L5-S1 MICRODISCECTOMY (Left: Back)  Patient Location: PACU  Anesthesia Type:General  Level of Consciousness: awake, alert  and oriented  Airway & Oxygen Therapy: Patient Spontanous Breathing and Patient connected to face mask oxygen  Post-op Assessment: Report given to RN and Post -op Vital signs reviewed and stable  Post vital signs: Reviewed and stable  Last Vitals:  Vitals Value Taken Time  BP 106/66 04/17/22 1905  Temp    Pulse 108 04/17/22 1908  Resp 16 04/17/22 1908  SpO2 100 % 04/17/22 1908  Vitals shown include unvalidated device data.  Last Pain:  Vitals:   04/17/22 1422  TempSrc: Temporal  PainSc: 7          Complications: No notable events documented.

## 2022-04-17 NOTE — Progress Notes (Signed)
Patient awake/alert x4. Dr. Myer Haff in to exam patient, will admit overnight for observation. Patient able to move bil lower ext, bend knee's, push/pull, wiggle bil toes but continues to state no sensation. Does verbalize "slight" sensation upper thighs. Vitals stable, afebrile, dressing to lumbar area c/d/I  No hematoma noted. Family aware/updated.

## 2022-04-17 NOTE — Discharge Instructions (Addendum)
Your surgeon has performed an operation on your lumbar spine (low back) to relieve pressure on one or more nerves. Many times, patients feel better immediately after surgery and can "overdo it." Even if you feel well, it is important that you follow these activity guidelines. If you do not let your back heal properly from the surgery, you can increase the chance of a disc herniation and/or return of your symptoms. The following are instructions to help in your recovery once you have been discharged from the hospital.  * It is ok to take NSAIDs after surgery.  Activity    No bending, lifting, or twisting ("BLT"). Avoid lifting objects heavier than 10 pounds (gallon milk jug).  Where possible, avoid household activities that involve lifting, bending, pushing, or pulling such as laundry, vacuuming, grocery shopping, and childcare. Try to arrange for help from friends and family for these activities while your back heals.  Increase physical activity slowly as tolerated.  Taking short walks is encouraged, but avoid strenuous exercise. Do not jog, run, bicycle, lift weights, or participate in any other exercises unless specifically allowed by your doctor. Avoid prolonged sitting, including car rides.  Talk to your doctor before resuming sexual activity.  You should not drive until cleared by your doctor.  Until released by your doctor, you should not return to work or school.  You should rest at home and let your body heal.   You may shower two days after your surgery.  After showering, lightly dab your incision dry. Do not take a tub bath or go swimming for 3 weeks, or until approved by your doctor at your follow-up appointment.  If you smoke, we strongly recommend that you quit.  Smoking has been proven to interfere with normal healing in your back and will dramatically reduce the success rate of your surgery. Please contact [facility] (800-QUIT-NOW) and use the resources at www.[facility].com for  assistance in stopping smoking.  Surgical Incision   If you have a dressing on your incision, you may remove it three days after your surgery. Keep your incision area clean and dry.  Your incision was closed with Dermabond glue. The glue should begin to peel away within about a week.  Diet            You may return to your usual diet. Be sure to stay hydrated.  When to Contact us  Although your surgery and recovery will likely be uneventful, you may have some residual numbness, aches, and pains in your back and/or legs. This is normal and should improve in the next few weeks.  However, should you experience any of the following, contact us immediately: New numbness or weakness Pain that is progressively getting worse, and is not relieved by your pain medications or rest Bleeding, redness, swelling, pain, or drainage from surgical incision Chills or flu-like symptoms Fever greater than 101.0 F (38.3 C) Problems with bowel or bladder functions Difficulty breathing or shortness of breath Warmth, tenderness, or swelling in your calf  Contact Information During office hours (Monday-Friday 9 am to 5 pm), please call your physician at [phone] and ask for Sharlot Gowda After hours and weekends, please call [phone] and speak with the neurosurgeon on call For a life-threatening emergency, call 911 AMBULATORY SURGERY  DISCHARGE INSTRUCTIONS   The drugs that you were given will stay in your system until tomorrow so for the next 24 hours you should not:  Drive an automobile Make any legal decisions Drink any alcoholic beverage  You may resume regular meals tomorrow.  Today it is better to start with liquids and gradually work up to solid foods.  You may eat anything you prefer, but it is better to start with liquids, then soup and crackers, and gradually work up to solid foods.   Please notify your doctor immediately if you have any unusual bleeding, trouble breathing,  redness and pain at the surgery site, drainage, fever, or pain not relieved by medication.    Your post-operative visit with Dr.                                       is: Date:                        Time:    Please call to schedule your post-operative visit.  Additional Instructions:

## 2022-04-17 NOTE — Progress Notes (Signed)
Patient able to void on bedpan . Dr. Myer Haff aware. Patient able to move herself over into a bed, states she "some" sensation bil lower ext. Continues to be able to wiggle toes, bend knee's. Will continue to monitor closely.

## 2022-04-17 NOTE — Progress Notes (Signed)
    Attending Progress Note  History: [name] is here for cauda equina compression   Physical Exam: Vitals:   04/17/22 2000 04/17/22 2030  BP: 117/71 102/62  Pulse: 77 94  Resp: 18 18  Temp:    SpO2: 96% 96%    AA Ox3 CNI  Strength:5/5 throughout BLE She has diminished strength in both legs below her inguinal ligament to light touch and pinch She has full perineal sensation She can urinate without trouble  Data:  Recent Labs  Lab 04/12/22 1317  NA 138  K 3.4*  CL 105  CO2 27  BUN 9  CREATININE 0.71  GLUCOSE 99  CALCIUM 9.0   No results for input(s): "AST", "ALT", "ALKPHOS" in the last 168 hours.  Invalid input(s): "TBILI"   Recent Labs  Lab 04/12/22 1317  WBC 5.7  HGB 13.7  HCT 39.0  PLT 252   No results for input(s): "APTT", "INR" in the last 168 hours.       Other tests/results: n/a  Assessment/Plan:  [name] is POD0 from lumbar discectomy for cauda equina compression.  She is having significant numbness that is not explained by the area of surgery.  She has full strength and no trouble with urination or perineal sensation.  Her symptoms have improved in the past hour and half, but she is not safe to ambulate and go home.  - Will admit for observation - mobilize in AM - pain control - DVT prophylaxis with TED hose - PTOT in AM   Venetia Night MD, [facility] Department of Neurosurgery

## 2022-04-17 NOTE — Discharge Summary (Addendum)
Physician Discharge Summary  Patient ID: [name] MRN: [id number] DOB/AGE: 03-03-1986 36 y.o.  Admit date: 04/17/2022 Discharge date: 04/19/2022  Admission Diagnoses: Lumbar radiculopathy  Discharge Diagnoses:  Principal Problem:   Cauda equina compression [facility])   Discharged Condition: good  Hospital Course:  [name] is a 36 y.o s/p redo left L5-S1 microdiscectomy. Her intraoperative course was uncomplicated however she experienced bilateral leg numbness and urinary retention post-op and was admitted for observation. On POD1 her numbness resolved in her right leg but persisted in her left and she continued to have some trouble with urination. An MRI L spine was done which showed good decompression. On POD2 she reported improvement of her urinary retention this morning and ambulated well with PT. She was cleared for d/c home on POD2.   Consults: None  Significant Diagnostic Studies:  MRI L spine 04/18/22 EXAM: MRI LUMBAR SPINE WITHOUT AND WITH CONTRAST   TECHNIQUE: Multiplanar and multiecho pulse sequences of the lumbar spine were obtained without and with intravenous contrast.   CONTRAST:  26mL GADAVIST GADOBUTROL 1 MMOL/ML IV SOLN   COMPARISON:  04/03/2022   FINDINGS: Segmentation:  Standard   Alignment:  Normal   Vertebrae: Postsurgical change recent right L5 laminectomy and micro discectomy. No acute abnormality.   Conus medullaris and cauda equina: Conus extends to the L1 level. Conus and cauda equina appear normal.   Paraspinal and other soft tissues: Negative   Disc levels:   L1-L2: Normal disc space and facet joints. No spinal canal stenosis. No neural foraminal stenosis.   L2-L3: Normal disc space and facet joints. No spinal canal stenosis. No neural foraminal stenosis.   L3-L4: Normal disc space and facet joints. No spinal canal stenosis. No neural foraminal stenosis.   L4-L5: Normal disc space and facet joints. No spinal canal  stenosis. No neural foraminal stenosis.   L5-S1: Left laminectomy and partial discectomy. There is a small amount of fluid at the laminectomy site measuring approximately 22 x 7 mm. There is nonenhancing material at the left subarticular recess that abuts the left S1 nerve root. Persistent mild narrowing of the left lateral recess without central spinal canal stenosis. No neural foraminal stenosis.   Visualized sacrum: Normal.   IMPRESSION: 1. Postsurgical change of recent right L5 laminectomy and micro discectomy. Small amount of fluid at the laminectomy site, likely postoperative. 2. Nonenhancing material at the left subarticular recess, which abuts the left S1 nerve root.     Electronically Signed   By: Deatra Robinson M.D.   On: 04/18/2022 19:42  Treatments: surgery: as above. Please see separately dictated operative report for further details  Discharge Exam: Blood pressure 118/69, pulse (!) 56, temperature 98 F (36.7 C), resp. rate 16, height 5\' 8"  (1.727 m), weight 87.5 kg, last menstrual period 03/31/2022, SpO2 97 %.  CN II-XII grossly  5/5 throughout BLE Numbness to light touch in posterior LLE Incision c/d/I   Disposition: Discharge disposition: 01-Home or Self Care       Discharge Instructions     Incentive spirometry RT   Complete by: As directed    Remove dressing in 24 hours   Complete by: As directed    Remove dressing in 48 hours   Complete by: As directed       Allergies as of 04/19/2022       Reactions   Contrave [naltrexone-bupropion Hcl Er] Nausea Only   irritability        Medication List  STOP taking these medications    oxyCODONE 5 MG immediate release tablet Commonly known as: Roxicodone       TAKE these medications    gabapentin 300 MG capsule Commonly known as: Neurontin Take 1 capsule (300 mg total) by mouth 3 (three) times daily. What changed:  when to take this reasons to take this   methocarbamol 750 MG  tablet Commonly known as: Robaxin-750 Take 1 tablet (750 mg total) by mouth every 6 (six) hours as needed for muscle spasms.   methylPREDNISolone 4 MG Tbpk tablet Commonly known as: MEDROL DOSEPAK Take as directed on box   oxyCODONE-acetaminophen 5-325 MG tablet Commonly known as: Percocet Take 1 tablet by mouth every 4 (four) hours as needed for up to 5 days for severe pain.        Follow-up Information     Susanne Borders, PA Follow up in 2 week(s).   Specialty: Neurosurgery Why: for post-op, please call for follow up in two weeks  (office closed now) Contact information: [address] [place] [address] [phone]                 Signed: Susanne Borders 04/19/2022, 9:08 AM

## 2022-04-17 NOTE — Op Note (Addendum)
Indications: [name] is a 36 yo female who presented with : Lumbar radiculopathy M54.16, Cauda equina compression G83.4, Left leg weakness R29.898  She had worsening symptoms prompting surgical intervention. She had prior discectomy at this level in 2021.  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   Preoperative Note:   Risks of surgery discussed include: infection, bleeding, stroke, coma, death, paralysis, CSF leak, nerve/spinal cord injury, numbness, tingling, weakness, complex regional pain syndrome, recurrent stenosis and/or disc herniation, vascular injury, development of instability, neck/back pain, need for further surgery, persistent symptoms, development of deformity, and the risks of anesthesia. The patient understood these risks and agreed to proceed.  Operative Note:   1) left L5/S1 microdiscectomy (recurrent)  The patient was then brought from the preoperative center with intravenous access established.  The patient underwent general anesthesia and endotracheal tube intubation, and was then rotated on the [place] rail top where all pressure points were appropriately padded.  The skin was then thoroughly cleansed.  Perioperative antibiotic prophylaxis was administered.  Sterile prep and drapes were then applied and a timeout was then observed.  C-arm was brought into the field under sterile conditions, and the L5-S1 disc space identified, The prior incision was identified.  Once this was complete a 2 cm incision was opened with the use of a #10 blade knife.  The Metrx tubes were sequentially advanced under lateral fluoroscopy until a 18 x 60 mm Metrx tube was placed over the  facet and lamina and secured to the bed.    The microscope was then sterilely brought into the field and muscle creep was hemostased with a bipolar and resected with a pituitary rongeur.  A Bovie extender was then used to expose the spinous process and lamina.  Careful attention was placed to not violate the facet capsule. A 3 mm matchstick drill bit was then used to extend the prior hemi-laminotomy trough until the ligamentum flavum was exposed above the prior surgery.  Once this was complete and the underlying ligamentum flavum was visualized, the remaining ligamentum was dissected with an up angle curette and resected with a #2 and #3 mm biting Kerr[place].  Hemostasis was obtained with Surgifoam and a patty as well as bone wax.   Once the underlying dura was visualized a Penfield 4 was then used to dissect and expose the traversing nerve root.  Once this was identified a nerve root retractor suction was used to mobilize this medially, but the nerve root was tethered due to the disc herniation presenting in the axilla.  Thus, the nerve root was not mobilized.   A small penfied was then used to make a small annulotomy within the disc space and disc space contents were noted to come through the annulus.    The disc herniation was identified and dissected free using a balltip probe. The pituitary rongeur was used to remove the extruded disc fragments. Once the thecal sac and nerve root were noted to be relaxed and under less tension the ball-tipped feeler was passed along the foramen distally to ensure no residual compression was noted.    Depo-Medrol was placed along the nerve root.  The area was irrigated. The tube system was then removed under microscopic visualization and hemostasis  was obtained with a bipolar.    The fascial layer was reapproximated with the use of a 0- Vicryl suture.  Subcutaneous tissue layer was reapproximated using 2-0 Vicryl suture.  3-0 monocryl was used on the skin. The skin was  then cleansed and Dermabond was used to close the skin opening.  Patient was then rotated back to the preoperative bed awakened from anesthesia and taken to recovery all counts are correct in this case.   I performed the entire procedure with the assistance of Manning Charity PA as an Designer, television/film set. An assistant was required for this procedure due to the complexity.  The assistant provided assistance in tissue manipulation and suction, and was required for the successful and safe performance of the procedure. I performed the critical portions of the procedure.   Lori Night MD

## 2022-04-17 NOTE — Progress Notes (Signed)
Patient awake/alert x4, Patient stats she can not feel her legs, but able to wiggle her toes, push herself up in bed.  Attempted to get up into recliner and patient's leg went out from under her. Dr. Myer Haff aware.

## 2022-04-17 NOTE — Anesthesia Postprocedure Evaluation (Signed)
Anesthesia Post Note  Patient: [name]  Procedure(s) Performed: LEFT L5-S1 MICRODISCECTOMY (Left: Back)  Patient location during evaluation: PACU Anesthesia Type: General Level of consciousness: awake and alert Pain management: pain level controlled Vital Signs Assessment: post-procedure vital signs reviewed and stable Respiratory status: spontaneous breathing, nonlabored ventilation, respiratory function stable and patient connected to nasal cannula oxygen Cardiovascular status: blood pressure returned to baseline and stable Postop Assessment: no apparent nausea or vomiting Anesthetic complications: no Comments: Patient did note decreased sensation in b/l lower extremities; however, she had great movement.  Discussed with Dr. Myer Haff who said that it could be due to stretch of the nerves and will likely improve.  He stated that he was less concerned given her ability to move her lower extremities.   No notable events documented.   Last Vitals:  Vitals:   04/17/22 2130 04/17/22 2214  BP: (!) 99/54 119/82  Pulse: 76 68  Resp:  20  Temp: 36.4 C 36.7 C  SpO2: 97% 99%    Last Pain:  Vitals:   04/17/22 2130  TempSrc:   PainSc: 4                  Lenard Simmer

## 2022-04-17 NOTE — Anesthesia Procedure Notes (Signed)
Procedure Name: Intubation Date/Time: 04/17/2022 5:26 PM  Performed by: Hezzie Bump, CRNAPre-anesthesia Checklist: Patient identified, Patient being monitored, Timeout performed, Emergency Drugs available and Suction available Patient Re-evaluated:Patient Re-evaluated prior to induction Oxygen Delivery Method: Circle system utilized Preoxygenation: Pre-oxygenation with 100% oxygen Induction Type: IV induction Ventilation: Mask ventilation without difficulty Laryngoscope Size: 3 and McGraph Grade View: Grade I Tube type: Oral Tube size: 6.5 mm Number of attempts: 1 Airway Equipment and Method: Stylet Placement Confirmation: ETT inserted through vocal cords under direct vision, positive ETCO2 and breath sounds checked- equal and bilateral Secured at: 20 cm Tube secured with: Tape Dental Injury: Teeth and Oropharynx as per pre-operative assessment

## 2022-04-18 ENCOUNTER — Inpatient Hospital Stay: Payer: Managed Care, Other (non HMO)

## 2022-04-18 ENCOUNTER — Ambulatory Visit: Payer: Managed Care, Other (non HMO) | Admitting: Neurosurgery

## 2022-04-18 ENCOUNTER — Encounter: Payer: Self-pay | Admitting: Neurosurgery

## 2022-04-18 ENCOUNTER — Other Ambulatory Visit: Payer: Managed Care, Other (non HMO)

## 2022-04-18 DIAGNOSIS — N9989 Other postprocedural complications and disorders of genitourinary system: Secondary | ICD-10-CM | POA: Diagnosis not present

## 2022-04-18 DIAGNOSIS — F419 Anxiety disorder, unspecified: Secondary | ICD-10-CM | POA: Diagnosis present

## 2022-04-18 DIAGNOSIS — G834 Cauda equina syndrome: Secondary | ICD-10-CM | POA: Diagnosis present

## 2022-04-18 DIAGNOSIS — M5117 Intervertebral disc disorders with radiculopathy, lumbosacral region: Secondary | ICD-10-CM | POA: Diagnosis present

## 2022-04-18 DIAGNOSIS — Z888 Allergy status to other drugs, medicaments and biological substances status: Secondary | ICD-10-CM | POA: Diagnosis not present

## 2022-04-18 DIAGNOSIS — R339 Retention of urine, unspecified: Secondary | ICD-10-CM | POA: Diagnosis not present

## 2022-04-18 MED ORDER — DIAZEPAM 5 MG PO TABS
5.0000 mg | ORAL_TABLET | Freq: Once | ORAL | Status: AC
  Administered 2022-04-18: 5 mg via ORAL
  Filled 2022-04-18: qty 1

## 2022-04-18 MED ORDER — GADOBUTROL 1 MMOL/ML IV SOLN
8.0000 mL | Freq: Once | INTRAVENOUS | Status: AC | PRN
  Administered 2022-04-18: 8 mL via INTRAVENOUS

## 2022-04-18 MED ORDER — DEXAMETHASONE SODIUM PHOSPHATE 4 MG/ML IJ SOLN
4.0000 mg | Freq: Four times a day (QID) | INTRAMUSCULAR | Status: DC
  Administered 2022-04-18 – 2022-04-19 (×4): 4 mg via INTRAVENOUS
  Filled 2022-04-18 (×5): qty 1

## 2022-04-18 NOTE — Progress Notes (Signed)
Physical Therapy Treatment Patient Details Name: [name] MRN: [id number] DOB: 04/18/1986 Today's Date: 04/18/2022   History of Present Illness Pt is a 36 year old female presenting with Lumbar radiculopathy , Cauda equina compression, Left leg weakness, now s/p  left L5/S1 microdiscectomy 04/17/22. PMH significant for  Left L5-S1 microdiscectomy 06/07/20    PT Comments    Patient received in recliner. She is motivated to work with PT. Husband at bedside. She continues to be limited by pain and numbness in L LE. Difficulty moving that leg at all and with weight bearing. She requires min A for sit to stand and was able to ambulate about 10 feet using RW. She will continue to benefit from skilled PT while here to improve functional mobility and independence. Patient voices concerns that she has not urinated since about 7 am and has no urge to urinate. RN notified.        Recommendations for follow up therapy are one component of a multi-disciplinary discharge planning process, led by the attending physician.  Recommendations may be updated based on patient status, additional functional criteria and insurance authorization.  Follow Up Recommendations  Acute inpatient rehab (3hours/day)     Assistance Recommended at Discharge Frequent or constant Supervision/Assistance  Patient can return home with the following A lot of help with walking and/or transfers;A lot of help with bathing/dressing/bathroom;Help with stairs or ramp for entrance;Assist for transportation;Assistance with cooking/housework   Equipment Recommendations  Rolling walker (2 wheels);BSC/3in1    Recommendations for Other Services       Precautions / Restrictions Precautions Precautions: Fall;Back Precaution Comments: no brace needed Restrictions Weight Bearing Restrictions: No     Mobility  Bed Mobility Overal bed mobility: Needs Assistance Bed Mobility: Rolling, Sidelying to Sit Rolling: Mod assist Sidelying  to sit: Mod assist       General bed mobility comments: Not assessed, patient in recliner and remained in recliner    Transfers Overall transfer level: Needs assistance Equipment used: Rolling walker (2 wheels) Transfers: Sit to/from Stand Sit to Stand: Min assist Stand pivot transfers: Min assist         General transfer comment: Cues for hand placement. Assist to position left foot. She is able to take a few turning steps from bed to recliner, became nauseated and had severe pain during this.    Ambulation/Gait Ambulation/Gait assistance: Min assist Gait Distance (Feet): 10 Feet Assistive device: Rolling walker (2 wheels) Gait Pattern/deviations: Step-to pattern, Decreased stride length, Decreased step length - right, Decreased weight shift to left Gait velocity: decreased     General Gait Details: pain limited   Stairs             Wheelchair Mobility    Modified Rankin (Stroke Patients Only)       Balance Overall balance assessment: Needs assistance Sitting-balance support: Feet supported Sitting balance-Leahy Scale: Good     Standing balance support: Bilateral upper extremity supported, During functional activity, Reliant on assistive device for balance Standing balance-Leahy Scale: Fair Standing balance comment: due to pain in left LE and numbness, L LE will buckle on occasion with weight bearing                            Cognition Arousal/Alertness: Awake/alert Behavior During Therapy: WFL for tasks assessed/performed Overall Cognitive Status: Within Functional Limits for tasks assessed  Exercises  Encouraged patient to kick left leg while sitting in recliner as able to improve circulation/pain relief.    General Comments        Pertinent Vitals/Pain Pain Assessment Pain Assessment: Faces Faces Pain Scale: Hurts whole lot Pain Location: LLE, lower back Pain  Descriptors / Indicators: Discomfort, Grimacing, Guarding, Moaning Pain Intervention(s): Monitored during session, Repositioned    Home Living Family/patient expects to be discharged to:: Private residence Living Arrangements: Spouse/significant other;Children Available Help at Discharge: Family Type of Home: House Home Access: Stairs to enter Entrance Stairs-Rails: Doctor, general practice of Steps: 6   Home Layout: One level Home Equipment: Grab bars - tub/shower      Prior Function            PT Goals (current goals can now be found in the care plan section) Acute Rehab PT Goals Patient Stated Goal: to improve, go home PT Goal Formulation: With patient/family Time For Goal Achievement: 05/02/22 Potential to Achieve Goals: Fair Progress towards PT goals: Progressing toward goals    Frequency    BID      PT Plan Current plan remains appropriate    Co-evaluation   Reason for Co-Treatment: For patient/therapist safety;To address functional/ADL transfers   OT goals addressed during session: ADL's and self-care      AM-PAC PT "6 Clicks" Mobility   Outcome Measure  Help needed turning from your back to your side while in a flat bed without using bedrails?: A Lot Help needed moving from lying on your back to sitting on the side of a flat bed without using bedrails?: A Lot Help needed moving to and from a bed to a chair (including a wheelchair)?: A Lot Help needed standing up from a chair using your arms (e.g., wheelchair or bedside chair)?: A Lot Help needed to walk in hospital room?: A Lot Help needed climbing 3-5 steps with a railing? : A Lot 6 Click Score: 12    End of Session Equipment Utilized During Treatment: Gait belt Activity Tolerance: Patient limited by pain Patient left: in chair;with call bell/phone within reach Nurse Communication: Mobility status PT Visit Diagnosis: Other abnormalities of gait and mobility (R26.89);Difficulty in  walking, not elsewhere classified (R26.2);Pain Pain - Right/Left: Left Pain - part of body: Leg     Time: 1430-1455 PT Time Calculation (min) (ACUTE ONLY): 25 min  Charges:  $Gait Training: 23-37 mins                     Mohamad Bruso, PT, GCS 04/18/22,3:17 PM

## 2022-04-18 NOTE — Evaluation (Signed)
Physical Therapy Evaluation Patient Details Name: [name] MRN: [id number] DOB: 05/30/1986 Today's Date: 04/18/2022  History of Present Illness  [name] is s/p redo left L5-S1 microdiscectomy  Clinical Impression  Patient received sitting up on edge of bed with OT present. She is in a lot of pain. (Per OT she required mod A for log rolling and to sit up) Patient was able to take 1 step with OT, then had to sit back down due to pain and nausea. She is able to stand again with min/mod A and RW. Assist to position L LE under her for transfer. Once standing patient is self maintaining NWB on left due to pain. She is able to pivot to her right to recliner with Mod +2 for safety. Again became nauseated and having severe pain. Patient will continue to benefit from skilled PT while here to improve functional mobility and independence.            Recommendations for follow up therapy are one component of a multi-disciplinary discharge planning process, led by the attending physician.  Recommendations may be updated based on patient status, additional functional criteria and insurance authorization.  Follow Up Recommendations Acute inpatient rehab (3hours/day)      Assistance Recommended at Discharge Frequent or constant Supervision/Assistance  Patient can return home with the following  A lot of help with walking and/or transfers;A lot of help with bathing/dressing/bathroom;Help with stairs or ramp for entrance;Assist for transportation;Assistance with cooking/housework    Equipment Recommendations Rolling walker (2 wheels);BSC/3in1  Recommendations for Other Services       Functional Status Assessment Patient has had a recent decline in their functional status and demonstrates the ability to make significant improvements in function in a reasonable and predictable amount of time.     Precautions / Restrictions Precautions Precautions: Fall;Back Precaution Comments: no brace  needed Restrictions Weight Bearing Restrictions: No      Mobility  Bed Mobility Overal bed mobility: Needs Assistance Bed Mobility: Rolling, Sidelying to Sit Rolling: Mod assist Sidelying to sit: Mod assist       General bed mobility comments: heavy use of bed rails, cues for log rolling, pain limited    Transfers Overall transfer level: Needs assistance Equipment used: Rolling walker (2 wheels) Transfers: Sit to/from Stand, Bed to chair/wheelchair/BSC Sit to Stand: Min assist Stand pivot transfers: Min assist         General transfer comment: Cues for hand placement. Assist to position left foot. She is able to take a few turning steps from bed to recliner, became nauseated and had severe pain during this.    Ambulation/Gait               General Gait Details: unable due to pain  Stairs            Wheelchair Mobility    Modified Rankin (Stroke Patients Only)       Balance Overall balance assessment: Needs assistance Sitting-balance support: Feet supported Sitting balance-Leahy Scale: Fair     Standing balance support: Bilateral upper extremity supported, During functional activity, Reliant on assistive device for balance Standing balance-Leahy Scale: Fair Standing balance comment: due to pain in left LE and numbness                             Pertinent Vitals/Pain Pain Assessment Pain Assessment: Faces Faces Pain Scale: Hurts worst Pain Location: back and left leg Pain Descriptors /  Indicators: Discomfort, Grimacing, Guarding Pain Intervention(s): Monitored during session, Repositioned, Patient requesting pain meds-RN notified    Home Living Family/patient expects to be discharged to:: Private residence Living Arrangements: Spouse/significant other;Children Available Help at Discharge: Family Type of Home: House Home Access: Stairs to enter Entrance Stairs-Rails: Doctor, general practice of Steps: 6   Home  Layout: One level Home Equipment: Grab bars - tub/shower      Prior Function Prior Level of Function : Needs assist             Mobility Comments: with support from husband due to LLE pain/weakness ADLs Comments: MOD I- pain with turning     Hand Dominance        Extremity/Trunk Assessment   Upper Extremity Assessment Upper Extremity Assessment: Defer to OT evaluation    Lower Extremity Assessment Lower Extremity Assessment: Generalized weakness;LLE deficits/detail LLE: Unable to fully assess due to pain LLE Sensation: decreased light touch;decreased proprioception LLE Coordination: decreased gross motor    Cervical / Trunk Assessment Cervical / Trunk Assessment: Back Surgery  Communication   Communication: No difficulties  Cognition Arousal/Alertness: Awake/alert Behavior During Therapy: WFL for tasks assessed/performed Overall Cognitive Status: Within Functional Limits for tasks assessed                                          General Comments      Exercises     Assessment/Plan    PT Assessment Patient needs continued PT services  PT Problem List Decreased strength;Decreased mobility;Decreased activity tolerance;Decreased balance;Decreased knowledge of use of DME;Pain;Impaired sensation;Decreased knowledge of precautions       PT Treatment Interventions DME instruction;Therapeutic exercise;Gait training;Stair training;Functional mobility training;Therapeutic activities;Patient/family education;Balance training    PT Goals (Current goals can be found in the Care Plan section)  Acute Rehab PT Goals Patient Stated Goal: to improve, go home PT Goal Formulation: With patient Time For Goal Achievement: 05/02/22 Potential to Achieve Goals: Fair    Frequency BID     Co-evaluation PT/OT/SLP Co-Evaluation/Treatment: Yes Reason for Co-Treatment: For patient/therapist safety;To address functional/ADL transfers PT goals addressed during  session: Mobility/safety with mobility;Proper use of DME         AM-PAC PT "6 Clicks" Mobility  Outcome Measure Help needed turning from your back to your side while in a flat bed without using bedrails?: A Lot Help needed moving from lying on your back to sitting on the side of a flat bed without using bedrails?: A Lot Help needed moving to and from a bed to a chair (including a wheelchair)?: A Lot Help needed standing up from a chair using your arms (e.g., wheelchair or bedside chair)?: A Lot Help needed to walk in hospital room?: Total Help needed climbing 3-5 steps with a railing? : Total 6 Click Score: 10    End of Session Equipment Utilized During Treatment: Gait belt Activity Tolerance: Patient limited by pain Patient left: in chair;with call bell/phone within reach;with chair alarm set;with SCD's reapplied Nurse Communication: Mobility status;Patient requests pain meds PT Visit Diagnosis: Other abnormalities of gait and mobility (R26.89);Difficulty in walking, not elsewhere classified (R26.2);Pain Pain - Right/Left: Left Pain - part of body: Leg (back)    Time: 1017-1030 PT Time Calculation (min) (ACUTE ONLY): 13 min   Charges:   PT Evaluation $PT Eval Moderate Complexity: 1 Mod          [address],  PT, [facility] 04/18/22,11:05 AM

## 2022-04-18 NOTE — Evaluation (Signed)
Occupational Therapy Evaluation Patient Details Name: [name] MRN: [id number] DOB: Jun 29, 1986 Today's Date: 04/18/2022   History of Present Illness Pt is a 36 year old female presenting with Lumbar radiculopathy , Cauda equina compression, Left leg weakness, now s/p  left L5/S1 microdiscectomy 04/17/22. PMH significant for  Left L5-S1 microdiscectomy 06/07/20   Clinical Impression   Chart reviewed, pt greeted in bed agreeable to OT evaluation. Pt is alert and oriented x4. PTA pt is MOD I-I in ADL, reports requiring recent assist from husband for amb due to LLE weakness/pain. Pt works at a [employer] on her feet approx 10 hrs per day and lifts heavy boxes per pt. 6 STE her house. Pt presents with deficits in strength, endurance, activity tolerance, all limited by pain affecting safe and optimal ADL completion. MOD A required for bed mobility on two attempts with first attempt pt requiring to return to supine due to nausea; STS with MIN A +2, step pivot transfer to bedside chair with MIN A +2. Pt with heavy use of bed rails/ RW for mobility with step by step vcs required for safe technique. Pt is limited by pain throughout.  Pt is hopeful to return home, however at this time is performing ADL/functional mobility significantly below PLOF. Recommend intensive rehab to facilitate return to PLOF      Recommendations for follow up therapy are one component of a multi-disciplinary discharge planning process, led by the attending physician.  Recommendations may be updated based on patient status, additional functional criteria and insurance authorization.   Follow Up Recommendations  Acute inpatient rehab (3hours/day)    Assistance Recommended at Discharge Frequent or constant Supervision/Assistance  Patient can return home with the following A lot of help with walking and/or transfers;A lot of help with bathing/dressing/bathroom    Functional Status Assessment  Patient has had a recent  decline in their functional status and demonstrates the ability to make significant improvements in function in a reasonable and predictable amount of time.  Equipment Recommendations  BSC/3in1;Tub/shower seat    Recommendations for Other Services       Precautions / Restrictions Precautions Precautions: Fall;Back Precaution Comments: no brace needed Restrictions Weight Bearing Restrictions: No      Mobility Bed Mobility Overal bed mobility: Needs Assistance Bed Mobility: Sidelying to Sit, Rolling Rolling: Mod assist Sidelying to sit: Mod assist       General bed mobility comments: step by step vcs for body mechanics, heavy use of bed rails    Transfers Overall transfer level: Needs assistance Equipment used: Rolling walker (2 wheels) Transfers: Sit to/from Stand Sit to Stand: Min assist                  Balance Overall balance assessment: Needs assistance Sitting-balance support: Feet supported Sitting balance-Leahy Scale: Fair     Standing balance support: Bilateral upper extremity supported, During functional activity, Reliant on assistive device for balance Standing balance-Leahy Scale: Fair                             ADL either performed or assessed with clinical judgement   ADL Overall ADL's : Needs assistance/impaired Eating/Feeding: Set up;Sitting   Grooming: Sitting;Wash/dry face;Set up               Lower Body Dressing: Maximal assistance   Toilet Transfer: Rolling walker (2 wheels);Minimal assistance;+2 for safety/equipment Toilet Transfer Details (indicate cue type and reason): simulated to bedside  chair Toileting- Clothing Manipulation and Hygiene: Maximal assistance               Vision Patient Visual Report: No change from baseline       Perception     Praxis      Pertinent Vitals/Pain Pain Assessment Pain Assessment: 0-10 Pain Score: 10-Worst pain ever Pain Location: LLE, lower back Pain Descriptors /  Indicators: Discomfort, Grimacing, Guarding Pain Intervention(s): Limited activity within patient's tolerance, Monitored during session, Repositioned, Premedicated before session     Hand Dominance     Extremity/Trunk Assessment Upper Extremity Assessment Upper Extremity Assessment: Overall WFL for tasks assessed   Lower Extremity Assessment Lower Extremity Assessment: Generalized weakness LLE: Unable to fully assess due to pain LLE Sensation: decreased light touch;decreased proprioception LLE Coordination: decreased gross motor   Cervical / Trunk Assessment Cervical / Trunk Assessment: Back Surgery   Communication Communication Communication: No difficulties   Cognition Arousal/Alertness: Awake/alert Behavior During Therapy: WFL for tasks assessed/performed Overall Cognitive Status: Within Functional Limits for tasks assessed                                       General Comments       Exercises     Shoulder Instructions      Home Living Family/patient expects to be discharged to:: Private residence Living Arrangements: Spouse/significant other;Children Available Help at Discharge: Family Type of Home: House Home Access: Stairs to enter Secretary/administrator of Steps: 6 Entrance Stairs-Rails: Right;Left Home Layout: One level     Bathroom Shower/Tub: Producer, television/film/video: Standard Bathroom Accessibility: Yes   Home Equipment: Grab bars - tub/shower          Prior Functioning/Environment Prior Level of Function : Needs assist             Mobility Comments: with support from husband due to LLE pain/weakness ADLs Comments: MOD I- pain with twisting for ADLs        OT Problem List: Decreased strength;Impaired balance (sitting and/or standing);Decreased knowledge of precautions;Decreased range of motion;Decreased safety awareness;Decreased activity tolerance;Decreased knowledge of use of DME or AE      OT  Treatment/Interventions: Self-care/ADL training;Patient/family education;Therapeutic exercise;Balance training;Therapeutic activities;DME and/or AE instruction;Energy conservation    OT Goals(Current goals can be found in the care plan section) Acute Rehab OT Goals Patient Stated Goal: go home OT Goal Formulation: With patient Time For Goal Achievement: 05/02/22 Potential to Achieve Goals: Good ADL Goals Pt Will Perform Grooming: with modified independence;standing Pt Will Perform Lower Body Dressing: with modified independence;sit to/from stand Pt Will Transfer to Toilet: with modified independence;ambulating Pt Will Perform Toileting - Clothing Manipulation and hygiene: with modified independence;sit to/from stand  OT Frequency: Min 4X/week    Co-evaluation PT/OT/SLP Co-Evaluation/Treatment: Yes Reason for Co-Treatment: For patient/therapist safety;To address functional/ADL transfers PT goals addressed during session: Mobility/safety with mobility;Proper use of DME OT goals addressed during session: ADL's and self-care      AM-PAC OT "6 Clicks" Daily Activity     Outcome Measure Help from another person eating meals?: None Help from another person taking care of personal grooming?: A Little Help from another person toileting, which includes using toliet, bedpan, or urinal?: A Lot Help from another person bathing (including washing, rinsing, drying)?: A Lot Help from another person to put on and taking off regular upper body clothing?: A Little Help from another person  to put on and taking off regular lower body clothing?: A Lot 6 Click Score: 16   End of Session Equipment Utilized During Treatment: Gait belt;Rolling walker (2 wheels) Nurse Communication: Mobility status (pain status, vital signs)  Activity Tolerance: Patient limited by pain Patient left: in chair;with call bell/phone within reach;with chair alarm set  OT Visit Diagnosis: Unsteadiness on feet (R26.81);Muscle  weakness (generalized) (M62.81)                Time: 5364-6803 OT Time Calculation (min): 31 min Charges:  OT General Charges $OT Visit: 1 Visit OT Evaluation $OT Eval Low Complexity: 1 Low Oleta Mouse, OTD OTR/L  04/18/22, 12:44 PM

## 2022-04-18 NOTE — Progress Notes (Signed)
Inpatient Rehab Admissions Coordinator Note:   Per therapy recommendations patient was screened for CIR candidacy by Stephania Fragmin, PT. At this time, pt appears to be a potential candidate for CIR. I will place an order for rehab consult for full assessment, per our protocol.  Please contact me any with questions.Estill Dooms, PT, DPT [phone] 04/18/22 11:43 AM

## 2022-04-18 NOTE — Progress Notes (Addendum)
    Attending Progress Note  History: [name] is s/p redo left L5-S1 microdiscectomy.   POD1: Numbness in her legs has improved overnight however she now has her preoperative numbness down the back of her left leg with associated tingling into her left foot.  She has not urinated since last night  Physical Exam: Vitals:   04/17/22 2342 04/18/22 0510  BP: (!) 96/56 103/62  Pulse: 62 (!) 50  Resp: 20 20  Temp: 98.2 F (36.8 C) 98.1 F (36.7 C)  SpO2: 97% 97%    AA Ox3 CNI  Strength:5/5 throughout RLE. 5/5 left HF, KE, DF. 4+ right EHL and PF  Data:  Recent Labs  Lab 04/12/22 1317  NA 138  K 3.4*  CL 105  CO2 27  BUN 9  CREATININE 0.71  GLUCOSE 99  CALCIUM 9.0   No results for input(s): "AST", "ALT", "ALKPHOS" in the last 168 hours.  Invalid input(s): "TBILI"   Recent Labs  Lab 04/12/22 1317  WBC 5.7  HGB 13.7  HCT 39.0  PLT 252   No results for input(s): "APTT", "INR" in the last 168 hours.       Other tests/results: none   Assessment/Plan:  [name] is a 36 year old presenting with lumbar radiculopathy status post redo left L5-S1 microdiscectomy.  Postoperatively she experienced profound numbness in her bilateral lower extremities with intact strength.  Her numbness has since improved.  - mobilize - pain control - DVT prophylaxis - PTOT; dispo planning underway  Manning Charity PA-C Department of Neurosurgery     This is an addendum to the note above.  Manning Charity and I saw the patient approximately 5:30 PM. Over the day, she is noted continued severe pain in her left buttock and some weakness with eversion of her left foot.  She reported ability to urinate, but difficulty feeling the urge to urinate.  Where she previously was able to feel her private area, she reported some concern that she had altered sensation in her perineum.  Her right leg is back to baseline.  Last evening, she had a confusing clinical picture with  nondermatomal numbness with reassuring physical examination.  Today she has 5-5 strength except for left eversion and dorsiflexion, which are 4 out of 5.  She had numbness prior to surgery, which is slightly worse at this time.  The concerning new symptom to me is difficulty with walking due to pain and difficulty sensing the urge for urination.  She was able to urinate, but only urinated in the morning in the evening today.  I discussed this with her including the confusion surrounding her presentation last evening in the recovery area.    The potential explanations for this include traction on the nerve roots with the manipulation required to perform the surgery in the presence of such a large disc herniation, nerve root irritation, lack of full evacuation of the disc herniation, and postoperative fluid collection such as a hematoma.  I reviewed with her that I would be surprised if she had a hematoma or compressive lesion given the reassuring motor findings, but I am perplexed and worried regarding her change in peroneal symptoms and difficulty sensing her urge to urinate.  Thus, I recommended we obtain an MRI scan and made her n.p.o.  If there is a fluid collection, I will take her back to the operating room this evening for evacuation.

## 2022-04-19 MED ORDER — METHYLPREDNISOLONE 4 MG PO TBPK: ORAL_TABLET | ORAL | 0 refills | Status: DC

## 2022-04-19 NOTE — Plan of Care (Signed)
  Problem: Activity: Goal: Risk for activity intolerance will decrease Outcome: Progressing   Problem: Coping: Goal: Level of anxiety will decrease Outcome: Progressing   Problem: Nutrition: Goal: Adequate nutrition will be maintained Outcome: Progressing   

## 2022-04-19 NOTE — Progress Notes (Signed)
Physical Therapy Treatment Patient Details Name: [name] MRN: [id number] DOB: Jun 26, 1986 Today's Date: 04/19/2022   History of Present Illness Pt is a 36 year old female presenting with Lumbar radiculopathy , Cauda equina compression, Left leg weakness, now s/p  left L5/S1 microdiscectomy 04/17/22. PMH significant for  Left L5-S1 microdiscectomy 06/07/20    PT Comments    Pt was I'ly rolling to R side to exit bed upon arriving. She reports feeling much better this date and voiced several times throughout session, "I'll do whatever I need to to go home today." Pt was safely able to exit bed, stand, and ambulate with RW. No LOB or safety concerns however pt is reliant on UE support during dynamic task. Recommend RW + BSC at DC. Pt also performed stairs to simulate home entry. Pt is cleared from an acute PT standpoint for safe DC home once cleared medically.    Recommendations for follow up therapy are one component of a multi-disciplinary discharge planning process, led by the attending physician.  Recommendations may be updated based on patient status, additional functional criteria and insurance authorization.  Follow Up Recommendations  Follow physician's recommendations for discharge plan and follow up therapies (HHPT versus OP PT)     Assistance Recommended at Discharge Intermittent Supervision/Assistance  Patient can return home with the following A little help with walking and/or transfers;A little help with bathing/dressing/bathroom;Assistance with cooking/housework;Assist for transportation;Help with stairs or ramp for entrance   Equipment Recommendations  Rolling walker (2 wheels);BSC/3in1       Precautions / Restrictions Precautions Precautions: Fall;Back Precaution Comments: no brace needed Restrictions Weight Bearing Restrictions: No     Mobility  Bed Mobility Overal bed mobility: Modified Independent Bed Mobility: Rolling, Sidelying to Sit, Supine to  Sit Rolling: Supervision Sidelying to sit: Supervision Supine to sit: Supervision     General bed mobility comments: Pt was I'ly exiting R side of bed upon arriving. no physical assistance required    Transfers Overall transfer level: Needs assistance Equipment used: Rolling walker (2 wheels) Transfers: Sit to/from Stand Sit to Stand: Supervision           General transfer comment: no physical assistance or VCs for STS transfers    Ambulation/Gait Ambulation/Gait assistance: Supervision Gait Distance (Feet): 120 Feet Assistive device: Rolling walker (2 wheels) Gait Pattern/deviations: Step-through pattern, Decreased weight shift to left Gait velocity: decreased     General Gait Details: Pt was easily and safely able to ambulate ~ 120 ft. no LOB however does require use of RW.   Stairs Stairs: Yes Stairs assistance: Supervision Stair Management: One rail Left, Sideways, Step to pattern Number of Stairs: 4 General stair comments: Pt demonstrated safe abilities to ascend/descend 4 stair without physical assistance. Heavy use of rail but overall safe    Balance Overall balance assessment: Needs assistance Sitting-balance support: Feet supported Sitting balance-Leahy Scale: Good     Standing balance support: Bilateral upper extremity supported, During functional activity, Reliant on assistive device for balance Standing balance-Leahy Scale: Fair      Cognition Arousal/Alertness: Awake/alert Behavior During Therapy: WFL for tasks assessed/performed Overall Cognitive Status: Within Functional Limits for tasks assessed      General Comments: Pt is A and O x 4 and eager to DC home               Pertinent Vitals/Pain Pain Assessment Pain Assessment: 0-10 Pain Score: 4  Pain Location: LLE, lower back Pain Descriptors / Indicators: Discomfort, Grimacing, Guarding, Moaning Pain  Intervention(s): Limited activity within patient's tolerance, Monitored during  session, Premedicated before session, Repositioned     PT Goals (current goals can now be found in the care plan section) Acute Rehab PT Goals Patient Stated Goal: go home Progress towards PT goals: Progressing toward goals    Frequency    BID      PT Plan Discharge plan needs to be updated       AM-PAC PT "6 Clicks" Mobility   Outcome Measure  Help needed turning from your back to your side while in a flat bed without using bedrails?: A Little Help needed moving from lying on your back to sitting on the side of a flat bed without using bedrails?: A Little Help needed moving to and from a bed to a chair (including a wheelchair)?: A Little Help needed standing up from a chair using your arms (e.g., wheelchair or bedside chair)?: A Little Help needed to walk in hospital room?: A Little Help needed climbing 3-5 steps with a railing? : A Little 6 Click Score: 18    End of Session   Activity Tolerance: Patient tolerated treatment well Patient left: in chair;with call bell/phone within reach Nurse Communication: Mobility status PT Visit Diagnosis: Other abnormalities of gait and mobility (R26.89);Difficulty in walking, not elsewhere classified (R26.2);Pain Pain - Right/Left: Left Pain - part of body: Leg     Time: 0800-0823 PT Time Calculation (min) (ACUTE ONLY): 23 min  Charges:  $Gait Training: 8-22 mins $Therapeutic Activity: 8-22 mins                     Jetta Lout PTA 04/19/22, 8:53 AM

## 2022-04-19 NOTE — Progress Notes (Signed)
Patient is not able to walk the distance required to go the bathroom, or he/she is unable to safely negotiate stairs required to access the bathroom.  A 3in1 BSC will alleviate this problem  

## 2022-04-19 NOTE — Progress Notes (Signed)
IV discontinue discharge paperwork explain and given to patient. Patient states no questions or concerns at this time.

## 2022-04-19 NOTE — Progress Notes (Signed)
Inpatient Rehab Admissions Coordinator:   Reviewed updated PT note this am an disposition has been changed to home as patient has improvement in symptoms and pain level. Will sign off.   Rehab Admissons Coordinator [place], [place], [place] [phone]

## 2022-05-02 ENCOUNTER — Encounter: Payer: Self-pay | Admitting: Neurosurgery

## 2022-05-02 ENCOUNTER — Ambulatory Visit (INDEPENDENT_AMBULATORY_CARE_PROVIDER_SITE_OTHER): Payer: Managed Care, Other (non HMO) | Admitting: Orthopedic Surgery

## 2022-05-02 VITALS — BP 113/93 | HR 79 | Temp 98.7°F

## 2022-05-02 DIAGNOSIS — Z9889 Other specified postprocedural states: Secondary | ICD-10-CM

## 2022-05-02 DIAGNOSIS — M5442 Lumbago with sciatica, left side: Secondary | ICD-10-CM

## 2022-05-02 DIAGNOSIS — Z09 Encounter for follow-up examination after completed treatment for conditions other than malignant neoplasm: Secondary | ICD-10-CM

## 2022-05-02 DIAGNOSIS — G834 Cauda equina syndrome: Secondary | ICD-10-CM

## 2022-05-02 NOTE — Patient Instructions (Addendum)
It was nice to see you today.   I am glad that you are feeling better!   Okay to get incision wet in the shower, do not submerge in pool or hot tub.   Call if any concerns about the incision such as redness, drainage, or fever/chills.   Try to avoid bending, twisting, or lifting. You can lift up to 10 pounds until your follow up with Dr.  Myer Haff in 4 weeks.   Please call with any questions or concerns.   Drake Leach PA-C [phone]

## 2022-05-02 NOTE — Progress Notes (Signed)
   REFERRING PHYSICIAN:  Venetia Night, Md [address] [place],  [place] [address]  DOS: left L5-S1 microdiscectomy (recurrent) 04/17/22  HISTORY OF PRESENT ILLNESS: [name] is approximately 2 weeks status post left L5-S1 microdiscectomy (recurrent). She had numbness in bilateral legs and urinary retention after surgery. Repeat MRI showed good decompression. Symptoms improved and she was discharged home on POD 2.   She has minimal LBP and her left leg pain is improving from what it was preop. She noted some left leg pain after prolonged walking. No numbness in legs. No bowel/bladder issues.   She is not taking any medications for her back.    PHYSICAL EXAMINATION:  General: Patient is well developed, well nourished, calm, collected, and in no apparent distress.   NEUROLOGICAL:  General: In no acute distress.   Awake, alert, oriented to person, place, and time.  Pupils equal round and reactive to light.  Facial tone is symmetric.  Tongue protrusion is midline.    Strength:     Side Iliopsoas Quads Hamstring PF DF EHL  R 5 5 5 5 5 5   L 5 5 5 5 5 5    Incision c/d/I  Sensation is grossly intact in bilateral LEs   ROS (Neurologic):  Negative except as noted above  IMAGING: Nothing new to review  ASSESSMENT/PLAN:  [name] is doing well approximately 2 weeks after left L5-S1 microdiscectomy (recurrent) 8/16/2. Improvement in LBP and left leg pain. No numbness. No bowel or bladder issues. Treatment options discussed with patient and following plan made:    - I have advised the patient to lift up to 10 pounds until 6 weeks after surgery, then increase up to 25 pounds until 12 weeks after surgery.  After 12 weeks post-op, the patient advised to increase activity as tolerated. - Avoid bending, twisting as able.  - Reviewed wound care.  - Will plan to keep her out of work until her follow up with Dr. Marylouise Stacks in 4 weeks.   Advised to contact the  office if any questions or concerns arise.  12-01-1975 PA-C Department of neurosurgery

## 2022-05-21 ENCOUNTER — Encounter: Payer: Self-pay | Admitting: Neurosurgery

## 2022-05-29 ENCOUNTER — Encounter: Payer: Self-pay | Admitting: *Deleted

## 2022-05-30 ENCOUNTER — Ambulatory Visit (INDEPENDENT_AMBULATORY_CARE_PROVIDER_SITE_OTHER): Payer: Managed Care, Other (non HMO) | Admitting: Neurosurgery

## 2022-05-30 ENCOUNTER — Encounter: Payer: Self-pay | Admitting: Neurosurgery

## 2022-05-30 VITALS — BP 118/78 | Ht 68.0 in | Wt 192.0 lb

## 2022-05-30 DIAGNOSIS — M5416 Radiculopathy, lumbar region: Secondary | ICD-10-CM

## 2022-05-30 DIAGNOSIS — Z09 Encounter for follow-up examination after completed treatment for conditions other than malignant neoplasm: Secondary | ICD-10-CM

## 2022-05-30 NOTE — Progress Notes (Signed)
   REFERRING PHYSICIAN:  Meade Maw, [address] [place],  [place] [address]  DOS: left L5-S1 microdiscectomy (recurrent) 04/17/22  HISTORY OF PRESENT ILLNESS: [name] is status post left L5-S1 microdiscectomy (recurrent).   Her left leg is feeling good.  She began having right lower extremity pain after surgery.  There was some concern for cauda equina compression, but repeat MRI scan showed no evidence of compression.  She is now having low back pain and right leg pain when she bends or stands.  It is not all the time, but can be very severe when it occurs.  Her left leg feels good.    PHYSICAL EXAMINATION:  General: Patient is well developed, well nourished, calm, collected, and in no apparent distress.   NEUROLOGICAL:  General: In no acute distress.   Awake, alert, oriented to person, place, and time.  Pupils equal round and reactive to light.  Facial tone is symmetric.  Tongue protrusion is midline.    Strength:     Side Iliopsoas Quads Hamstring PF DF EHL  R 5 5 5 5 5 5   L 5 5 5 5 5 5    Incision c/d/I  Sensation is grossly intact in bilateral LEs   ROS (Neurologic):  Negative except as noted above  IMAGING: Nothing new to review  ASSESSMENT/PLAN:  [name] is doing better after microdiscectomy, but still has persistent back and right leg pain.  We will start physical therapy for her at [facility] and [facility].  I will also send her for a right-sided L5-S1 epidural steroid injection.  1 could also consider right-sided sacroiliac joint injection if that does not help.  If she is still having pain when I see her next, we will likely need to reimage her again to make sure that she has not suffered a recurrent disc herniation.  I will release her to return to work with restrictions.   Meade Maw MD Department of neurosurgery

## 2022-07-11 ENCOUNTER — Encounter: Payer: Self-pay | Admitting: Neurosurgery

## 2022-07-11 ENCOUNTER — Ambulatory Visit (INDEPENDENT_AMBULATORY_CARE_PROVIDER_SITE_OTHER): Payer: Managed Care, Other (non HMO) | Admitting: Neurosurgery

## 2022-07-11 VITALS — BP 120/78 | Ht 68.0 in | Wt 192.0 lb

## 2022-07-11 DIAGNOSIS — Z09 Encounter for follow-up examination after completed treatment for conditions other than malignant neoplasm: Secondary | ICD-10-CM

## 2022-07-11 DIAGNOSIS — M533 Sacrococcygeal disorders, not elsewhere classified: Secondary | ICD-10-CM

## 2022-07-11 DIAGNOSIS — M5416 Radiculopathy, lumbar region: Secondary | ICD-10-CM

## 2022-07-11 DIAGNOSIS — G834 Cauda equina syndrome: Secondary | ICD-10-CM

## 2022-07-11 NOTE — Progress Notes (Signed)
   REFERRING PHYSICIAN:  Rosario Adie [address] [place],  [place] [address]  DOS: left L5-S1 microdiscectomy (recurrent) 04/17/22  HISTORY OF PRESENT ILLNESS: [name] is status post left L5-S1 microdiscectomy (recurrent).   Her left leg is feeling good.  She continues to have pain in her right lower back that extends into her right posterior leg.  This bothers her particularly at the end of the day.  She is taking ibuprofen to help.  She is having some trouble at work.  She is still on light duty.      PHYSICAL EXAMINATION:  General: Patient is well developed, well nourished, calm, collected, and in no apparent distress.   NEUROLOGICAL:  General: In no acute distress.   Awake, alert, oriented to person, place, and time.  Pupils equal round and reactive to light.  Facial tone is symmetric.  Tongue protrusion is midline.    Strength:     Side Iliopsoas Quads Hamstring PF DF EHL  R 5 5 5 5 5 5   L 5 5 5 5 5 5    Incision c/d/I  Sensation is grossly intact in bilateral Les  Positive tenderness palpation around her right SI joint   ROS (Neurologic):  Negative except as noted above  IMAGING: Nothing new to review  ASSESSMENT/PLAN:  [name] is doing better after microdiscectomy, but still has persistent back and right leg pain.  I am concerned that she may have right sacroiliitis.  I would like to start her with physical therapy.  It is possible that she has recurrent disc herniation again.  I will order a repeat MRI scan.  I think she would benefit from injections, but she is currently at her limit from her insurance company.  I will continue her light duty.  I we will write a letter to her employer to allow her for some flexibility to pursue physical therapy.      MD Department of neurosurgery

## 2022-08-06 ENCOUNTER — Ambulatory Visit: Payer: Managed Care, Other (non HMO)

## 2023-08-28 ENCOUNTER — Ambulatory Visit (INDEPENDENT_AMBULATORY_CARE_PROVIDER_SITE_OTHER): Payer: BC Managed Care – PPO

## 2023-08-28 ENCOUNTER — Other Ambulatory Visit (HOSPITAL_COMMUNITY)
Admission: RE | Admit: 2023-08-28 | Discharge: 2023-08-28 | Disposition: A | Discharge status: Home / Self Care | Payer: BC Managed Care – PPO | Source: Ambulatory Visit | Attending: Obstetrics and Gynecology | Admitting: Obstetrics and Gynecology

## 2023-08-28 VITALS — BP 126/84 | HR 94 | Ht 68.0 in | Wt 202.0 lb

## 2023-08-28 DIAGNOSIS — N898 Other specified noninflammatory disorders of vagina: Secondary | ICD-10-CM | POA: Diagnosis not present

## 2023-08-28 NOTE — Progress Notes (Signed)
    NURSE VISIT NOTE  Subjective:    Patient ID: [name], female    DOB: 09/07/1985, 37 y.o.   MRN: [id number]  HPI  Patient is a 37 y.o. [id number] female who presents for clear, creamy, and malodorous vaginal discharge for 1 week(s). Denies abnormal vaginal bleeding or significant pelvic pain or fever. denies any other SX.Patient denies history of known exposure to STD. Patient verified her label for ancillary swab she self collected in office.    Objective:    BP 126/84   Pulse 94   Ht 5\' 8"  (1.727 m)   Wt 202 lb (91.6 kg)   BMI 30.71 kg/m     Assessment:   1. Vaginal discharge   2. Vaginal odor       Plan:   GC and chlamydia DNA  probe sent to lab.   Loney Laurence, CMA

## 2023-08-29 ENCOUNTER — Encounter: Payer: Self-pay | Admitting: Obstetrics and Gynecology

## 2023-08-29 DIAGNOSIS — B9689 Other specified bacterial agents as the cause of diseases classified elsewhere: Secondary | ICD-10-CM

## 2023-08-29 MED ORDER — METRONIDAZOLE 500 MG PO TABS: 500.0000 mg | ORAL_TABLET | Freq: Two times a day (BID) | ORAL | 0 refills | Status: DC

## 2023-08-29 NOTE — Telephone Encounter (Signed)
Discussed with Althea Grimmer, PAC. She advised ok to send in treatment of flagyl or metronidazole per patient's preference.

## 2023-08-29 NOTE — Telephone Encounter (Signed)
Per Tameka in [facility], specimen was received this morning around 9:30. These specimens are ran one time per day first thing in the morning. This specimen will not get processed until Monday morning.

## 2023-09-01 ENCOUNTER — Other Ambulatory Visit: Payer: Self-pay

## 2023-09-01 LAB — CERVICOVAGINAL ANCILLARY ONLY
Bacterial Vaginitis (gardnerella): POSITIVE — AB
Candida Glabrata: NEGATIVE
Candida Vaginitis: NEGATIVE
Chlamydia: NEGATIVE
Comment: NEGATIVE
Comment: NEGATIVE
Comment: NEGATIVE
Comment: NEGATIVE
Comment: NEGATIVE
Comment: NORMAL
Neisseria Gonorrhea: NEGATIVE
Trichomonas: POSITIVE — AB

## 2023-09-01 NOTE — Progress Notes (Signed)
Patient positive for trichomonas and yeast, please send treatment.

## 2023-09-01 NOTE — Progress Notes (Signed)
A.Copland has already sent medication and I called pt and left voicemail will send mychart message woth info and will let her know to  make apt in 4 weeks for nurse apt.

## 2023-09-15 DIAGNOSIS — A599 Trichomoniasis, unspecified: Secondary | ICD-10-CM | POA: Insufficient documentation

## 2023-09-15 NOTE — Progress Notes (Deleted)
 PCP:  Vicci Duwaine SQUIBB, DO   No chief complaint on file.    HPI:      [name] is a 38 y.o. [id number] whose LMP was No LMP recorded., presents today for her annual examination.  Her menses are regular every 28-30 days, lasting 4-5 days, heavy all the days, changing super tampon Q2 hrs with leaks, with 1/2 dollar sized clots, no BTB.  Dysmenorrhea significant, not improved with NSAIDs. Menses and dysmen are worsening.  NEVER DID GYN U?S LAST YR  Also with non-menstrual pain now, as well as dyspareunia. Has had issues with BRBPR for several yrs, couldn't afford colonoscopy in the past. Bright red blood in stool and with wiping, with every BM. No pain with BM. Has also noticed about 20# wt loss since 12/22, even with increasing food consumption.   Sex activity: single partner, contraception - vasectomy. Pt now with deep dyspareunia with occas light bleeding afterwards. Pt has decreased libido, partly due to pelvic pain.  Last Pap: 10/23/21 Results were: no abnormalities /neg HPV DNA; no hx of abn pap with tx. Hx of STDs: ext warts in past, treated with cryotherapy; no recent lesions; trich 12/24  Diagnosed with trich and BV 12/24 on culture, treated with flagyl .   There is no FH of breast cancer. There is no FH of ovarian cancer. The patient does do self-breast exams. There is a FH of pancreatic cancer in her mother, who is deceased. No genetic testing done--attmepted last yr but needed to speak with GC per insurance and not done.   Tobacco use: The patient denies current or previous tobacco use. Alcohol use: none No drug use.  Exercise: moderately active  She does get adequate calcium but not Vitamin D  in her diet.  Patient Active Problem List   Diagnosis Date Noted   Cauda equina compression (HCC) 04/17/2022   Family history of pancreatic cancer 10/23/2021   Rectal bleeding 10/23/2021   Obesity 07/04/2017   Acute anxiety 05/10/2016   Insomnia 05/10/2016   Heavy  menstrual bleeding 03/29/2015   Low grade squamous intraepithelial lesion (LGSIL) on Papanicolaou smear of cervix 09/29/2013    Past Surgical History:  Procedure Laterality Date   CYST EXCISION     lower back   LUMBAR LAMINECTOMY/DECOMPRESSION MICRODISCECTOMY Left 06/07/2020   Procedure: LEFT L5-S1 MICRODISCECTOMY;  Surgeon: Clois Fret, MD;  Location: [facility] ORS;  Service: Neurosurgery;  Laterality: Left;   LUMBAR LAMINECTOMY/DECOMPRESSION MICRODISCECTOMY Left 04/17/2022   Procedure: LEFT L5-S1 MICRODISCECTOMY;  Surgeon: Clois Fret, MD;  Location: [facility] ORS;  Service: Neurosurgery;  Laterality: Left;   repair of fr left foot     fracture   THERAPEUTIC ABORTION     TUBAL LIGATION  04/21/2011   Procedure: POST PARTUM TUBAL LIGATION;  Surgeon: Gloris DELENA Hugger, MD;  Location: [facility] ORS;  Service: Gynecology;  Laterality: Bilateral;  Bilateral post partum tubal ligation with filshie clips.   tubalectomy Bilateral 2014    Family History  Problem Relation Age of Onset   Hypothyroidism Mother    Pancreatic cancer Mother 46   Hyperthyroidism Father    Stroke Father    Hypertension Father    Hypothyroidism Father    Multiple sclerosis Paternal Aunt    Diabetes Maternal Grandmother    COPD Maternal Grandfather    Stroke Paternal Grandmother    Alzheimer's disease Paternal Grandmother    Leukemia Paternal Grandfather    Heart disease Neg Hx     Social History  Socioeconomic History   Marital status: Married    Spouse name: Not on file   Number of children: Not on file   Years of education: Not on file   Highest education level: Not on file  Occupational History   Not on file  Tobacco Use   Smoking status: Never   Smokeless tobacco: Never  Vaping Use   Vaping status: Never Used  Substance and Sexual Activity   Alcohol use: Yes    Alcohol/week: 42.0 standard drinks of alcohol    Types: 42 Shots of liquor per week    Comment: 6 shots per night   Drug use: No    Sexual activity: Yes    Partners: Male    Birth control/protection: Surgical  Other Topics Concern   Not on file  Social History Narrative   Not on file   Social Drivers of Health   Financial Resource Strain: Not on file  Food Insecurity: Not on file  Transportation Needs: Not on file  Physical Activity: Not on file  Stress: Not on file  Social Connections: Not on file  Intimate Partner Violence: Not on file     Current Outpatient Medications:    metroNIDAZOLE  (FLAGYL ) 500 MG tablet, Take 1 tablet (500 mg total) by mouth 2 (two) times daily., Disp: 14 tablet, Rfl: 0     ROS:  Review of Systems  Constitutional:  Positive for unexpected weight change. Negative for fatigue and fever.  Respiratory:  Negative for cough, shortness of breath and wheezing.   Cardiovascular:  Negative for chest pain, palpitations and leg swelling.  Gastrointestinal:  Positive for blood in stool. Negative for abdominal pain, constipation, diarrhea, nausea and vomiting.  Endocrine: Negative for cold intolerance, heat intolerance and polyuria.  Genitourinary:  Positive for menstrual problem and pelvic pain. Negative for dyspareunia, dysuria, flank pain, frequency, genital sores, hematuria, urgency, vaginal bleeding, vaginal discharge and vaginal pain.  Musculoskeletal:  Negative for back pain, joint swelling and myalgias.  Skin:  Negative for rash.  Neurological:  Negative for dizziness, syncope, light-headedness, numbness and headaches.  Hematological:  Negative for adenopathy.  Psychiatric/Behavioral:  Negative for agitation, confusion, sleep disturbance and suicidal ideas. The patient is not nervous/anxious.    BREAST: No symptoms   Objective: There were no vitals taken for this visit.   Physical Exam Constitutional:      Appearance: She is well-developed.  Genitourinary:     Vulva normal.     Genitourinary Comments: FOBT NOT DONE DUE TO VAGINAL BLEEDING; QUESTION INTERNAL HEMORRHOID      Right Labia: No rash, tenderness or lesions.    Left Labia: No tenderness, lesions or rash.    Vaginal bleeding present.     No vaginal discharge, erythema or tenderness.      Right Adnexa: not tender and no mass present.    Left Adnexa: not tender and no mass present.    No cervical motion tenderness, friability or polyp.     Uterus is tender.     Uterus is not enlarged.  Rectum:     Internal hemorrhoid present.     No rectal mass, anal fissure or external hemorrhoid.  Breasts:    Right: No mass, nipple discharge, skin change or tenderness.     Left: No mass, nipple discharge, skin change or tenderness.  Neck:     Thyroid : No thyromegaly.  Cardiovascular:     Rate and Rhythm: Normal rate and regular rhythm.     Heart sounds: Normal heart  sounds. No murmur heard. Pulmonary:     Effort: Pulmonary effort is normal.     Breath sounds: Normal breath sounds.  Abdominal:     Palpations: Abdomen is soft.     Tenderness: There is abdominal tenderness in the suprapubic area. There is no guarding or rebound.    Musculoskeletal:        General: Normal range of motion.     Cervical back: Normal range of motion.  Lymphadenopathy:     Cervical: No cervical adenopathy.  Neurological:     General: No focal deficit present.     Mental Status: She is alert and oriented to person, place, and time.     Cranial Nerves: No cranial nerve deficit.  Skin:    General: Skin is warm and dry.  Psychiatric:        Mood and Affect: Mood normal.        Behavior: Behavior normal.        Thought Content: Thought content normal.        Judgment: Judgment normal.  Vitals reviewed.     Assessment/Plan: Encounter for annual routine gynecological examination  Cervical cancer screening - Plan: Cytology - PAP  Screening for HPV (human papillomavirus) - Plan: Cytology - PAP  Menorrhagia with regular cycle - Plan: CBC with Differential/Platelet, US  PELVIS TRANSVAGINAL NON-OB (TV ONLY); check labs and  GYN u/s. Will f/u with results and mgmt options. Need to also eval GI issues.   Pelvic pain - Plan: US  PELVIS TRANSVAGINAL NON-OB (TV ONLY); check u/s, if neg, most likely GI related.  Rectal bleeding - Plan: Ambulatory referral to Gastroenterology; refer to GI for eval/mgmt. With wt loss, pelvic pain.  Family history of pancreatic cancer - Plan: [facility]); My Risk testing discussed and done today. Will f/u with results.     GYN counsel adequate intake of calcium and vitamin D , diet and exercise     F/U  No follow-ups on file.  Levander Katzenstein B. Ceirra Belli, PA-C 09/15/2023 3:23 PM

## 2023-09-16 ENCOUNTER — Ambulatory Visit: Payer: Self-pay | Admitting: Obstetrics and Gynecology

## 2023-09-16 DIAGNOSIS — N920 Excessive and frequent menstruation with regular cycle: Secondary | ICD-10-CM

## 2023-09-16 DIAGNOSIS — Z01419 Encounter for gynecological examination (general) (routine) without abnormal findings: Secondary | ICD-10-CM

## 2023-09-16 DIAGNOSIS — A599 Trichomoniasis, unspecified: Secondary | ICD-10-CM

## 2023-09-16 DIAGNOSIS — Z113 Encounter for screening for infections with a predominantly sexual mode of transmission: Secondary | ICD-10-CM

## 2023-09-16 DIAGNOSIS — Z800 Family history of malignant neoplasm of digestive organs: Secondary | ICD-10-CM

## 2023-10-11 DIAGNOSIS — Z1152 Encounter for screening for COVID-19: Secondary | ICD-10-CM | POA: Diagnosis not present

## 2023-10-11 DIAGNOSIS — J101 Influenza due to other identified influenza virus with other respiratory manifestations: Secondary | ICD-10-CM | POA: Diagnosis not present

## 2023-10-11 DIAGNOSIS — R051 Acute cough: Secondary | ICD-10-CM | POA: Diagnosis not present

## 2023-10-11 DIAGNOSIS — R509 Fever, unspecified: Secondary | ICD-10-CM | POA: Diagnosis not present

## 2023-10-11 DIAGNOSIS — K5090 Crohn's disease, unspecified, without complications: Secondary | ICD-10-CM | POA: Diagnosis not present

## 2023-10-15 ENCOUNTER — Telehealth: Payer: Self-pay

## 2023-10-15 NOTE — Telephone Encounter (Signed)
#   given in voicemail-female working with [employer]. Will send my chart message.

## 2023-10-15 NOTE — Telephone Encounter (Signed)
[employer] VOICEMAIL: Patient inquiring if there is something she can do to regulate her cycles. She advised they are coming every two weeks now. [phone]

## 2023-12-18 ENCOUNTER — Encounter (HOSPITAL_COMMUNITY): Payer: Self-pay

## 2023-12-18 DIAGNOSIS — Z87442 Personal history of urinary calculi: Secondary | ICD-10-CM | POA: Diagnosis not present

## 2023-12-18 DIAGNOSIS — N200 Calculus of kidney: Secondary | ICD-10-CM | POA: Diagnosis not present

## 2023-12-18 DIAGNOSIS — R110 Nausea: Secondary | ICD-10-CM | POA: Diagnosis not present

## 2023-12-18 DIAGNOSIS — R10813 Right lower quadrant abdominal tenderness: Secondary | ICD-10-CM | POA: Diagnosis not present

## 2023-12-18 DIAGNOSIS — R6883 Chills (without fever): Secondary | ICD-10-CM | POA: Diagnosis not present

## 2023-12-18 DIAGNOSIS — R1031 Right lower quadrant pain: Secondary | ICD-10-CM | POA: Diagnosis not present

## 2023-12-18 DIAGNOSIS — N390 Urinary tract infection, site not specified: Secondary | ICD-10-CM | POA: Diagnosis not present

## 2023-12-18 DIAGNOSIS — N12 Tubulo-interstitial nephritis, not specified as acute or chronic: Secondary | ICD-10-CM | POA: Diagnosis not present

## 2023-12-19 NOTE — Discharge Summary (Signed)
 Admitting Provider: Vinie Levander Sharyon PONCE, DO Discharge Provider: Derryl Dan, MD Primary Care Physician at Discharge: No primary care provider on file.  Admission Date: 12/18/2023      Admission Diagnosis:  Right nephrolithiasis [N20.0] Discharge Date: 12/19/2023  Problem List: Principal Problem:   Pyelonephritis of right kidney Active Problems:   Ureterolithiasis   Acute cystitis with hematuria   Acute flank pain Resolved Problems: No Active Problems: There are no active problems currently on the Problem List. Please update the Problem List and refresh..   Discharge Diagnosis: Pyelonephritis of right kidney  Home Health needed: No Patient Condition: Improved  Hospital Course:  [name] is a 38 y.o. female with history of recurrent nephrolithiasis, Crohn's disease, chronic lower back pain with chronic sacroiliitis / lumbar radiculopathy (Cu[place] Equinia ruled out by MRI in 2023), chronic mild left leg weakness, chronic post laminectomy syndrome of L5-S1, and intermittent chronic pain syndrome; presenting as a transfer from [facility] ED. She came in with 4 days of right flank pain,  RLQ pain, groin pain, chills, nausea and vomiting x 2 days. UA noted to be potentially positive for UTI.  Right renal angle tenderness, concerning for pyelonephritis.  She was treated with antibiotics, ciprofloxacin , multiple rounds of pain medications. Patient has a 3 mm non-obstructing right sided ureteral stone, no hydronephrosis but noted subtle periureteral stranding. A [facility] Urologist suggested that she may need stenting but declined the admit to their facility due to capacity.   She was transferred to [facility] for urology evaluation and for possible need for stenting.  Repeat CT scan done today morning, does not show the stone.  However he still has some stranding in the right ureter.  Her pain has significantly improved and only has mild discomfort on right renal angle  palpation.  No nausea, vomiting, suprapubic pain, fever, chills. Likely pyelonephritis versus renal colic. She was treated with Zosyn initially with pyuria concerns for complicated UTI.  Urine culture and blood cultures are in process.  Will discharge patient on Augmentin  for now with probiotic.  Also starting patient on Flomax for the ureteral stones.  I spoke with urology team in the morning, they reviewed the CT scan and did not recommend any intervention.  Recommended follow-up in the clinic.  Referral given. Patient being discharged home today.  Operative Procedures Performed:  Treatments: Zosyn Procedures: iv fluids hydration Pertinent Test Results: imaging CT Abdomen Pelvis WO Contrast  Final Result  There is no hydronephrosis, but there is minimal fullness of the right ureter  with periureteral fat stranding, but no sign of ureteral stone. Findings may be  related to a recently passed stone or urinary tract infection. Please correlate  clinically.    No acute intra-abdominal abnormality is seen.      Electronically signed by: Mark Zalaznik, MD 12/19/2023 7:57 AM      Test Results Pending At Discharge: Pending Labs     Order Current Status   Blood Culture, Peripheral #1 In process   Blood Culture, Peripheral #2 In process   Hemoglobin A1c In process   Urine culture In process      Discharge Medications:   Your medication list     START taking these medications      Instructions Last Dose Given Next Dose Due  amoxicillin -pot clavulanate 875-125 mg per tablet Commonly known as: AUGMENTIN     Take ONE tablet by mouth 2 (two) times a day for 7 days.     lactobacillus  10 billion cell capsule Commonly known as: CULTURELLE Start taking on: December 20, 2023    Take ONE capsule by mouth 1 (one) time each day for 44 doses.     ondansetron  ODT 4 mg dispersible tablet Commonly known as: ZOFRAN -ODT    Take ONE tablet by mouth every 8 (eight) hours as needed for nausea:  1st line or vomiting: 1st line.     tamsulosin 0.4 mg 24 hr capsule Commonly known as: FLOMAX    Take ONE capsule by mouth 1 (one) time per day after breakfast for 45 doses.           Where to Get Your Medications     These medications were sent to [facility]  [address], [place] [address]    Hours: 24-hours Phone: [phone]  amoxicillin -pot clavulanate 875-125 mg per tablet lactobacillus 10 billion cell capsule ondansetron  ODT 4 mg dispersible tablet tamsulosin 0.4 mg 24 hr capsule    Discharge Medications     Medication Sig Dispense Start Date End Date Auth. Provider   lactobacillus (CULTURELLE) 10 billion cell capsule Take ONE capsule by mouth 1 (one) time each day for 44 doses. 30 capsule 12/20/2023 02/02/2024 Derryl Dan, MD   ondansetron  ODT (ZOFRAN -ODT) 4 mg dispersible tablet Take ONE tablet by mouth every 8 (eight) hours as needed for nausea: 1st line or vomiting: 1st line. 20 tablet 12/19/2023 01/17/2024 Derryl Dan, MD   tamsulosin (FLOMAX) 0.4 mg 24 hr capsule Take ONE capsule by mouth 1 (one) time per day after breakfast for 45 doses. 30 capsule 12/19/2023 02/02/2024 Derryl Dan, MD   amoxicillin -pot clavulanate (AUGMENTIN ) 875-125 mg per tablet Take ONE tablet by mouth 2 (two) times a day for 7 days. 14 tablet 12/19/2023 12/26/2023 Derryl Dan, MD       Last Recorded Vitals Blood pressure 134/82, pulse 87, temperature 37.1 C (98.7 F), temperature source Oral, resp. rate 19, height 1.727 m (5' 7.99), weight 90.2 kg (198 lb 13.7 oz), SpO2 97%.  Pertinent Physical Exam At Time of Discharge: Physical Exam Constitutional: Normocephalic, atraumatic, not in acute distress HEENT: Pupils are equal, round, and reactive to light. EOM are normal. Oral mucosa moist and pink Neck: Normal range of motion. Neck supple.  Cardiovascular: Normal rate, regular rhythm, normal heart sounds, no murmurs appreciated Pulmonary/Chest: Effort normal.Breath  sounds Normal. Abdominal: Soft, non tender, no guarding/rigidity, mild right renal angle tenderness on palpation, Bowel sounds are present Musculoskeletal: Normal range of motion, no pitting edema Neurological: alert, oriented to person/place/time. Moving all 4 extremities  Skin: Skin is warm and dry.  Psychiatric: normal mood and affect. behavior is normal.    Issues Requiring Follow-Up: You were admitted to the hospital with concerns for stone in the right ureter with inflammation around, with complicated UTI Urine culture in process.  You are being discharged on Augmentin  for 7 days with probiotics Also recommend Flomax daily for a month Follow-up with your PCP in 1 to 2 weeks. Referral to urology, see them in 1 to 2 weeks for renal/ureteral stones management and prevention  Outpatient Follow-Up:  Follow-up Information     Primary care provider (PCP) .          Krystal Caldron, MD .   Specialty: Urology Contact information: [address] [place] [address] [phone]                  Total time spent on discharge: 35 minutes

## 2023-12-19 NOTE — Care Plan (Signed)
  Problem: Neurosensory - Adult Goal: Achieves maximal functionality and self care Outcome: Progressing Flowsheets (Taken 12/19/2023 0302) Achieves maximal functionality and self care: SABRA Monitor swallowing and airway patency with patient fatigue and changes in neurological status . Encourage and assist patient to increase activity and self care with guidance from PT/OT   Problem: Cardiovascular - Adult Goal: Absence of cardiac dysrhythmias or at baseline Outcome: Progressing Flowsheets (Taken 12/19/2023 0302) Absence of cardiac dysrhythmias or at baseline: . Continuous cardiac monitoring, monitor vital signs, obtain 12 lead EKG if indicated . Administer antiarrhythmic and heart rate control medications as ordered   Problem: Gastrointestinal - Adult Goal: Minimal or absence of nausea and vomiting Outcome: Progressing Flowsheets (Taken 12/19/2023 0302) Minimal or absence of nausea and vomiting: . Administer IV fluids as ordered to ensure adequate hydration . Maintain NPO status until nausea and vomiting are resolved . Administer ordered antiemetic medications as needed . Nasogastric tube to low intermittent suction as ordered . Provide nonpharmacologic comfort measures as appropriate Goal: Maintains adequate nutritional intake Outcome: Progressing Flowsheets (Taken 12/19/2023 0302) Maintains adequate nutritional intake: Monitor percentage of each meal consumed   Problem: Skin/Tissue Integrity - Adult Goal: Skin integrity remains intact Outcome: Progressing Flowsheets (Taken 12/19/2023 0302) Skin integrity remains intact: ADMISSION and TWICE DAILY: Assess and document risk factors for pressure ulcer development   Problem: Hematologic - Adult Goal: Maintains hematologic stability Outcome: Progressing Flowsheets (Taken 12/19/2023 0302) Maintains hematologic stability: . Assess for signs and symptoms of bleeding or hemorrhage . Monitor labs

## 2023-12-22 ENCOUNTER — Ambulatory Visit (INDEPENDENT_AMBULATORY_CARE_PROVIDER_SITE_OTHER): Admitting: Family Medicine

## 2023-12-22 ENCOUNTER — Encounter: Payer: Self-pay | Admitting: Family Medicine

## 2023-12-22 VITALS — BP 125/82 | HR 86 | Temp 98.0°F | Ht 68.0 in | Wt 197.8 lb

## 2023-12-22 DIAGNOSIS — N200 Calculus of kidney: Secondary | ICD-10-CM | POA: Diagnosis not present

## 2023-12-22 DIAGNOSIS — N12 Tubulo-interstitial nephritis, not specified as acute or chronic: Secondary | ICD-10-CM

## 2023-12-22 LAB — URINALYSIS, ROUTINE W REFLEX MICROSCOPIC
Bilirubin, UA: NEGATIVE
Glucose, UA: NEGATIVE
Ketones, UA: NEGATIVE
Leukocytes,UA: NEGATIVE
Nitrite, UA: NEGATIVE
Protein,UA: NEGATIVE
Specific Gravity, UA: 1.01 (ref 1.005–1.030)
Urobilinogen, Ur: 0.2 mg/dL (ref 0.2–1.0)
pH, UA: 7 (ref 5.0–7.5)

## 2023-12-22 LAB — MICROSCOPIC EXAMINATION
Bacteria, UA: NONE SEEN
WBC, UA: NONE SEEN /HPF (ref 0–5)

## 2023-12-22 NOTE — Telephone Encounter (Signed)
 This encounter was created in error - please disregard.

## 2023-12-22 NOTE — Progress Notes (Signed)
 BP 125/82 (BP Location: Left Arm, Patient Position: Sitting, Cuff Size: Normal)   Pulse 86   Temp 98 F (36.7 C) (Oral)   Ht 5\' 8"  (1.727 m)   Wt 197 lb 12.8 oz (89.7 kg)   SpO2 98%   BMI 30.08 kg/m    Subjective:    Patient ID: [name], female    DOB: 1986/08/07, 38 y.o.   MRN: [id number]  HPI: [name] is a 38 y.o. female  Chief Complaint  Patient presents with   Hospitalization Follow-up    Pt was admitted from last Wednesday through Saturday for kidney infection and kidney stones.    Transition of Care Hospital Follow up.   Hospital/Facility: [facility] Physician: Dr. Aretta Beery D/C Date: 12/19/23  Records Requested: 12/22/23 Records Received: 12/22/23 Records Reviewed: 12/22/23  Diagnoses on Discharge: Pyelonephritis of right kidney Ureterolithiasis Acute cystitis with hematuria Acute flank pain   Date of interactive Contact within 48 hours of discharge: 12/22/23 Contact was through: direct  Date of 7 day or 14 day face-to-face visit:  12/22/23  within 7 days  Outpatient Encounter Medications as of 12/22/2023  Medication Sig   [DISCONTINUED] metroNIDAZOLE  (FLAGYL ) 500 MG tablet Take 1 tablet (500 mg total) by mouth 2 (two) times daily.   No facility-administered encounter medications on file as of 12/22/2023.  Per Hospitalist: "Hospital Course: [name] is a 38 y.o. female with history of recurrent nephrolithiasis, Crohn's disease, chronic lower back pain with chronic sacroiliitis / lumbar radiculopathy ([place] ruled out by MRI in 2023), chronic mild left leg weakness, chronic post laminectomy syndrome of L5-S1, and intermittent chronic pain syndrome; presenting as a transfer from [facility] ED. She came in with 4 days of right flank pain, RLQ pain, groin pain, chills, nausea and vomiting x 2 days. UA noted to be potentially positive for UTI. Right renal angle tenderness, concerning for pyelonephritis. She was treated with  antibiotics, ciprofloxacin, multiple rounds of pain medications. Patient has a 3 mm non-obstructing right sided ureteral stone, no hydronephrosis but noted subtle periureteral stranding. A [facility] Urologist suggested that she may need stenting but declined the admit to their facility due to capacity.   She was transferred to [facility] for urology evaluation and for possible need for stenting. Repeat CT scan done today morning, does not show the stone. However he still has some stranding in the right ureter. Her pain has significantly improved and only has mild discomfort on right renal angle palpation. No nausea, vomiting, suprapubic pain, fever, chills. Likely pyelonephritis versus renal colic. She was treated with Zosyn initially with pyuria concerns for complicated UTI. Urine culture and blood cultures are in process. Will discharge patient on Augmentin  for now with probiotic. Also starting patient on Flomax for the ureteral stones. I spoke with urology team in the morning, they reviewed the CT scan and did not recommend any intervention. Recommended follow-up in the clinic. Referral given. Patient being discharged home today."   Diagnostic Tests Reviewed:  CT Abdomen Pelvis WO Contrast  Final Result  There is no hydronephrosis, but there is minimal fullness of the right ureter  with periureteral fat stranding, but no sign of ureteral stone. Findings may be  related to a recently passed stone or urinary tract infection. Please correlate  clinically.  No acute intra-abdominal abnormality is seen.   Disposition: Home  Consults: Urology  Discharge Instructions:  Follow up with urology  Disease/illness Education: Discussed today  Home Health/Community Services Discussions/Referrals: Placed today  Establishment or re-establishment of referral orders for community resources: N/A  Discussion with other health care providers: None  Assessment and Support of treatment regimen  adherence: Good  Appointments Coordinated with: Patient  Education for self-management, independent living, and ADLs: Discussion  [name] notes that since she's gotten out of the hospital she has improved. She notes that she is feeling better. She notes that she has still had some pain on the R side in her back. She has been trying to drinking more water. Pain has eased up a bit. She notes that the pain is still more stabbing- but better than it was. She was out of work Fri, Sat, Sunday and will need FMLA. She is not peeing any blood. She is finishing her antibiotics without any issues. No other concerns or complaints at this time.   Relevant past medical, surgical, family and social history reviewed and updated as indicated. Interim medical history since our last visit reviewed. Allergies and medications reviewed and updated.  Review of Systems  Constitutional: Negative.   Respiratory: Negative.    Cardiovascular: Negative.   Gastrointestinal: Negative.   Musculoskeletal:  Positive for back pain and myalgias. Negative for arthralgias, gait problem, joint swelling, neck pain and neck stiffness.  Skin: Negative.   Psychiatric/Behavioral: Negative.      Per HPI unless specifically indicated above     Objective:    BP 125/82 (BP Location: Left Arm, Patient Position: Sitting, Cuff Size: Normal)   Pulse 86   Temp 98 F (36.7 C) (Oral)   Ht 5\' 8"  (1.727 m)   Wt 197 lb 12.8 oz (89.7 kg)   SpO2 98%   BMI 30.08 kg/m   Wt Readings from Last 3 Encounters:  12/22/23 197 lb 12.8 oz (89.7 kg)  08/28/23 202 lb (91.6 kg)  07/11/22 192 lb (87.1 kg)    Physical Exam Vitals and nursing note reviewed.  Constitutional:      General: She is not in acute distress.    Appearance: Normal appearance. She is not ill-appearing, toxic-appearing or diaphoretic.  HENT:     Head: Normocephalic and atraumatic.     Right Ear: External ear normal.     Left Ear: External ear normal.     Nose: Nose  normal.     Mouth/Throat:     Mouth: Mucous membranes are moist.     Pharynx: Oropharynx is clear.  Eyes:     General: No scleral icterus.       Right eye: No discharge.        Left eye: No discharge.     Extraocular Movements: Extraocular movements intact.     Conjunctiva/sclera: Conjunctivae normal.     Pupils: Pupils are equal, round, and reactive to light.  Cardiovascular:     Rate and Rhythm: Normal rate and regular rhythm.     Pulses: Normal pulses.     Heart sounds: Normal heart sounds. No murmur heard.    No friction rub. No gallop.  Pulmonary:     Effort: Pulmonary effort is normal. No respiratory distress.     Breath sounds: Normal breath sounds. No stridor. No wheezing, rhonchi or rales.  Chest:     Chest wall: No tenderness.  Abdominal:     General: Abdomen is flat. Bowel sounds are normal. There is no distension.     Palpations: Abdomen is soft. There is no mass.     Tenderness: There is no abdominal tenderness. There is  no right CVA tenderness, left CVA tenderness, guarding or rebound.     Hernia: No hernia is present.  Musculoskeletal:        General: Normal range of motion.     Cervical back: Normal range of motion and neck supple.  Skin:    General: Skin is warm and dry.     Capillary Refill: Capillary refill takes less than 2 seconds.     Coloration: Skin is not jaundiced or pale.     Findings: No bruising, erythema, lesion or rash.  Neurological:     General: No focal deficit present.     Mental Status: She is alert and oriented to person, place, and time. Mental status is at baseline.  Psychiatric:        Mood and Affect: Mood normal.        Behavior: Behavior normal.        Thought Content: Thought content normal.        Judgment: Judgment normal.     Results for orders placed or performed in visit on 08/28/23  Cervicovaginal ancillary only   Collection Time: 08/28/23  3:12 PM  Result Value Ref Range   Neisseria Gonorrhea Negative    Chlamydia  Negative    Trichomonas Positive (A)    Bacterial Vaginitis (gardnerella) Positive (A)    Candida Vaginitis Negative    Candida Glabrata Negative    Comment      Normal Reference Range Bacterial Vaginosis - Negative   Comment Normal Reference Range Candida Species - Negative    Comment Normal Reference Range Candida Galbrata - Negative    Comment Normal Reference Range Trichomonas - Negative    Comment Normal Reference Ranger Chlamydia - Negative    Comment      Normal Reference Range Neisseria Gonorrhea - Negative      Assessment & Plan:   Problem List Items Addressed This Visit   None Visit Diagnoses       Pyelonephritis    -  Primary   Has 4 days left on her augmentin . Will recheck her UA and culture today and a GC/Chlamydia. Referral to urology pending. Call with any concerns.   Relevant Orders   Ambulatory referral to Urology   Urinalysis, Routine w reflex microscopic   GC/Chlamydia Probe Amp   Urine Culture     Kidney stone       Resolved on 2nd CT. Will get her into urology. Information given about preventing kidney stones. Call with any concerns.   Relevant Orders   Ambulatory referral to Urology   Urinalysis, Routine w reflex microscopic   GC/Chlamydia Probe Amp        Follow up plan: Return in about 3 weeks (around 01/12/2024) for physical.   29 minutes spent with patient today

## 2023-12-24 ENCOUNTER — Encounter: Payer: Self-pay | Admitting: Family Medicine

## 2023-12-24 LAB — GC/CHLAMYDIA PROBE AMP
Chlamydia trachomatis, NAA: NEGATIVE
Neisseria Gonorrhoeae by PCR: NEGATIVE

## 2023-12-25 LAB — URINE CULTURE: Organism ID, Bacteria: NO GROWTH

## 2023-12-29 ENCOUNTER — Telehealth: Payer: Self-pay

## 2023-12-29 NOTE — Telephone Encounter (Signed)
 Left message for patient. Will need below questions answered in order to complete FMLA paperwork for her. She can also log into her mychart account, review and complete that way or stop by the office and complete in person.   Family Medical Leave Act/Short Term Disability Questionnaire Please answer the below questions entirely otherwise there may be delays in the processing of your paperwork.   1) What medical problem (s) are you requesting leave/time away from work for?  2) Are you currently working? Yes ___ No ____ When was your last day worked?   3) Are there tasks/jobs you are unable to complete at work or are difficult to complete?   4) Do you have upcoming appointments you must attend due to the above reason? Such as physical therapy that you must miss work for No ___ Yes ___ When? Where?  5) Do you plan or need to miss more than 3 days in a row? (Continuous Leave) No __Yes, what dates?   6) Do you have times where you are unable to go into work or must leave work early? (Intermittent Leave) No ___ Yes ___ How often does this occur every month?   7) Are there any changes that could allow you to miss less work? No ____ Yes ____  Please explain   8) Anything else you feel is important for your provider to know to complete your paperwork? No ______ Yes   When your paperwork is complete how should we finish processing?  Pick up from front desk ______ Name of person to pick up if not you _____________________       Fax to #: Company/Contact Name: ________________ Your signature below is in acknowledgment and acceptance of the below statements, please review. You agree that [facility] has your permission to complete paperwork received from you or on your behalf related to the above reason. You also agree that [facility] has 7 full business days to complete paperwork and is not required to be returned prior to the end of the 7 th business day.

## 2024-02-02 ENCOUNTER — Encounter: Admitting: Family Medicine

## 2024-03-16 ENCOUNTER — Encounter: Payer: Self-pay | Admitting: Family Medicine

## 2024-03-16 ENCOUNTER — Ambulatory Visit: Admitting: Family Medicine

## 2024-03-16 VITALS — BP 116/78 | HR 88 | Temp 98.6°F | Resp 15 | Ht 67.99 in | Wt 196.6 lb

## 2024-03-16 DIAGNOSIS — Z8379 Family history of other diseases of the digestive system: Secondary | ICD-10-CM

## 2024-03-16 DIAGNOSIS — K2100 Gastro-esophageal reflux disease with esophagitis, without bleeding: Secondary | ICD-10-CM

## 2024-03-16 DIAGNOSIS — Z23 Encounter for immunization: Secondary | ICD-10-CM | POA: Diagnosis not present

## 2024-03-16 DIAGNOSIS — K625 Hemorrhage of anus and rectum: Secondary | ICD-10-CM | POA: Diagnosis not present

## 2024-03-16 DIAGNOSIS — S0340XA Sprain of jaw, unspecified side, initial encounter: Secondary | ICD-10-CM

## 2024-03-16 DIAGNOSIS — Z808 Family history of malignant neoplasm of other organs or systems: Secondary | ICD-10-CM | POA: Diagnosis not present

## 2024-03-16 DIAGNOSIS — F419 Anxiety disorder, unspecified: Secondary | ICD-10-CM

## 2024-03-16 DIAGNOSIS — Z0000 Encounter for general adult medical examination without abnormal findings: Secondary | ICD-10-CM | POA: Diagnosis not present

## 2024-03-16 MED ORDER — OMEPRAZOLE 20 MG PO CPDR
20.0000 mg | DELAYED_RELEASE_CAPSULE | Freq: Every day | ORAL | 3 refills | Status: DC
Start: 2024-03-16 — End: 2024-06-14

## 2024-03-16 MED ORDER — LORAZEPAM 0.5 MG PO TABS: 0.5000 mg | ORAL_TABLET | Freq: Every day | ORAL | 1 refills | Status: AC | PRN

## 2024-03-16 NOTE — Progress Notes (Signed)
 BP 116/78 (BP Location: Left Arm, Patient Position: Sitting, Cuff Size: Normal)   Pulse 88   Temp 98.6 F (37 C) (Oral)   Resp 15   Ht 5' 7.99 (1.727 m)   Wt 196 lb 9.6 oz (89.2 kg)   LMP 03/02/2024 (Approximate)   SpO2 98%   BMI 29.90 kg/m    Subjective:    Patient ID: [name], female    DOB: 1986-04-01, 38 y.o.   MRN: [id number]  HPI: [name] is a 38 y.o. female presenting on 03/16/2024 for comprehensive medical examination. Current medical complaints include:  RECTAL BLEEDING Duration: few years- worsening Bright red rectal bleeding: yes  Amount of blood: severe  Frequency: with every bowel movement and occasionally with passing gas Melena: no  Spotting on toilet tissue: yes  Anal fullness: no  Perianal pain: no  Severity: severe Perianal irritation/itching: no  Constipation: no  Chronic straining/valsava:  no  Anal trauma/intercourse: no  Hemorrhoids: no  Previous colonoscopy: no   ANXIETY/STRESS- Dad has been sick and she's going through a divorce. Has been under a lot of stress. Duration: months Status: uncontrolled Anxious mood: yes  Excessive worrying: no Irritability: yes  Sweating: no Nausea: no Palpitations:yes Hyperventilation: no Panic attacks: yes Agoraphobia: no  Obscessions/compulsions: no Depressed mood: no    12/22/2023   10:24 AM 10/13/2020   10:58 AM 08/13/2019    8:11 AM 06/23/2019   10:03 AM 06/05/2018    4:09 PM  Depression screen PHQ 2/9  Decreased Interest 0 0 0 1 0  Down, Depressed, Hopeless 0 0 0 0 0  PHQ - 2 Score 0 0 0 1 0  Altered sleeping 0   0 3  Tired, decreased energy 0   1 0  Change in appetite 0   0 0  Feeling bad or failure about yourself  0   0 0  Trouble concentrating 0   0 0  Moving slowly or fidgety/restless 0   0 0  Suicidal thoughts 0   0 0  PHQ-9 Score 0   2 3  Difficult doing work/chores    Not difficult at all Not difficult at all   Anhedonia: no Weight changes: no Insomnia: no    Hypersomnia: no Fatigue/loss of energy: yes Feelings of worthlessness: no Feelings of guilt: no Impaired concentration/indecisiveness: no Suicidal ideations: no  Crying spells: no Recent Stressors/Life Changes: no   Relationship problems: no   Family stress: no     Financial stress: no    Job stress: no    Recent death/loss: no  Menopausal Symptoms: no  Depression Screen done today and results listed below:     12/22/2023   10:24 AM 10/13/2020   10:58 AM 08/13/2019    8:11 AM 06/23/2019   10:03 AM 06/05/2018    4:09 PM  Depression screen PHQ 2/9  Decreased Interest 0 0 0 1 0  Down, Depressed, Hopeless 0 0 0 0 0  PHQ - 2 Score 0 0 0 1 0  Altered sleeping 0   0 3  Tired, decreased energy 0   1 0  Change in appetite 0   0 0  Feeling bad or failure about yourself  0   0 0  Trouble concentrating 0   0 0  Moving slowly or fidgety/restless 0   0 0  Suicidal thoughts 0   0 0  PHQ-9 Score 0   2 3  Difficult doing work/chores  Not difficult at all Not difficult at all     Past Medical History:  Past Medical History:  Diagnosis Date   Anxiety    Complication of anesthesia    N/V, passing out with epidural   Headache(784.0)    Insomnia    Lumbar radiculopathy    Ovarian cyst    Pap smear abnormality of cervix with LGSIL    Preterm labor    Seizures (HCC)    Urinary tract infection     Surgical History:  Past Surgical History:  Procedure Laterality Date   CYST EXCISION     lower back   LUMBAR LAMINECTOMY/DECOMPRESSION MICRODISCECTOMY Left 06/07/2020   Procedure: LEFT L5-S1 MICRODISCECTOMY;  Surgeon: Clois Fret, MD;  Location: [facility];  Service: Neurosurgery;  Laterality: Left;   LUMBAR LAMINECTOMY/DECOMPRESSION MICRODISCECTOMY Left 04/17/2022   Procedure: LEFT L5-S1 MICRODISCECTOMY;  Surgeon: Clois Fret, MD;  Location: [facility];  Service: Neurosurgery;  Laterality: Left;   repair of fr left foot     fracture   THERAPEUTIC ABORTION     TUBAL  LIGATION  04/21/2011   Procedure: POST PARTUM TUBAL LIGATION;  Surgeon: Gloris DELENA Hugger, MD;  Location: [facility];  Service: Gynecology;  Laterality: Bilateral;  Bilateral post partum tubal ligation with filshie clips.   tubalectomy Bilateral 2014    Medications:  No current outpatient medications on file prior to visit.   No current facility-administered medications on file prior to visit.    Allergies:  Allergies  Allergen Reactions   Contrave  [Naltrexone -Bupropion  Hcl Er] Nausea Only    irritability    Social History:  Social History   Socioeconomic History   Marital status: Married    Spouse name: Not on file   Number of children: Not on file   Years of education: Not on file   Highest education level: Not on file  Occupational History   Not on file  Tobacco Use   Smoking status: Never   Smokeless tobacco: Never  Vaping Use   Vaping status: Never Used  Substance and Sexual Activity   Alcohol use: Yes    Alcohol/week: 42.0 standard drinks of alcohol    Types: 42 Shots of liquor per week    Comment: 6 shots per night   Drug use: No   Sexual activity: Yes    Partners: Male    Birth control/protection: Surgical  Other Topics Concern   Not on file  Social History Narrative   Not on file   Social Drivers of Health   Financial Resource Strain: Low Risk  (03/16/2024)   Overall Financial Resource Strain (CARDIA)    Difficulty of Paying Living Expenses: Not hard at all  Food Insecurity: No Food Insecurity (03/16/2024)   Hunger Vital Sign    Worried About Running Out of Food in the Last Year: Never true    Ran Out of Food in the Last Year: Never true  Transportation Needs: No Transportation Needs (03/16/2024)   PRAPARE - Administrator, Civil Service (Medical): No    Lack of Transportation (Non-Medical): No  Physical Activity: Insufficiently Active (03/16/2024)   Exercise Vital Sign    Days of Exercise per Week: 7 days    Minutes of Exercise per Session:  20 min  Stress: No Stress Concern Present (03/16/2024)   [employer] of Occupational Health - Occupational Stress Questionnaire    Feeling of Stress: Only a little  Social Connections: Socially Isolated (03/16/2024)   Social Connection and Isolation Panel  Frequency of Communication with Friends and Family: Twice a week    Frequency of Social Gatherings with Friends and Family: Once a week    Attends Religious Services: Never    Database administrator or Organizations: No    Attends Banker Meetings: Never    Marital Status: Divorced  Catering manager Violence: Not At Risk (03/16/2024)   Humiliation, Afraid, Rape, and Kick questionnaire    Fear of Current or Ex-Partner: No    Emotionally Abused: No    Physically Abused: No    Sexually Abused: No   Social History   Tobacco Use  Smoking Status Never  Smokeless Tobacco Never   Social History   Substance and Sexual Activity  Alcohol Use Yes   Alcohol/week: 42.0 standard drinks of alcohol   Types: 42 Shots of liquor per week   Comment: 6 shots per night    Family History:  Family History  Problem Relation Age of Onset   Hypothyroidism Mother    Pancreatic cancer Mother 25   Hyperthyroidism Father    Stroke Father    Hypertension Father    Hypothyroidism Father    Melanoma Father    Prostate cancer Father    Diabetes Maternal Grandmother    COPD Maternal Grandfather    Stroke Paternal Grandmother    Alzheimer's disease Paternal Grandmother    Leukemia Paternal Grandfather    Multiple sclerosis Paternal Aunt    Heart disease Neg Hx     Past medical history, surgical history, medications, allergies, family history and social history reviewed with patient today and changes made to appropriate areas of the chart.   Review of Systems  Constitutional: Negative.   HENT: Negative.         + jaw pain  Eyes: Negative.   Respiratory: Negative.    Cardiovascular: Negative.   Gastrointestinal:  Positive  for abdominal pain, blood in stool, constipation, diarrhea, heartburn and nausea. Negative for melena and vomiting.  Genitourinary: Negative.   Musculoskeletal: Negative.   Skin: Negative.   Neurological:  Positive for dizziness and tingling. Negative for tremors, sensory change, speech change, focal weakness, seizures, loss of consciousness, weakness and headaches.  Endo/Heme/Allergies:  Negative for environmental allergies and polydipsia. Bruises/bleeds easily.  Psychiatric/Behavioral:  Negative for depression, hallucinations, memory loss, substance abuse and suicidal ideas. The patient is nervous/anxious. The patient does not have insomnia.    All other ROS negative except what is listed above and in the HPI.      Objective:    BP 116/78 (BP Location: Left Arm, Patient Position: Sitting, Cuff Size: Normal)   Pulse 88   Temp 98.6 F (37 C) (Oral)   Resp 15   Ht 5' 7.99 (1.727 m)   Wt 196 lb 9.6 oz (89.2 kg)   LMP 03/02/2024 (Approximate)   SpO2 98%   BMI 29.90 kg/m   Wt Readings from Last 3 Encounters:  03/16/24 196 lb 9.6 oz (89.2 kg)  12/22/23 197 lb 12.8 oz (89.7 kg)  08/28/23 202 lb (91.6 kg)    Physical Exam Vitals and nursing note reviewed.  Constitutional:      General: She is not in acute distress.    Appearance: Normal appearance. She is not ill-appearing, toxic-appearing or diaphoretic.  HENT:     Head: Normocephalic and atraumatic.     Right Ear: Tympanic membrane, ear canal and external ear normal. There is no impacted cerumen.     Left Ear: Tympanic membrane, ear canal and  external ear normal. There is no impacted cerumen.     Nose: Nose normal. No congestion or rhinorrhea.     Mouth/Throat:     Mouth: Mucous membranes are moist.     Pharynx: Oropharynx is clear. No oropharyngeal exudate or posterior oropharyngeal erythema.  Eyes:     General: No scleral icterus.       Right eye: No discharge.        Left eye: No discharge.     Extraocular Movements:  Extraocular movements intact.     Conjunctiva/sclera: Conjunctivae normal.     Pupils: Pupils are equal, round, and reactive to light.  Neck:     Vascular: No carotid bruit.  Cardiovascular:     Rate and Rhythm: Normal rate and regular rhythm.     Pulses: Normal pulses.     Heart sounds: No murmur heard.    No friction rub. No gallop.  Pulmonary:     Effort: Pulmonary effort is normal. No respiratory distress.     Breath sounds: Normal breath sounds. No stridor. No wheezing, rhonchi or rales.  Chest:     Chest wall: No tenderness.  Abdominal:     General: Abdomen is flat. Bowel sounds are normal. There is no distension.     Palpations: Abdomen is soft. There is no mass.     Tenderness: There is no abdominal tenderness. There is no right CVA tenderness, left CVA tenderness, guarding or rebound.     Hernia: No hernia is present.  Genitourinary:    Comments: Breast and pelvic exams deferred with shared decision making Musculoskeletal:        General: No swelling, tenderness, deformity or signs of injury.     Cervical back: Normal range of motion and neck supple. No rigidity. No muscular tenderness.     Right lower leg: No edema.     Left lower leg: No edema.  Lymphadenopathy:     Cervical: No cervical adenopathy.  Skin:    General: Skin is warm and dry.     Capillary Refill: Capillary refill takes less than 2 seconds.     Coloration: Skin is not jaundiced or pale.     Findings: No bruising, erythema, lesion or rash.  Neurological:     General: No focal deficit present.     Mental Status: She is alert and oriented to person, place, and time. Mental status is at baseline.     Cranial Nerves: No cranial nerve deficit.     Sensory: No sensory deficit.     Motor: No weakness.     Coordination: Coordination normal.     Gait: Gait normal.     Deep Tendon Reflexes: Reflexes normal.  Psychiatric:        Mood and Affect: Mood normal.        Behavior: Behavior normal.        Thought  Content: Thought content normal.        Judgment: Judgment normal.     Results for orders placed or performed in visit on 12/22/23  GC/Chlamydia Probe Amp   Collection Time: 12/22/23 10:46 AM   Specimen: Urine   UR  Result Value Ref Range   Chlamydia trachomatis, NAA Negative Negative   Neisseria Gonorrhoeae by PCR Negative Negative  Urine Culture   Collection Time: 12/22/23 10:46 AM   Specimen: Urine   UR  Result Value Ref Range   Urine Culture, Routine Final report    Organism ID, Bacteria No growth  Microscopic Examination   Collection Time: 12/22/23 10:46 AM   Urine  Result Value Ref Range   WBC, UA None seen 0 - 5 /hpf   RBC, Urine 0-2 0 - 2 /hpf   Epithelial Cells (non renal) 0-10 0 - 10 /hpf   Bacteria, UA None seen None seen/Few  Urinalysis, Routine w reflex microscopic   Collection Time: 12/22/23 10:46 AM  Result Value Ref Range   Specific Gravity, UA 1.010 1.005 - 1.030   pH, UA 7.0 5.0 - 7.5   Color, UA Yellow Yellow   Appearance Ur Clear Clear   Leukocytes,UA Negative Negative   Protein,UA Negative Negative/Trace   Glucose, UA Negative Negative   Ketones, UA Negative Negative   RBC, UA Trace (A) Negative   Bilirubin, UA Negative Negative   Urobilinogen, Ur 0.2 0.2 - 1.0 mg/dL   Nitrite, UA Negative Negative   Microscopic Examination See below:       Assessment & Plan:   Problem List Items Addressed This Visit       Digestive   Rectal bleeding   Worsening. Was not able to get a colonoscopy in 2023- referral replaced.       Relevant Orders   Ambulatory referral to Gastroenterology   Gastroesophageal reflux disease with esophagitis   Referral to GI placed. Will start omeprazole . Call with any concerns.       Relevant Orders   Ambulatory referral to Gastroenterology     Other   Acute anxiety   In exacerbation. Will start PRN lorazepam . Call with any concerns.       Relevant Medications   LORazepam  (ATIVAN ) 0.5 MG tablet   Other Visit  Diagnoses       Routine general medical examination at a health care facility    -  Primary   Vaccines updated. Screening labs checked today. Continue diet and exercise. Call with any concerns. Continue to monitor.   Relevant Orders   CBC with Differential/Platelet   Comprehensive metabolic panel with GFR   Lipid panel   TSH     Family history of Crohn's disease       Referral to GI placed today.   Relevant Orders   Ambulatory referral to Gastroenterology     Family history of melanoma       Referral to dermatology placed today.   Relevant Orders   Ambulatory referral to Dermatology     Sprain of temporomandibular joint, initial encounter       Will discuss with her dentist. Call with any concerns.        Follow up plan: Return in about 6 months (around 09/16/2024) for 1 month for 2nd HPV and 6 months with me.   LABORATORY TESTING:  - Pap smear: up to date  IMMUNIZATIONS:   - Tdap: Tetanus vaccination status reviewed: Tdap vaccination indicated and given today. - Influenza: Postponed to flu season - Pneumovax: Not applicable - Prevnar: Not applicable - COVID: Not applicable - HPV: Administered today - Shingrix vaccine: Not applicable  PATIENT COUNSELING:   Advised to take 1 mg of folate supplement per day if capable of pregnancy.   Sexuality: Discussed sexually transmitted diseases, partner selection, use of condoms, avoidance of unintended pregnancy  and contraceptive alternatives.   Advised to avoid cigarette smoking.  I discussed with the patient that most people either abstain from alcohol or drink within safe limits (<=14/week and <=4 drinks/occasion for males, <=7/weeks and <= 3 drinks/occasion for females) and that the risk  for alcohol disorders and other health effects rises proportionally with the number of drinks per week and how often a drinker exceeds daily limits.  Discussed cessation/primary prevention of drug use and availability of treatment for abuse.    Diet: Encouraged to adjust caloric intake to maintain  or achieve ideal body weight, to reduce intake of dietary saturated fat and total fat, to limit sodium intake by avoiding high sodium foods and not adding table salt, and to maintain adequate dietary potassium and calcium preferably from fresh fruits, vegetables, and low-fat dairy products.    stressed the importance of regular exercise  Injury prevention: Discussed safety belts, safety helmets, smoke detector, smoking near bedding or upholstery.   Dental health: Discussed importance of regular tooth brushing, flossing, and dental visits.    NEXT PREVENTATIVE PHYSICAL DUE IN 1 YEAR. Return in about 6 months (around 09/16/2024) for 1 month for 2nd HPV and 6 months with me.

## 2024-03-16 NOTE — Assessment & Plan Note (Signed)
 In exacerbation. Will start PRN lorazepam . Call with any concerns.

## 2024-03-16 NOTE — Assessment & Plan Note (Signed)
 Referral to GI placed. Will start omeprazole . Call with any concerns.

## 2024-03-16 NOTE — Assessment & Plan Note (Signed)
 Worsening. Was not able to get a colonoscopy in 2023- referral replaced.

## 2024-03-17 LAB — CBC WITH DIFFERENTIAL/PLATELET
Basophils Absolute: 0 x10E3/uL (ref 0.0–0.2)
Basos: 1 %
EOS (ABSOLUTE): 0.1 x10E3/uL (ref 0.0–0.4)
Eos: 1 %
Hematocrit: 43.1 % (ref 34.0–46.6)
Hemoglobin: 14.3 g/dL (ref 11.1–15.9)
Immature Grans (Abs): 0 x10E3/uL (ref 0.0–0.1)
Immature Granulocytes: 0 %
Lymphocytes Absolute: 1.4 x10E3/uL (ref 0.7–3.1)
Lymphs: 22 %
MCH: 33.2 pg — ABNORMAL HIGH (ref 26.6–33.0)
MCHC: 33.2 g/dL (ref 31.5–35.7)
MCV: 100 fL — ABNORMAL HIGH (ref 79–97)
Monocytes Absolute: 0.6 x10E3/uL (ref 0.1–0.9)
Monocytes: 9 %
Neutrophils Absolute: 4.3 x10E3/uL (ref 1.4–7.0)
Neutrophils: 67 %
Platelets: 248 x10E3/uL (ref 150–450)
RBC: 4.31 x10E6/uL (ref 3.77–5.28)
RDW: 12.3 % (ref 11.7–15.4)
WBC: 6.4 x10E3/uL (ref 3.4–10.8)

## 2024-03-17 LAB — COMPREHENSIVE METABOLIC PANEL WITH GFR
ALT: 16 IU/L (ref 0–32)
AST: 15 IU/L (ref 0–40)
Albumin: 4.5 g/dL (ref 3.9–4.9)
Alkaline Phosphatase: 77 IU/L (ref 44–121)
BUN/Creatinine Ratio: 17 (ref 9–23)
BUN: 12 mg/dL (ref 6–20)
Bilirubin Total: 0.3 mg/dL (ref 0.0–1.2)
CO2: 21 mmol/L (ref 20–29)
Calcium: 9.1 mg/dL (ref 8.7–10.2)
Chloride: 104 mmol/L (ref 96–106)
Creatinine, Ser: 0.72 mg/dL (ref 0.57–1.00)
Globulin, Total: 2.2 g/dL (ref 1.5–4.5)
Glucose: 80 mg/dL (ref 70–99)
Potassium: 5.1 mmol/L (ref 3.5–5.2)
Sodium: 140 mmol/L (ref 134–144)
Total Protein: 6.7 g/dL (ref 6.0–8.5)
eGFR: 110 mL/min/1.73 (ref >59)

## 2024-03-17 LAB — LIPID PANEL
Chol/HDL Ratio: 3.7 ratio (ref 0.0–4.4)
Cholesterol, Total: 173 mg/dL (ref 100–199)
HDL: 47 mg/dL (ref >39)
LDL Chol Calc (NIH): 110 mg/dL — ABNORMAL HIGH (ref 0–99)
Triglycerides: 84 mg/dL (ref 0–149)
VLDL Cholesterol Cal: 16 mg/dL (ref 5–40)

## 2024-03-17 LAB — TSH: TSH: 0.891 u[IU]/mL (ref 0.450–4.500)

## 2024-03-19 ENCOUNTER — Encounter: Payer: Self-pay | Admitting: Nurse Practitioner

## 2024-03-24 ENCOUNTER — Ambulatory Visit: Payer: Self-pay | Admitting: Family Medicine

## 2024-04-13 ENCOUNTER — Telehealth: Payer: Self-pay

## 2024-04-13 DIAGNOSIS — L821 Other seborrheic keratosis: Secondary | ICD-10-CM | POA: Diagnosis not present

## 2024-04-13 DIAGNOSIS — D2262 Melanocytic nevi of left upper limb, including shoulder: Secondary | ICD-10-CM | POA: Diagnosis not present

## 2024-04-13 DIAGNOSIS — D485 Neoplasm of uncertain behavior of skin: Secondary | ICD-10-CM | POA: Diagnosis not present

## 2024-04-13 DIAGNOSIS — L814 Other melanin hyperpigmentation: Secondary | ICD-10-CM | POA: Diagnosis not present

## 2024-04-13 NOTE — Telephone Encounter (Signed)
 Copied from CRM #[id number]. Topic: Clinical - Lab/Test Results >> Apr 13, 2024  3:43 PM Nathanel BROCKS wrote: Reason for CRM:   Darice from [employer] is calling, [phone]   They need additional information, diagnoisis code for lab work. They are sending over a fax for this information. Please be on the outlook for this to complete and send back.

## 2024-04-16 ENCOUNTER — Ambulatory Visit

## 2024-04-16 DIAGNOSIS — Z23 Encounter for immunization: Secondary | ICD-10-CM | POA: Diagnosis not present

## 2024-05-19 ENCOUNTER — Ambulatory Visit: Admitting: Nurse Practitioner

## 2024-05-19 NOTE — Progress Notes (Deleted)
 05/19/2024 TAYLR MEUTH [id number] 01/05/1986   CHIEF COMPLAINT: Crohn's disease, rectal bleeding   HISTORY OF PRESENT ILLNESS:     Latest Ref Rng & Units 03/16/2024   10:30 AM 04/12/2022    1:17 PM 10/23/2021   11:26 AM  CBC  WBC 3.4 - 10.8 x10E3/uL 6.4  5.7  5.1   Hemoglobin 11.1 - 15.9 g/dL 85.6  86.2  86.6   Hematocrit 34.0 - 46.6 % 43.1  39.0  38.7   Platelets 150 - 450 x10E3/uL 248  252  276        Latest Ref Rng & Units 03/16/2024   10:30 AM 04/12/2022    1:17 PM 06/05/2020   10:13 AM  CMP  Glucose 70 - 99 mg/dL 80  99  99   BUN 6 - 20 mg/dL 12  9  12    Creatinine 0.57 - 1.00 mg/dL 9.27  9.28  9.20   Sodium 134 - 144 mmol/L 140  138  136   Potassium 3.5 - 5.2 mmol/L 5.1  3.4  3.8   Chloride 96 - 106 mmol/L 104  105  103   CO2 20 - 29 mmol/L 21  27  26    Calcium 8.7 - 10.2 mg/dL 9.1  9.0  9.2   Total Protein 6.0 - 8.5 g/dL 6.7     Total Bilirubin 0.0 - 1.2 mg/dL 0.3     Alkaline Phos 44 - 121 IU/L 77     AST 0 - 40 IU/L 15     ALT 0 - 32 IU/L 16         Past Medical History:  Diagnosis Date   Anxiety    Complication of anesthesia    N/V, passing out with epidural   Headache(784.0)    Insomnia    Lumbar radiculopathy    Ovarian cyst    Pap smear abnormality of cervix with LGSIL    Preterm labor    Seizures (HCC)    Urinary tract infection    Past Surgical History:  Procedure Laterality Date   CYST EXCISION     lower back   LUMBAR LAMINECTOMY/DECOMPRESSION MICRODISCECTOMY Left 06/07/2020   Procedure: LEFT L5-S1 MICRODISCECTOMY;  Surgeon: Clois Fret, MD;  Location: [facility];  Service: Neurosurgery;  Laterality: Left;   LUMBAR LAMINECTOMY/DECOMPRESSION MICRODISCECTOMY Left 04/17/2022   Procedure: LEFT L5-S1 MICRODISCECTOMY;  Surgeon: Clois Fret, MD;  Location: [facility];  Service: Neurosurgery;  Laterality: Left;   repair of fr left foot     fracture   THERAPEUTIC ABORTION     TUBAL LIGATION  04/21/2011   Procedure: POST PARTUM  TUBAL LIGATION;  Surgeon: Gloris DELENA Hugger, MD;  Location: [facility];  Service: Gynecology;  Laterality: Bilateral;  Bilateral post partum tubal ligation with filshie clips.   tubalectomy Bilateral 2014   Social History:  Family History:    reports that she has never smoked. She has never used smokeless tobacco. She reports current alcohol use of about 42.0 standard drinks of alcohol per week. She reports that she does not use drugs. family history includes Alzheimer's disease in her paternal grandmother; COPD in her maternal grandfather; Diabetes in her maternal grandmother; Hypertension in her father; Hyperthyroidism in her father; Hypothyroidism in her father and mother; Leukemia in her paternal grandfather; Melanoma in her father; Multiple sclerosis in her paternal aunt; Pancreatic cancer (age of onset: 74) in her mother; Prostate cancer in her father; Stroke in her father and paternal grandmother. Allergies  Allergen Reactions   Contrave  [Naltrexone -Bupropion  Hcl Er] Nausea Only    irritability      Outpatient Encounter Medications as of 05/19/2024  Medication Sig   LORazepam  (ATIVAN ) 0.5 MG tablet Take 1 tablet (0.5 mg total) by mouth daily as needed for anxiety.   omeprazole  (PRILOSEC) 20 MG capsule Take 1 capsule (20 mg total) by mouth daily.   No facility-administered encounter medications on file as of 05/19/2024.     REVIEW OF SYSTEMS:  Gen: Denies fever, sweats or chills. No weight loss.  CV: Denies chest pain, palpitations or edema. Resp: Denies cough, shortness of breath of hemoptysis.  GI: Denies heartburn, dysphagia, stomach or lower abdominal pain. No diarrhea or constipation.  GU: Denies urinary burning, blood in urine, increased urinary frequency or incontinence. MS: Denies joint pain, muscles aches or weakness. Derm: Denies rash, itchiness, skin lesions or unhealing ulcers. Psych: Denies depression, anxiety, memory loss or confusion. Heme: Denies bruising, easy  bleeding. Neuro:  Denies headaches, dizziness or paresthesias. Endo:  Denies any problems with DM, thyroid  or adrenal function.  PHYSICAL EXAM: There were no vitals taken for this visit. General: in no acute distress. Head: Normocephalic and atraumatic. Eyes:  Sclerae non-icteric, conjunctive pink. Ears: Normal auditory acuity. Mouth: Dentition intact. No ulcers or lesions.  Neck: Supple, no lymphadenopathy or thyromegaly.  Lungs: Clear bilaterally to auscultation without wheezes, crackles or rhonchi. Heart: Regular rate and rhythm. No murmur, rub or gallop appreciated.  Abdomen: Soft, nontender, nondistended. No masses. No hepatosplenomegaly. Normoactive bowel sounds x 4 quadrants.  Rectal:  Musculoskeletal: Symmetrical with no gross deformities. Skin: Warm and dry. No rash or lesions on visible extremities. Extremities: No edema. Neurological: Alert oriented x 4, no focal deficits.  Psychological: Alert and cooperative. Normal mood and affect.  ASSESSMENT AND PLAN:    CC:  Johnson, Megan P, DO

## 2024-05-25 DIAGNOSIS — D2262 Melanocytic nevi of left upper limb, including shoulder: Secondary | ICD-10-CM | POA: Diagnosis not present

## 2024-05-25 DIAGNOSIS — D2362 Other benign neoplasm of skin of left upper limb, including shoulder: Secondary | ICD-10-CM | POA: Diagnosis not present

## 2024-06-11 ENCOUNTER — Other Ambulatory Visit: Payer: Self-pay | Admitting: Family Medicine

## 2024-06-14 NOTE — Telephone Encounter (Signed)
 Requested Prescriptions  Pending Prescriptions Disp Refills   omeprazole  (PRILOSEC) 20 MG capsule [Pharmacy Med Name: OMEPRAZOLE  DR 20 MG CAPSULE] 90 capsule 1    Sig: TAKE 1 CAPSULE BY MOUTH EVERY DAY     Gastroenterology: Proton Pump Inhibitors Passed - 06/14/2024  2:18 PM      Passed - Valid encounter within last 12 months    Recent Outpatient Visits           3 months ago Routine general medical examination at a health care facility   [facility] [place], Megan P, DO   5 months ago Pyelonephritis   [facility] [place], Megan P, DO

## 2024-06-23 ENCOUNTER — Encounter: Payer: Self-pay | Admitting: Family Medicine

## 2024-06-30 ENCOUNTER — Encounter: Payer: Self-pay | Admitting: Gastroenterology

## 2024-06-30 ENCOUNTER — Ambulatory Visit (INDEPENDENT_AMBULATORY_CARE_PROVIDER_SITE_OTHER): Admitting: Gastroenterology

## 2024-06-30 VITALS — BP 118/68 | HR 76 | Ht 68.0 in | Wt 196.1 lb

## 2024-06-30 DIAGNOSIS — R112 Nausea with vomiting, unspecified: Secondary | ICD-10-CM | POA: Diagnosis not present

## 2024-06-30 DIAGNOSIS — K625 Hemorrhage of anus and rectum: Secondary | ICD-10-CM | POA: Diagnosis not present

## 2024-06-30 DIAGNOSIS — K5900 Constipation, unspecified: Secondary | ICD-10-CM | POA: Insufficient documentation

## 2024-06-30 MED ORDER — NA SULFATE-K SULFATE-MG SULF 17.5-3.13-1.6 GM/177ML PO SOLN: 1.0000 | Freq: Once | ORAL | 0 refills | Status: AC

## 2024-06-30 MED ORDER — PANTOPRAZOLE SODIUM 40 MG PO TBEC: 40.0000 mg | DELAYED_RELEASE_TABLET | Freq: Every day | ORAL | 1 refills | Status: AC

## 2024-06-30 NOTE — Progress Notes (Signed)
 06/30/2024 [name] [id number] [birth year]  Discussed the use of AI scribe software for clinical note transcription with the patient, who gave verbal consent to proceed.  History of Present Illness [name] is a 38 year old female who presents with chronic gastrointestinal symptoms including constipation, nausea, and vomiting. She was referred by Duwaine Louder, her primary care provider, for further evaluation of her gastrointestinal symptoms.  She experiences significant difficulty with bowel movements, sometimes going up to a week without one. Bowel movements are painful and occasionally accompanied by bright red blood. The pain is severe enough to cause her to be 'buckled over.'  Sees sometimes dark red-colored blood in her stool and bright red blood on the toilet paper.  She experiences nausea and vomiting, particularly in the mornings, which she describes as similar to 'morning sickness.' The vomiting sometimes contains blood, and she often expels 'yellow yuck' or brown material. These symptoms have persisted for several years.  She has a family history of Crohn's disease, with her father, two aunts, and a second cousin diagnosed with the condition. Her mother had pancreatic cancer, and her father currently has prostate cancer.  She was previously evaluated by a gastroenterologist about four years ago but did not undergo any procedures due to financial constraints. She was prescribed omeprazole  for acid reflux 20 mg daily, which she took daily until it ran out, but she did not find it helpful.  She was [facility] April at an [facility] for severe kidney stones and a kidney infection, during which she underwent two CT scans that did not show any gastrointestinal abnormalities.  She experiences abdominal pain, sometimes in the lower abdomen and sometimes in the upper abdomen, which she describes as feeling like a heart attack.   Past Medical History:   Diagnosis Date   Anxiety    Complication of anesthesia    N/V, passing out with epidural   Headache(784.0)    Insomnia    Lumbar radiculopathy    Ovarian cyst    Pap smear abnormality of cervix with LGSIL    Preterm labor    Seizures (HCC)    Urinary tract infection    Past Surgical History:  Procedure Laterality Date   CYST EXCISION     lower back   LUMBAR LAMINECTOMY/DECOMPRESSION MICRODISCECTOMY Left 06/07/2020   Procedure: LEFT L5-S1 MICRODISCECTOMY;  Surgeon: Clois Fret, MD;  Location: [facility];  Service: Neurosurgery;  Laterality: Left;   LUMBAR LAMINECTOMY/DECOMPRESSION MICRODISCECTOMY Left 04/17/2022   Procedure: LEFT L5-S1 MICRODISCECTOMY;  Surgeon: Clois Fret, MD;  Location: [facility];  Service: Neurosurgery;  Laterality: Left;   repair of fr left foot     fracture   THERAPEUTIC ABORTION     TUBAL LIGATION  04/21/2011   Procedure: POST PARTUM TUBAL LIGATION;  Surgeon: Gloris DELENA Hugger, MD;  Location: [facility];  Service: Gynecology;  Laterality: Bilateral;  Bilateral post partum tubal ligation with filshie clips.   tubalectomy Bilateral 2014    reports that she has never smoked. She has never used smokeless tobacco. She reports current alcohol use of about 42.0 standard drinks of alcohol per week. She reports that she does not use drugs. family history includes Alzheimer's disease in her paternal grandmother; COPD in her maternal grandfather; Colon polyps in her father; Diabetes in her maternal grandmother; Diverticulitis in her father; Hypertension in her father; Hyperthyroidism in her father; Hypothyroidism in her father and mother; Leukemia in her paternal grandfather; Melanoma in her father;  Multiple sclerosis in her paternal aunt; Pancreatic cancer (age of onset: 26) in her mother; Prostate cancer in her father; Stroke in her father and paternal grandmother. Allergies  Allergen Reactions   Contrave  [Naltrexone -Bupropion  Hcl Er] Nausea Only    irritability       Outpatient Encounter Medications as of 06/30/2024  Medication Sig   LORazepam  (ATIVAN ) 0.5 MG tablet Take 1 tablet (0.5 mg total) by mouth daily as needed for anxiety.   omeprazole  (PRILOSEC) 20 MG capsule TAKE 1 CAPSULE BY MOUTH EVERY DAY   No facility-administered encounter medications on file as of 06/30/2024.     REVIEW OF SYSTEMS  : All other systems reviewed and negative except where noted in the History of Present Illness.   PHYSICAL EXAM: BP 118/68   Pulse 76   Ht 5' 8 (1.727 m)   Wt 196 lb 2 oz (89 kg)   LMP 06/30/2024   SpO2 99%   BMI 29.82 kg/m  General: Well developed white female in no acute distress Head: Normocephalic and atraumatic Eyes:  Sclerae anicteric, conjunctiva pink. Ears: Normal auditory acuity Lungs: Clear throughout to auscultation; no W/R/R. Heart: Regular rate and rhythm; no M/R/G. Abdomen: Soft, non-distended.  BS present.  Mild diffuse TTP. Rectal:  Will be done at the time of colonoscopy. Musculoskeletal: Symmetrical with no gross deformities  Skin: No lesions on visible extremities Extremities: No edema  Neurological: Alert oriented x 4, grossly non-focal Psychological:  Alert and cooperative. Normal mood and affect Assessment & Plan Chronic constipation with associated rectal bleeding Chronic constipation with infrequent bowel movements, hard stools, and straining, likely causing rectal bleeding due to irritation, possibly from hemorrhoids. Previous GI consultation suggested Miralax and Linzess, but neither was initiated. Constipation may contribute to abdominal discomfort. - Provide samples of Linzess 72 mcg to be taken once daily in the morning, starting with the lowest dose and increasing if necessary after 10 days. - Schedule colonoscopy and upper endoscopy to further evaluate gastrointestinal symptoms.  Gastroesophageal reflux disease with associated nausea, vomiting, and abdominal pain Chronic nausea and vomiting with episodes  of hematemesis, possibly due to esophageal irritation from acid reflux. Previous low-dose omeprazole  did not help but may have been insufficient. Symptoms suggest possible nocturnal reflux. Abdominal pain may be related to acid reflux or vomiting. Family history of Crohn's disease, but CT scans show no bowel inflammation. - Prescribe higher dose of omeprazole  (40 mg) to be taken in the evening with dinner, 30-60 minutes before eating. - Advise on lifestyle modifications, including avoiding late-night eating and maintaining a 2-3 hour gap between eating and lying down. - Schedule colonoscopy and upper endoscopy to evaluate for potential ulcers or other causes of symptoms.  Family history of Crohn's disease and cancer Family history includes Crohn's disease in father, aunts, and second cousin, and pancreatic cancer in mother. She is concerned about potential hereditary conditions and wishes to be proactive in her health management.   CC:  Lori Bouchard P, DO

## 2024-06-30 NOTE — Patient Instructions (Signed)
 We have sent the following medications to your pharmacy for you to pick up at your convenience: Pantoprazole 40 mg daily 30-60 minutes before breakfast.   You have been scheduled for a colonoscopy. Please follow written instructions given to you at your visit today.   If you use inhalers (even only as needed), please bring them with you on the day of your procedure.  DO NOT TAKE 7 DAYS PRIOR TO TEST- Trulicity (dulaglutide) Ozempic, Wegovy (semaglutide) Mounjaro (tirzepatide) Bydureon Bcise (exanatide extended release)  DO NOT TAKE 1 DAY PRIOR TO YOUR TEST Rybelsus (semaglutide) Adlyxin (lixisenatide) Victoza  (liraglutide ) Byetta (exanatide) ___________________________________________________________________________

## 2024-07-02 NOTE — Progress Notes (Signed)
 ____________________________________________________________  Attending physician addendum:  Thank you for sending this case to me. I have reviewed the entire note and agree with the plan.   Victory Brand, MD  ____________________________________________________________

## 2024-07-22 ENCOUNTER — Telehealth (INDEPENDENT_AMBULATORY_CARE_PROVIDER_SITE_OTHER): Admitting: Family Medicine

## 2024-07-22 VITALS — Ht 68.0 in | Wt 208.0 lb

## 2024-07-22 DIAGNOSIS — E66811 Obesity, class 1: Secondary | ICD-10-CM

## 2024-07-22 DIAGNOSIS — Z6831 Body mass index (BMI) 31.0-31.9, adult: Secondary | ICD-10-CM | POA: Diagnosis not present

## 2024-07-22 DIAGNOSIS — E6609 Other obesity due to excess calories: Secondary | ICD-10-CM | POA: Diagnosis not present

## 2024-07-22 MED ORDER — ZEPBOUND 5 MG/0.5ML ~~LOC~~ SOAJ: 5.0000 mg | SUBCUTANEOUS | 2 refills | Status: AC

## 2024-07-22 MED ORDER — ZEPBOUND 2.5 MG/0.5ML ~~LOC~~ SOAJ: 2.5000 mg | SUBCUTANEOUS | 0 refills | Status: AC

## 2024-07-22 NOTE — Assessment & Plan Note (Addendum)
 Would like to try zepbound . Has tried diet and exercise for >6 months without significant benefit. Will start zepbound  and recheck in 6-8 weeks. Call with any concerns.

## 2024-07-22 NOTE — Progress Notes (Signed)
 Ht 5' 8 (1.727 m)   Wt 208 lb (94.3 kg)   LMP 06/30/2024   BMI 31.63 kg/m    Subjective:    Patient ID: [name], female    DOB: [birth year], 38 y.o.   MRN: [id number]  HPI: [name] is a 38 y.o. female  Chief Complaint  Patient presents with   Obesity   OBESITY Duration: chronic Previous attempts at weight loss: yes, diet, exercise, portion control Complications of obesity: GERD Peak weight: 225lbs (current- 208) Weight loss goal: 25lbs Weight loss to date: 17lbs Requesting obesity pharmacotherapy: yes Current weight loss supplements/medications: no Previous weight loss supplements/meds: yes- saxenda   Clinical coverage for weight loss GLP's   Medication being dispensed is Zepbound 2 mL/28 days. Titration doses are 2 mL/28 days.   [x]  Product being prescribed is FDA approved for the indication, age, weight (if applicable) and not does not exceed dosing limits per the Prescribing Information per the clinical conditions for use.  [x]  Patient's baseline weight measured within the last 45 days as required by provider before dispensing.  [x]  Patient is new to therapy and One of the following:   [x]  The beneficiary is 38 years of age or over and has ONE of the following:  [x]  A BMI greater than or equal to 30 kg/m2  [x]  A BMI greater than or equal to 27 kg/m2 with at least one weight-related comorbidity/risk factor/complication (i.e. hypertension, type 2 diabetes, obstructive sleep apnea, cardiovascular disease, dyslipidemia)  [x]   If patient has one weight-related comorbidity/risk factor/complication (i.e. hypertension, type 2 diabetes, obstructive sleep apnea, cardiovascular disease, dyslipidemia), please list  Patient suffers from weight-related comorbidity/risk factor/complication GERD  [x]  The beneficiary has participated in 6 months or greater of lifestyle modification and will continue while on medication including structured nutrition following  provider recommend dietary modifications and physical activity, unless physical activity is not clinically appropriate at the time GLP1 therapy commences AND  [x]  The beneficiary will NOT be using the requested agent in combination with another GLP-1 receptor agonist agent AND  [x]  The beneficiary does NOT have any FDA-labeled contraindications to the requested agent, including pregnancy, lactation, history of medullary thyroid  cancer or multiple endocrine neoplasia type II.   Last BMI/Weight/Height recorded Estimated body mass index is 31.63 kg/m as calculated from the following:   Height as of this encounter: 5' 8 (1.727 m).   Weight as of this encounter: 208 lb (94.3 kg).     Relevant past medical, surgical, family and social history reviewed and updated as indicated. Interim medical history since our last visit reviewed. Allergies and medications reviewed and updated.  Review of Systems  Constitutional: Negative.   Respiratory: Negative.    Cardiovascular: Negative.   Musculoskeletal: Negative.   Psychiatric/Behavioral: Negative.      Per HPI unless specifically indicated above     Objective:    Ht 5' 8 (1.727 m)   Wt 208 lb (94.3 kg)   LMP 06/30/2024   BMI 31.63 kg/m   Wt Readings from Last 3 Encounters:  07/22/24 208 lb (94.3 kg)  06/30/24 196 lb 2 oz (89 kg)  03/16/24 196 lb 9.6 oz (89.2 kg)    Physical Exam Vitals and nursing note reviewed.  Constitutional:      General: She is not in acute distress.    Appearance: Normal appearance. She is not ill-appearing, toxic-appearing or diaphoretic.  HENT:     Head: Normocephalic and atraumatic.     Right Ear: External  ear normal.     Left Ear: External ear normal.     Nose: Nose normal.     Mouth/Throat:     Mouth: Mucous membranes are moist.     Pharynx: Oropharynx is clear.  Eyes:     General: No scleral icterus.       Right eye: No discharge.        Left eye: No discharge.     Conjunctiva/sclera:  Conjunctivae normal.     Pupils: Pupils are equal, round, and reactive to light.  Pulmonary:     Effort: Pulmonary effort is normal. No respiratory distress.     Comments: Speaking in full sentences Musculoskeletal:        General: Normal range of motion.     Cervical back: Normal range of motion.  Skin:    Coloration: Skin is not jaundiced or pale.     Findings: No bruising, erythema, lesion or rash.  Neurological:     Mental Status: She is alert and oriented to person, place, and time. Mental status is at baseline.  Psychiatric:        Mood and Affect: Mood normal.        Behavior: Behavior normal.        Thought Content: Thought content normal.        Judgment: Judgment normal.     Results for orders placed or performed in visit on 03/16/24  CBC with Differential/Platelet   Collection Time: 03/16/24 10:30 AM  Result Value Ref Range   WBC 6.4 3.4 - 10.8 x10E3/uL   RBC 4.31 3.77 - 5.28 x10E6/uL   Hemoglobin 14.3 11.1 - 15.9 g/dL   Hematocrit 56.8 65.9 - 46.6 %   MCV 100 (H) 79 - 97 fL   MCH 33.2 (H) 26.6 - 33.0 pg   MCHC 33.2 31.5 - 35.7 g/dL   RDW 87.6 88.2 - 84.5 %   Platelets 248 150 - 450 x10E3/uL   Neutrophils 67 Not Estab. %   Lymphs 22 Not Estab. %   Monocytes 9 Not Estab. %   Eos 1 Not Estab. %   Basos 1 Not Estab. %   Neutrophils Absolute 4.3 1.4 - 7.0 x10E3/uL   Lymphocytes Absolute 1.4 0.7 - 3.1 x10E3/uL   Monocytes Absolute 0.6 0.1 - 0.9 x10E3/uL   EOS (ABSOLUTE) 0.1 0.0 - 0.4 x10E3/uL   Basophils Absolute 0.0 0.0 - 0.2 x10E3/uL   Immature Granulocytes 0 Not Estab. %   Immature Grans (Abs) 0.0 0.0 - 0.1 x10E3/uL  Comprehensive metabolic panel with GFR   Collection Time: 03/16/24 10:30 AM  Result Value Ref Range   Glucose 80 70 - 99 mg/dL   BUN 12 6 - 20 mg/dL   Creatinine, Ser 9.27 0.57 - 1.00 mg/dL   eGFR 889 >40 fO/fpw/8.26   BUN/Creatinine Ratio 17 9 - 23   Sodium 140 134 - 144 mmol/L   Potassium 5.1 3.5 - 5.2 mmol/L   Chloride 104 96 - 106  mmol/L   CO2 21 20 - 29 mmol/L   Calcium 9.1 8.7 - 10.2 mg/dL   Total Protein 6.7 6.0 - 8.5 g/dL   Albumin 4.5 3.9 - 4.9 g/dL   Globulin, Total 2.2 1.5 - 4.5 g/dL   Bilirubin Total 0.3 0.0 - 1.2 mg/dL   Alkaline Phosphatase 77 44 - 121 IU/L   AST 15 0 - 40 IU/L   ALT 16 0 - 32 IU/L  Lipid panel   Collection Time: 03/16/24 10:30 AM  Result Value Ref Range   Cholesterol, Total 173 100 - 199 mg/dL   Triglycerides 84 0 - 149 mg/dL   HDL 47 >60 mg/dL   VLDL Cholesterol Cal 16 5 - 40 mg/dL   LDL Chol Calc (NIH) 889 (H) 0 - 99 mg/dL   Chol/HDL Ratio 3.7 0.0 - 4.4 ratio  TSH   Collection Time: 03/16/24 10:30 AM  Result Value Ref Range   TSH 0.891 0.450 - 4.500 uIU/mL      Assessment & Plan:   Problem List Items Addressed This Visit       Other   Obesity - Primary   Would like to try zepbound. Has tried diet and exercise for >6 months without significant benefit. Will start zepbound and recheck in 6-8 weeks. Call with any concerns.       Relevant Medications   tirzepatide (ZEPBOUND) 2.5 MG/0.5ML Pen   tirzepatide (ZEPBOUND) 5 MG/0.5ML Pen (Start on 08/19/2024)     Follow up plan: Return for 6-8 weeks.   This visit was completed via video visit through MyChart due to the restrictions of the COVID-19 pandemic. All issues as above were discussed and addressed. Physical exam was done as above through visual confirmation on video through MyChart. If it was felt that the patient should be evaluated in the office, they were directed there. The patient verbally consented to this visit. Location of the patient: work Location of the provider: work Those involved with this call:  Provider: Duwaine Louder, DO CMA: York Fogo, CMA, Front Desk/Registration: Claretta Maiden  Time spent on call: 15 minutes with patient face to face via video conference. More than 50% of this time was spent in counseling and coordination of care. 23 minutes total spent in review of patient's record and  preparation of their chart.

## 2024-08-01 ENCOUNTER — Telehealth: Payer: Self-pay | Admitting: Nurse Practitioner

## 2024-08-01 NOTE — Telephone Encounter (Signed)
 Patient contacted our weekend on-call service Sunday, 08/01/2024.  She had questions regarding her Suprep for colonoscopy scheduled tomorrow afternoon with Dr. Legrand.  Patient was instructed to maintain a clear liquid diet today including Gatorade, popsicles, Jell-O, tea, sodas, chicken/beef or vegetable broth and water.  Patient to take first dose of Suprep this evening and second dose at 5 AM.  She is aware to mix the Suprep in the provide a container and to fill with water, followed by filling the same container x 2 with water with each dose.  Patient aware she must remain n.p.o. 5 hours before her procedure time.  Colonoscopy scheduled 08/02/2024 at 2:30 PM.

## 2024-08-02 ENCOUNTER — Encounter: Payer: Self-pay | Admitting: Gastroenterology

## 2024-08-02 ENCOUNTER — Ambulatory Visit: Admitting: Gastroenterology

## 2024-08-02 VITALS — BP 110/72 | HR 89 | Temp 97.2°F | Resp 17 | Ht 68.0 in | Wt 196.0 lb

## 2024-08-02 DIAGNOSIS — D128 Benign neoplasm of rectum: Secondary | ICD-10-CM

## 2024-08-02 DIAGNOSIS — R112 Nausea with vomiting, unspecified: Secondary | ICD-10-CM

## 2024-08-02 DIAGNOSIS — K5730 Diverticulosis of large intestine without perforation or abscess without bleeding: Secondary | ICD-10-CM

## 2024-08-02 DIAGNOSIS — K648 Other hemorrhoids: Secondary | ICD-10-CM | POA: Diagnosis not present

## 2024-08-02 DIAGNOSIS — K625 Hemorrhage of anus and rectum: Secondary | ICD-10-CM | POA: Diagnosis not present

## 2024-08-02 DIAGNOSIS — K5900 Constipation, unspecified: Secondary | ICD-10-CM

## 2024-08-02 MED ORDER — SODIUM CHLORIDE 0.9 % IV SOLN: 500.0000 mL | Freq: Once | INTRAVENOUS | Status: DC

## 2024-08-02 NOTE — Patient Instructions (Addendum)
 Resume previous diet.  Continue present medications.  Await pathology results.   Repeat colonoscopy in next available hospital outpatient appt for advanced polypectomy under general anesthesia.   MD office will reach out to schedule follow up appointments.   YOU HAD AN ENDOSCOPIC PROCEDURE TODAY AT THE [facility]:   Refer to the procedure report that was given to you for any specific questions about what was found during the examination.  If the procedure report does not answer your questions, please call your gastroenterologist to clarify.  If you requested that your care partner not be given the details of your procedure findings, then the procedure report has been included in a sealed envelope for you to review at your convenience later.  YOU SHOULD EXPECT: Some feelings of bloating in the abdomen. Passage of more gas than usual.  Walking can help get rid of the air that was put into your GI tract during the procedure and reduce the bloating. If you had a lower endoscopy (such as a colonoscopy or flexible sigmoidoscopy) you may notice spotting of blood in your stool or on the toilet paper. If you underwent a bowel prep for your procedure, you may not have a normal bowel movement for a few days.  Please Note:  You might notice some irritation and congestion in your nose or some drainage.  This is from the oxygen used during your procedure.  There is no need for concern and it should clear up in a day or so.  SYMPTOMS TO REPORT IMMEDIATELY:  Following lower endoscopy (colonoscopy or flexible sigmoidoscopy):  Excessive amounts of blood in the stool  Significant tenderness or worsening of abdominal pains  Swelling of the abdomen that is new, acute  Fever of 100F or higher  Following upper endoscopy (EGD)  Vomiting of blood or coffee ground material  New chest pain or pain under the shoulder blades  Painful or persistently difficult swallowing  New shortness of breath  Fever  of 100F or higher  Black, tarry-looking stools  For urgent or emergent issues, a gastroenterologist can be reached at any hour by calling [phone]. Do not use MyChart messaging for urgent concerns.    DIET:  We do recommend a small meal at first, but then you may proceed to your regular diet.  Drink plenty of fluids but you should avoid alcoholic beverages for 24 hours.  ACTIVITY:  You should plan to take it easy for the rest of today and you should NOT DRIVE or use heavy machinery until tomorrow (because of the sedation medicines used during the test).    FOLLOW UP: Our staff will call the number listed on your records the next business day following your procedure.  We will call around 7:15- 8:00 am to check on you and address any questions or concerns that you may have regarding the information given to you following your procedure. If we do not reach you, we will leave a message.     If any biopsies were taken you will be contacted by phone or by letter within the next 1-3 weeks.  Please call us  at [phone] if you have not heard about the biopsies in 3 weeks.    SIGNATURES/CONFIDENTIALITY: You and/or your care partner have signed paperwork which will be entered into your electronic medical record.  These signatures attest to the fact that that the information above on your After Visit Summary has been reviewed and is understood.  Full responsibility of the confidentiality  of this discharge information lies with you and/or your care-partner.

## 2024-08-02 NOTE — Progress Notes (Unsigned)
 Sedate, gd SR, tolerated procedure well, VSS, report to RN

## 2024-08-02 NOTE — Progress Notes (Signed)
 Called to room to assist during endoscopic procedure.  Patient ID and intended procedure confirmed with present staff. Received instructions for my participation in the procedure from the performing physician.

## 2024-08-02 NOTE — Progress Notes (Unsigned)
 History and Physical:  This patient presents for endoscopic testing for: Encounter Diagnoses  Name Primary?   Rectal bleeding Yes   Nausea and vomiting, unspecified vomiting type    Constipation, unspecified constipation type     38 year old woman here today for EGD and colonoscopy to evaluate both upper and lower GI symptoms as outlined in the APP office note from 06/30/2024.  There have been no significant clinical changes since then.  Patient is otherwise without complaints or active issues today.   Past Medical History: Past Medical History:  Diagnosis Date   Anxiety    Complication of anesthesia    N/V, passing out with epidural   Headache(784.0)    Insomnia    Lumbar radiculopathy    Ovarian cyst    Pap smear abnormality of cervix with LGSIL    Preterm labor    Seizures (HCC)    Urinary tract infection      Past Surgical History: Past Surgical History:  Procedure Laterality Date   CYST EXCISION     lower back   LUMBAR LAMINECTOMY/DECOMPRESSION MICRODISCECTOMY Left 06/07/2020   Procedure: LEFT L5-S1 MICRODISCECTOMY;  Surgeon: Clois Fret, MD;  Location: [facility] ORS;  Service: Neurosurgery;  Laterality: Left;   LUMBAR LAMINECTOMY/DECOMPRESSION MICRODISCECTOMY Left 04/17/2022   Procedure: LEFT L5-S1 MICRODISCECTOMY;  Surgeon: Clois Fret, MD;  Location: [facility] ORS;  Service: Neurosurgery;  Laterality: Left;   repair of fr left foot     fracture   THERAPEUTIC ABORTION     TUBAL LIGATION  04/21/2011   Procedure: POST PARTUM TUBAL LIGATION;  Surgeon: Gloris DELENA Hugger, MD;  Location: [facility];  Service: Gynecology;  Laterality: Bilateral;  Bilateral post partum tubal ligation with filshie clips.   tubalectomy Bilateral 2014    Allergies: Allergies  Allergen Reactions   Contrave  [Naltrexone -Bupropion  Hcl Er] Nausea Only    irritability    Outpatient Meds: Current Outpatient Medications  Medication Sig Dispense Refill   pantoprazole  (PROTONIX ) 40 MG tablet  Take 1 tablet (40 mg total) by mouth daily. 90 tablet 1   LORazepam  (ATIVAN ) 0.5 MG tablet Take 1 tablet (0.5 mg total) by mouth daily as needed for anxiety. 20 tablet 1   tirzepatide  (ZEPBOUND ) 2.5 MG/0.5ML Pen Inject 2.5 mg into the skin once a week. (Patient not taking: Reported on 08/02/2024) 2 mL 0   [START ON 08/19/2024] tirzepatide  (ZEPBOUND ) 5 MG/0.5ML Pen Inject 5 mg into the skin once a week. (Patient not taking: Reported on 08/02/2024) 2 mL 2   Current Facility-Administered Medications  Medication Dose Route Frequency Provider Last Rate Last Admin   0.9 %  sodium chloride  infusion  500 mL Intravenous Once Danis, Zeth Buday L III, MD          ___________________________________________________________________ Objective   Exam:  BP 123/76   Pulse 85   Temp (!) 97.2 F (36.2 C)   Resp 12   Ht 5' 8 (1.727 m)   Wt 196 lb (88.9 kg)   SpO2 100%   BMI 29.80 kg/m   CV: regular , S1/S2 Resp: clear to auscultation bilaterally, normal RR and effort noted GI: soft, no tenderness, with active bowel sounds.   Assessment: Encounter Diagnoses  Name Primary?   Rectal bleeding Yes   Nausea and vomiting, unspecified vomiting type    Constipation, unspecified constipation type      Plan: Colonoscopy EGD  The benefits and risks of the planned procedure(s) were described in detail with the patient or (when appropriate) their health care proxy.  Risks were outlined as including, but not limited to, bleeding, infection, perforation, adverse medication reaction leading to cardiac or pulmonary decompensation, pancreatitis (if ERCP).  The limitation of incomplete mucosal visualization was also discussed.  No guarantees or warranties were given.  The patient was provided an opportunity to ask questions and all were answered. The patient agreed with the plan.   The patient is appropriate for an endoscopic procedure in the ambulatory setting.   - Victory Brand, MD

## 2024-08-02 NOTE — Op Note (Signed)
 [facility] Patient Name: [name] Procedure Date: 08/02/2024 3:19 PM MRN: [id number] Endoscopist: Victory L. Legrand , MD, [id number] Age: 38 Referring MD:  Date of Birth: [birth year] Gender: Female Account #: [id number] Procedure:                Colonoscopy Indications:              Rectal bleeding, Constipation Medicines:                Monitored Anesthesia Care Procedure:                Pre-Anesthesia Assessment:                           - Prior to the procedure, a History and Physical                            was performed, and patient medications and                            allergies were reviewed. The patient's tolerance of                            previous anesthesia was also reviewed. The risks                            and benefits of the procedure and the sedation                            options and risks were discussed with the patient.                            All questions were answered, and informed consent                            was obtained. Prior Anticoagulants: The patient has                            taken no anticoagulant or antiplatelet agents. ASA                            Grade Assessment: II - A patient with mild systemic                            disease. After reviewing the risks and benefits,                            the patient was deemed in satisfactory condition to                            undergo the procedure.                           After obtaining informed consent, the colonoscope  was passed under direct vision. Throughout the                            procedure, the patient's blood pressure, pulse, and                            oxygen saturations were monitored continuously. The                            Olympus Scope SN: [id number] was introduced through                            the anus and advanced to the the terminal ileum,                            with identification of the  appendiceal orifice and                            IC valve. The colonoscopy was performed with                            difficulty. The patient tolerated the procedure,                            though was difficult to sedate and required                            high-dose propofol  (800 mg). Frequent myoclonic                            jerks and abdominal breathing throughout the                            procedure.. The quality of the bowel preparation                            was excellent. The terminal ileum, ileocecal valve,                            appendiceal orifice, and rectum were photographed.                            The bowel preparation used was 2 day Suprep/Miralax. Scope In: 3:47:39 PM Scope Out: 4:07:05 PM Scope Withdrawal Time: 0 hours 16 minutes 30 seconds  Total Procedure Duration: 0 hours 19 minutes 26 seconds  Findings:                 The perianal and digital rectal examinations were                            normal.                           The terminal ileum appeared normal. (Intubated for  20 cm)                           A few diverticula were found in the left colon.                           An 18-20 mm polyp was found in the proximal rectum                            (approximately 12 to 14 cm from the anal verge).                            The polyp was multilobulated and pedunculated on a                            thick stalk. Tissue somewhat friable with scope                            passage. Visualization was challenging due to the                            polyp size, morphology, location, motility and                            sedation challenges noted above. Therefore, the                            decision was made not to remove the polyp during                            this procedure. Biopsies were taken of the polyp                            with a cold forceps for histology.                            Internal hemorrhoids were found. The hemorrhoids                            were small.                           The exam was otherwise without abnormality on                            direct and retroflexion views. Complications:            No immediate complications. Estimated Blood Loss:     Estimated blood loss was minimal. Impression:               - The examined portion of the ileum was normal.                           - Diverticulosis in the left colon.                           -  One 20 mm polyp in the proximal rectum. Biopsied.                           - Internal hemorrhoids.                           - The examination was otherwise normal on direct                            and retroflexion views. Recommendation:           - Patient has a contact number available for                            emergencies. The signs and symptoms of potential                            delayed complications were discussed with the                            patient. Return to normal activities tomorrow.                            Written discharge instructions were provided to the                            patient.                           - Resume previous diet.                           - Continue present medications.                           - Await pathology results.                           - Repeat colonoscopy in next available hospital                            outpatient block for advanced polypectomy under                            general anesthesia (see above). 2-day prep for that                            colonoscopy as well                           - See the other procedure note for documentation of                            additional recommendations. Temitope Griffing L. Legrand, MD 08/02/2024 4:24:42 PM This report has been signed electronically.

## 2024-08-02 NOTE — Op Note (Signed)
 [facility] Patient Name: [name] Procedure Date: 08/02/2024 3:09 PM MRN: [id number] Endoscopist: Victory L. Legrand , MD, [id number] Age: 38 Referring MD:  Date of Birth: 04/04/1986 Gender: Female Account #: [id number] Procedure:                Upper GI endoscopy Indications:              Nausea with vomiting                           Clinical details in recent office note                           Symptoms often occur in the morning, with patient                            bringing up partially digested food from the prior                            evening Medicines:                Monitored Anesthesia Care Procedure:                Pre-Anesthesia Assessment:                           - Prior to the procedure, a History and Physical                            was performed, and patient medications and                            allergies were reviewed. The patient's tolerance of                            previous anesthesia was also reviewed. The risks                            and benefits of the procedure and the sedation                            options and risks were discussed with the patient.                            All questions were answered, and informed consent                            was obtained. Prior Anticoagulants: The patient has                            taken no anticoagulant or antiplatelet agents. ASA                            Grade Assessment: II - A patient with mild systemic  disease. After reviewing the risks and benefits,                            the patient was deemed in satisfactory condition to                            undergo the procedure.                           After obtaining informed consent, the endoscope was                            passed under direct vision. Throughout the                            procedure, the patient's blood pressure, pulse, and                            oxygen  saturations were monitored continuously. The                            GIF [id number] was introduced through the                            mouth, and advanced to the second part of duodenum.                            The upper GI endoscopy was somewhat difficult due                            to abdominal breathing from high sedation dose                            required for prior procedure (see separate report). Scope In: Scope Out: Findings:                 The esophagus was normal.                           Normal mucosa was found in the entire examined                            stomach. Biopsies were taken with a cold forceps                            for histology. (Antrum and body in same pathology                            jar to rule out H. pylori)                           The cardia and gastric fundus were normal on  retroflexion. (Hill grade 3) The stomach distended                            well with insufflation, and the scope passed easily                            through a patent pylorus.                           The examined duodenum was normal. Complications:            No immediate complications. Estimated Blood Loss:     Estimated blood loss was minimal. Impression:               - Normal esophagus.                           - Normal mucosa was found in the entire stomach.                            Biopsied.                           - Normal examined duodenum. Recommendation:           - Patient has a contact number available for                            emergencies. The signs and symptoms of potential                            delayed complications were discussed with the                            patient. Return to normal activities tomorrow.                            Written discharge instructions were provided to the                            patient.                           - Resume previous diet.                            - Continue present medications.                           - Await pathology results.                           - See the other procedure note for documentation of                            additional recommendations.                           -  Do a gastric emptying study at appointment to be                            scheduled. Killian Schwer L. Legrand, MD 08/02/2024 4:29:10 PM This report has been signed electronically.

## 2024-08-03 ENCOUNTER — Telehealth: Payer: Self-pay | Admitting: *Deleted

## 2024-08-03 NOTE — Telephone Encounter (Signed)
  Follow up Call-     08/02/2024    2:57 PM  Call back number  Post procedure Call Back phone  # [phone]  Permission to leave phone message Yes    Left message to call back if any questions or concerns

## 2024-08-04 ENCOUNTER — Other Ambulatory Visit: Payer: Self-pay

## 2024-08-04 DIAGNOSIS — R112 Nausea with vomiting, unspecified: Secondary | ICD-10-CM

## 2024-08-05 LAB — SURGICAL PATHOLOGY

## 2024-08-08 ENCOUNTER — Ambulatory Visit: Payer: Self-pay | Admitting: Gastroenterology

## 2024-08-10 ENCOUNTER — Encounter (HOSPITAL_COMMUNITY)

## 2024-08-11 ENCOUNTER — Other Ambulatory Visit: Payer: Self-pay

## 2024-08-11 DIAGNOSIS — Z860100 Personal history of colon polyps, unspecified: Secondary | ICD-10-CM

## 2024-08-11 MED ORDER — NA SULFATE-K SULFATE-MG SULF 17.5-3.13-1.6 GM/177ML PO SOLN: 1.0000 | Freq: Once | ORAL | 0 refills | Status: AC

## 2024-08-11 NOTE — Telephone Encounter (Signed)
 Colon has been set up for 10/11/24 at 830 am at [facility] with HD   Colon scheduled, pt instructed and medications reviewed.  Patient instructions mailed to home.  Patient to call with any questions or concerns. She will pick up the prep in a week    The pt will call and reschedule [facility] for a date that works for her.    Appt made to see Harlene on 09/06/24.    The patient has been notified of this information and all questions answered.

## 2024-08-19 ENCOUNTER — Telehealth: Payer: Self-pay | Admitting: Family Medicine

## 2024-08-19 NOTE — Telephone Encounter (Unsigned)
 Copied from CRM #[id number]. Topic: Clinical - Prescription Issue >> Aug 19, 2024  1:30 PM [name] wrote: Reason for CRM: Patient had teleheath appt 11/20 and has [facility] her tirzepatide  (ZEPBOUND ) 2.5 MG/0.5ML Pen tirzepatide  (ZEPBOUND ) 5 MG/0.5ML Pen .[name] Keep stating pending?  Pls review for patient and call her back w/update on what she needs to do?

## 2024-08-19 NOTE — Telephone Encounter (Signed)
 PA team- can you guys check on a prior authorization for the patient's medication? I do not see any notes in the patients chart.

## 2024-08-20 ENCOUNTER — Telehealth: Payer: Self-pay

## 2024-08-20 ENCOUNTER — Other Ambulatory Visit (HOSPITAL_COMMUNITY): Payer: Self-pay

## 2024-08-20 NOTE — Telephone Encounter (Signed)
 Pharmacy Patient Advocate Encounter   Received notification from Pt Calls Messages that prior authorization for Zepbound  2.5MG /0.5ML pen-injectors is required/requested.   Insurance verification completed.   The patient is insured through [facility]   Per test claim: PA required; PA submitted to above mentioned insurance via Latent Key/confirmation #/EOC [id number] Status is pending

## 2024-08-20 NOTE — Telephone Encounter (Signed)
 Pharmacy Patient Advocate Encounter  Received notification from [facility] that Prior Authorization for Zepbound  2.5MG /0.5ML pen-injectors  has been APPROVED from 08/20/24 to 04/17/25. Ran test claim, Copay is $1,175.30. This test claim was processed through [facility]- copay amounts may vary at other pharmacies due to pharmacy/plan contracts, or as the patient moves through the different stages of their insurance plan.   PA #/Case ID/Reference #: [id number]  Please have patient reach out to insurance if she has any questions regarding copay.

## 2024-09-06 NOTE — Telephone Encounter (Unsigned)
 Copied from CRM [id number]. Topic: Clinical - Medication Prior Auth >> Sep 06, 2024  1:13 PM Ivette P wrote: Reason for CRM: Pt called in about medication tirzepatide  (ZEPBOUND ) 5 MG/0.5ML Pen   Pharmacy is not fulfilling due to needing PA and pt has yet to pick up medication

## 2024-09-07 ENCOUNTER — Ambulatory Visit: Admitting: Gastroenterology

## 2024-09-17 ENCOUNTER — Ambulatory Visit: Admitting: Family Medicine

## 2024-09-28 ENCOUNTER — Encounter (HOSPITAL_COMMUNITY): Payer: Self-pay | Admitting: Gastroenterology

## 2024-10-05 ENCOUNTER — Telehealth: Payer: Self-pay | Admitting: Gastroenterology

## 2024-10-05 NOTE — Telephone Encounter (Addendum)
 Procedure:Colonoscopy Procedure date: 10/11/24 Procedure location: [facility] Arrival Time: 7:00 am Spoke with the patient Y/N: Yes Any prep concerns? No  Has the patient obtained the prep from the pharmacy ? NA Do you have a care partner and transportation: NA Any additional concerns? Pt states that she want to cancel the appointment and she will call back to reschedule.

## 2024-10-05 NOTE — Telephone Encounter (Signed)
 The pt appt has been cancelled. She will call back when/if ready

## 2024-10-11 ENCOUNTER — Encounter (HOSPITAL_COMMUNITY): Admission: RE | Payer: Self-pay | Source: Home / Self Care

## 2024-10-11 ENCOUNTER — Ambulatory Visit (HOSPITAL_COMMUNITY): Admission: RE | Admit: 2024-10-11 | Source: Home / Self Care | Admitting: Gastroenterology

## 2024-10-18 ENCOUNTER — Ambulatory Visit: Admitting: Family Medicine
